# Patient Record
Sex: Female | Born: 1965 | Race: Black or African American | Hispanic: No | Marital: Married | Smoking: Never smoker
Health system: Southern US, Community
[De-identification: ages and names within clinical notes are randomized; demographics above are authoritative.]

## PROBLEM LIST (undated history)

## (undated) DIAGNOSIS — J302 Other seasonal allergic rhinitis: Secondary | ICD-10-CM

## (undated) DIAGNOSIS — K219 Gastro-esophageal reflux disease without esophagitis: Secondary | ICD-10-CM

## (undated) DIAGNOSIS — I10 Essential (primary) hypertension: Secondary | ICD-10-CM

## (undated) DIAGNOSIS — K279 Peptic ulcer, site unspecified, unspecified as acute or chronic, without hemorrhage or perforation: Secondary | ICD-10-CM

## (undated) DIAGNOSIS — K760 Fatty (change of) liver, not elsewhere classified: Secondary | ICD-10-CM

## (undated) DIAGNOSIS — T7840XA Allergy, unspecified, initial encounter: Secondary | ICD-10-CM

## (undated) DIAGNOSIS — K824 Cholesterolosis of gallbladder: Secondary | ICD-10-CM

## (undated) DIAGNOSIS — R413 Other amnesia: Secondary | ICD-10-CM

## (undated) HISTORY — DX: Fatty (change of) liver, not elsewhere classified: K76.0

## (undated) HISTORY — DX: Cholesterolosis of gallbladder: K82.4

## (undated) HISTORY — PX: REDUCTION MAMMAPLASTY: SUR839

## (undated) HISTORY — DX: Allergy, unspecified, initial encounter: T78.40XA

## (undated) HISTORY — DX: Other seasonal allergic rhinitis: J30.2

## (undated) HISTORY — DX: Other amnesia: R41.3

## (undated) HISTORY — DX: Essential (primary) hypertension: I10

## (undated) HISTORY — DX: Peptic ulcer, site unspecified, unspecified as acute or chronic, without hemorrhage or perforation: K27.9

## (undated) HISTORY — PX: ABDOMINAL HYSTERECTOMY: SHX81

## (undated) HISTORY — PX: PARTIAL HYSTERECTOMY: SHX80

## (undated) HISTORY — DX: Gastro-esophageal reflux disease without esophagitis: K21.9

---

## 1998-03-27 ENCOUNTER — Ambulatory Visit (HOSPITAL_BASED_OUTPATIENT_CLINIC_OR_DEPARTMENT_OTHER): Admission: RE | Admit: 1998-03-27 | Discharge: 1998-03-27 | Payer: Self-pay | Admitting: Specialist

## 2002-07-13 ENCOUNTER — Emergency Department (HOSPITAL_COMMUNITY): Admission: EM | Admit: 2002-07-13 | Discharge: 2002-07-13 | Payer: Self-pay | Admitting: *Deleted

## 2004-03-19 ENCOUNTER — Ambulatory Visit (HOSPITAL_COMMUNITY): Admission: RE | Admit: 2004-03-19 | Discharge: 2004-03-19 | Payer: Self-pay | Admitting: General Surgery

## 2005-01-07 HISTORY — PX: BREAST REDUCTION SURGERY: SHX8

## 2006-07-30 ENCOUNTER — Emergency Department (HOSPITAL_COMMUNITY): Admission: EM | Admit: 2006-07-30 | Discharge: 2006-07-30 | Payer: Self-pay | Admitting: Emergency Medicine

## 2007-03-24 ENCOUNTER — Emergency Department (HOSPITAL_COMMUNITY): Admission: EM | Admit: 2007-03-24 | Discharge: 2007-03-24 | Payer: Self-pay | Admitting: Emergency Medicine

## 2007-04-15 ENCOUNTER — Emergency Department (HOSPITAL_COMMUNITY): Admission: EM | Admit: 2007-04-15 | Discharge: 2007-04-15 | Payer: Self-pay | Admitting: Emergency Medicine

## 2008-10-20 ENCOUNTER — Emergency Department (HOSPITAL_COMMUNITY): Admission: EM | Admit: 2008-10-20 | Discharge: 2008-10-20 | Payer: Self-pay | Admitting: Emergency Medicine

## 2009-01-07 DIAGNOSIS — K279 Peptic ulcer, site unspecified, unspecified as acute or chronic, without hemorrhage or perforation: Secondary | ICD-10-CM

## 2009-01-07 HISTORY — DX: Peptic ulcer, site unspecified, unspecified as acute or chronic, without hemorrhage or perforation: K27.9

## 2009-11-22 ENCOUNTER — Ambulatory Visit: Payer: Self-pay | Admitting: Cardiology

## 2009-11-22 ENCOUNTER — Observation Stay (HOSPITAL_COMMUNITY)
Admission: EM | Admit: 2009-11-22 | Discharge: 2009-11-24 | Payer: Self-pay | Source: Home / Self Care | Admitting: Emergency Medicine

## 2009-11-23 ENCOUNTER — Encounter (INDEPENDENT_AMBULATORY_CARE_PROVIDER_SITE_OTHER): Payer: Self-pay | Admitting: Internal Medicine

## 2009-11-23 ENCOUNTER — Ambulatory Visit: Payer: Self-pay | Admitting: Internal Medicine

## 2009-11-23 HISTORY — PX: ESOPHAGOGASTRODUODENOSCOPY: SHX1529

## 2010-01-07 DIAGNOSIS — K824 Cholesterolosis of gallbladder: Secondary | ICD-10-CM

## 2010-01-07 HISTORY — DX: Cholesterolosis of gallbladder: K82.4

## 2010-01-28 ENCOUNTER — Encounter: Payer: Self-pay | Admitting: General Surgery

## 2010-03-20 LAB — BASIC METABOLIC PANEL
CO2: 28 mEq/L (ref 19–32)
Calcium: 9.8 mg/dL (ref 8.4–10.5)
Chloride: 104 mEq/L (ref 96–112)
Creatinine, Ser: 0.95 mg/dL (ref 0.4–1.2)
Creatinine, Ser: 1.1 mg/dL (ref 0.4–1.2)
GFR calc Af Amer: >60 mL/min (ref >60)
GFR calc Af Amer: >60 mL/min (ref >60)
GFR calc non Af Amer: >60 mL/min (ref >60)
Potassium: 3.3 mEq/L — ABNORMAL LOW (ref 3.5–5.1)
Sodium: 139 mEq/L (ref 135–145)

## 2010-03-20 LAB — CARDIAC PANEL(CRET KIN+CKTOT+MB+TROPI)
CK, MB: 0.8 ng/mL (ref 0.3–4.0)
Relative Index: 0.3 (ref 0.0–2.5)
Relative Index: 0.4 (ref 0.0–2.5)
Total CK: 200 U/L — ABNORMAL HIGH (ref 7–177)
Troponin I: 0.02 ng/mL (ref 0.00–0.06)
Troponin I: 0.04 ng/mL (ref 0.00–0.06)
Troponin I: 0.04 ng/mL (ref 0.00–0.06)

## 2010-03-20 LAB — CBC
HCT: 33.7 % — ABNORMAL LOW (ref 36.0–46.0)
Hemoglobin: 13.2 g/dL (ref 12.0–15.0)
MCH: 28.1 pg (ref 26.0–34.0)
MCHC: 33.6 g/dL (ref 30.0–36.0)
MCV: 83.6 fL (ref 78.0–100.0)
Platelets: 230 10*3/uL (ref 150–400)
Platelets: 254 10*3/uL (ref 150–400)
RBC: 4.72 MIL/uL (ref 3.87–5.11)
RDW: 12 % (ref 11.5–15.5)

## 2010-03-20 LAB — DIFFERENTIAL
Basophils Absolute: 0 10*3/uL (ref 0.0–0.1)
Basophils Relative: 1 % (ref 0–1)
Basophils Relative: 1 % (ref 0–1)
Lymphocytes Relative: 36 % (ref 12–46)
Lymphs Abs: 1.9 10*3/uL (ref 0.7–4.0)
Monocytes Relative: 10 % (ref 3–12)
Monocytes Relative: 11 % (ref 3–12)
Neutro Abs: 1.8 10*3/uL (ref 1.7–7.7)
Neutro Abs: 2 10*3/uL (ref 1.7–7.7)
Neutrophils Relative %: 39 % — ABNORMAL LOW (ref 43–77)
Neutrophils Relative %: 46 % (ref 43–77)

## 2010-03-20 LAB — COMPREHENSIVE METABOLIC PANEL
Albumin: 3.5 g/dL (ref 3.5–5.2)
Alkaline Phosphatase: 45 U/L (ref 39–117)
BUN: 13 mg/dL (ref 6–23)
Calcium: 8.5 mg/dL (ref 8.4–10.5)
Creatinine, Ser: 0.91 mg/dL (ref 0.4–1.2)
Glucose, Bld: 121 mg/dL — ABNORMAL HIGH (ref 70–99)
Potassium: 3.7 mEq/L (ref 3.5–5.1)
Total Protein: 6.4 g/dL (ref 6.0–8.3)

## 2010-03-20 LAB — HEPATIC FUNCTION PANEL
Albumin: 3.7 g/dL (ref 3.5–5.2)
Alkaline Phosphatase: 49 U/L (ref 39–117)
Bilirubin, Direct: 0.1 mg/dL (ref 0.0–0.3)
Indirect Bilirubin: 1 mg/dL — ABNORMAL HIGH (ref 0.3–0.9)
Total Bilirubin: 1.1 mg/dL (ref 0.3–1.2)

## 2010-03-20 LAB — LIPASE, BLOOD: Lipase: 35 U/L (ref 11–59)

## 2010-03-20 LAB — TSH: TSH: 2.58 u[IU]/mL (ref 0.350–4.500)

## 2010-03-20 LAB — POCT CARDIAC MARKERS
CKMB, poc: <1 ng/mL — ABNORMAL LOW (ref 1.0–8.0)
Myoglobin, poc: 87.2 ng/mL (ref 12–200)

## 2010-03-20 LAB — D-DIMER, QUANTITATIVE: D-Dimer, Quant: 0.22 ug/mL-FEU (ref 0.00–0.48)

## 2010-04-12 LAB — URINALYSIS, ROUTINE W REFLEX MICROSCOPIC
Glucose, UA: NEGATIVE mg/dL
Leukocytes, UA: NEGATIVE
pH: 6 (ref 5.0–8.0)

## 2010-04-12 LAB — URINE MICROSCOPIC-ADD ON

## 2011-07-17 ENCOUNTER — Ambulatory Visit (INDEPENDENT_AMBULATORY_CARE_PROVIDER_SITE_OTHER): Payer: Managed Care, Other (non HMO) | Admitting: Family Medicine

## 2011-07-17 ENCOUNTER — Encounter: Payer: Self-pay | Admitting: Family Medicine

## 2011-07-17 VITALS — BP 130/96 | HR 97 | Resp 16 | Ht 60.0 in | Wt 161.4 lb

## 2011-07-17 DIAGNOSIS — D649 Anemia, unspecified: Secondary | ICD-10-CM

## 2011-07-17 DIAGNOSIS — R232 Flushing: Secondary | ICD-10-CM

## 2011-07-17 DIAGNOSIS — I10 Essential (primary) hypertension: Secondary | ICD-10-CM

## 2011-07-17 DIAGNOSIS — N951 Menopausal and female climacteric states: Secondary | ICD-10-CM

## 2011-07-17 DIAGNOSIS — E669 Obesity, unspecified: Secondary | ICD-10-CM

## 2011-07-17 LAB — LIPID PANEL
HDL: 52 mg/dL (ref >39)
LDL Cholesterol: 132 mg/dL — ABNORMAL HIGH (ref 0–99)
Total CHOL/HDL Ratio: 4 Ratio
Triglycerides: 123 mg/dL (ref <150)

## 2011-07-17 LAB — CBC
HCT: 45.3 % (ref 36.0–46.0)
MCV: 80.7 fL (ref 78.0–100.0)
Platelets: 337 10*3/uL (ref 150–400)
RBC: 5.61 MIL/uL — ABNORMAL HIGH (ref 3.87–5.11)
RDW: 13.6 % (ref 11.5–15.5)
WBC: 3.8 10*3/uL — ABNORMAL LOW (ref 4.0–10.5)

## 2011-07-17 LAB — COMPREHENSIVE METABOLIC PANEL
ALT: 158 U/L — ABNORMAL HIGH (ref 0–35)
Calcium: 10.7 mg/dL — ABNORMAL HIGH (ref 8.4–10.5)
Potassium: 4.3 mEq/L (ref 3.5–5.3)
Sodium: 135 mEq/L (ref 135–145)

## 2011-07-17 MED ORDER — LISINOPRIL-HYDROCHLOROTHIAZIDE 10-12.5 MG PO TABS: 1.0000 | ORAL_TABLET | Freq: Every day | ORAL | Status: DC

## 2011-07-17 NOTE — Assessment & Plan Note (Signed)
Hold on estrogen replacement, discussed MVI and Estroven vitamin which has some black cohosh in it with other essential vitamins

## 2011-07-17 NOTE — Patient Instructions (Addendum)
F/U 4 week  For blood pressure  We will call with lab results Start new blood pressure medication  Vitamin D 800IU  Calcium 1200mg = Or try Jeffie Pollock

## 2011-07-17 NOTE — Assessment & Plan Note (Signed)
Check CBC 

## 2011-07-17 NOTE — Progress Notes (Signed)
  Subjective:    Patient ID: Adriana Ramsey, female    DOB: [birth year], 46 y.o.   MRN: [id number]  HPI Pt here to establish care, last PCP Dr. Loleta Chance > 4 years ago History of HTN was put on meds for a few months by her GYN but then had no follow-up. Has had increased stress at work- works at [employer], she is the [employer], she noticed her blood pressure has been elevated and she has been having Headaches, last week BP was 167/106. In the past was on maxide.  EMR reviewed, pt also has history of gastric ulcers secondary to NSAID use , hiatal hernia and mild anemia She currently is suffering from hot flashes for past year on and off. S/p hysterectomy> 10 years ago. Overdue for physical and Mammogram    Review of Systems  GEN- denies fatigue, fever, weight loss,weakness, recent illness HEENT- denies eye drainage, change in vision, nasal discharge, CVS- denies chest pain, palpitations RESP- denies SOB, cough, wheeze ABD- denies N/V, change in stools, abd pain GU- denies dysuria, hematuria, dribbling, incontinence MSK- denies joint pain, muscle aches, injury Neuro- + headache, dizziness, syncope, seizure activity       Objective:   Physical Exam GEN- NAD, alert and oriented x3 HEENT- PERRL, EOMI, non injected sclera, pink conjunctiva, MMM, oropharynx clear Neck- Supple, no thyromegaly CVS- RRR, no murmur RESP-CTAB ABD-NABS,soft,NT,ND EXT- No edema Pulses- Radial, DP- 2+ EKG-NSR, non specific T wave change- flipped t wave AVL        Assessment & Plan:

## 2011-07-17 NOTE — Assessment & Plan Note (Signed)
She wants to try a diet pill, baseline labs to be done, when BP is controlled she may be a good candidate for phentermine, discussed need for healthy exercise and diet

## 2011-07-17 NOTE — Assessment & Plan Note (Signed)
Start lisinopril HCTZ 

## 2011-07-19 ENCOUNTER — Telehealth: Payer: Self-pay | Admitting: Family Medicine

## 2011-07-19 DIAGNOSIS — R748 Abnormal levels of other serum enzymes: Secondary | ICD-10-CM

## 2011-07-19 NOTE — Telephone Encounter (Signed)
I have spoken with pt she tells me she has been taking upwards of 9 capusules of tylenol a day over the past few weeks for headache Labs to be done Stop acetaminophen Ultrasound to be done

## 2011-07-20 LAB — HEPATITIS B SURFACE ANTIGEN: Hepatitis B Surface Ag: NEGATIVE

## 2011-07-20 LAB — HEPATITIS C ANTIBODY: HCV Ab: NEGATIVE

## 2011-07-20 LAB — COMPREHENSIVE METABOLIC PANEL
BUN: 16 mg/dL (ref 6–23)
CO2: 28 mEq/L (ref 19–32)
Calcium: 10.1 mg/dL (ref 8.4–10.5)
Chloride: 101 mEq/L (ref 96–112)
Creat: 0.8 mg/dL (ref 0.50–1.10)
Total Bilirubin: 0.6 mg/dL (ref 0.3–1.2)

## 2011-07-20 LAB — HEPATITIS A ANTIBODY, IGM: Hep A IgM: NEGATIVE

## 2011-07-20 LAB — PROTIME-INR: Prothrombin Time: 12.8 seconds (ref 11.6–15.2)

## 2011-07-22 ENCOUNTER — Telehealth: Payer: Self-pay | Admitting: Family Medicine

## 2011-07-22 DIAGNOSIS — R748 Abnormal levels of other serum enzymes: Secondary | ICD-10-CM

## 2011-07-22 DIAGNOSIS — R7301 Impaired fasting glucose: Secondary | ICD-10-CM

## 2011-07-22 NOTE — Telephone Encounter (Signed)
Spoke with pt and she is aware of lab results and well as the need to go and have repeat labs drawn.

## 2011-07-22 NOTE — Telephone Encounter (Signed)
Please give message on labs CMET/A1C to be done Friday

## 2011-07-26 ENCOUNTER — Telehealth: Payer: Self-pay | Admitting: Family Medicine

## 2011-07-26 LAB — HEPATIC FUNCTION PANEL
ALT: 77 U/L — ABNORMAL HIGH (ref 0–35)
AST: 39 U/L — ABNORMAL HIGH (ref 0–37)
Alkaline Phosphatase: 77 U/L (ref 39–117)
Indirect Bilirubin: 0.4 mg/dL (ref 0.0–0.9)
Total Protein: 7.4 g/dL (ref 6.0–8.3)

## 2011-07-26 MED ORDER — HYDROCHLOROTHIAZIDE 25 MG PO TABS: 25.0000 mg | ORAL_TABLET | Freq: Every day | ORAL | Status: DC

## 2011-07-26 NOTE — Telephone Encounter (Signed)
Pt called after hours line, I had not called her. She has labs drawn She has a dry hacking cough with lisinopril, she will finish out the current pack, I will send over HCTZ 25mg  instead

## 2011-07-31 ENCOUNTER — Encounter: Payer: Self-pay | Admitting: Family Medicine

## 2011-08-02 ENCOUNTER — Ambulatory Visit (HOSPITAL_COMMUNITY): Admission: RE | Admit: 2011-08-02 | Payer: Managed Care, Other (non HMO) | Source: Ambulatory Visit

## 2011-08-15 ENCOUNTER — Ambulatory Visit: Payer: Self-pay | Admitting: Family Medicine

## 2011-10-04 ENCOUNTER — Telehealth: Payer: Self-pay | Admitting: Family Medicine

## 2011-10-09 NOTE — Telephone Encounter (Signed)
Has appt on fri

## 2011-10-11 ENCOUNTER — Encounter: Payer: Self-pay | Admitting: Family Medicine

## 2011-10-11 ENCOUNTER — Ambulatory Visit (INDEPENDENT_AMBULATORY_CARE_PROVIDER_SITE_OTHER): Payer: Managed Care, Other (non HMO) | Admitting: Family Medicine

## 2011-10-11 VITALS — BP 120/82 | HR 90 | Resp 15 | Ht 60.0 in | Wt 162.0 lb

## 2011-10-11 DIAGNOSIS — I10 Essential (primary) hypertension: Secondary | ICD-10-CM

## 2011-10-11 DIAGNOSIS — E669 Obesity, unspecified: Secondary | ICD-10-CM

## 2011-10-11 DIAGNOSIS — R7989 Other specified abnormal findings of blood chemistry: Secondary | ICD-10-CM | POA: Insufficient documentation

## 2011-10-11 DIAGNOSIS — T781XXA Other adverse food reactions, not elsewhere classified, initial encounter: Secondary | ICD-10-CM

## 2011-10-11 DIAGNOSIS — Z91018 Allergy to other foods: Secondary | ICD-10-CM

## 2011-10-11 LAB — COMPREHENSIVE METABOLIC PANEL
AST: 43 U/L — ABNORMAL HIGH (ref 0–37)
Alkaline Phosphatase: 71 U/L (ref 39–117)
BUN: 13 mg/dL (ref 6–23)
Calcium: 9.6 mg/dL (ref 8.4–10.5)
Creat: 0.7 mg/dL (ref 0.50–1.10)
Total Bilirubin: 0.5 mg/dL (ref 0.3–1.2)

## 2011-10-11 MED ORDER — LISINOPRIL-HYDROCHLOROTHIAZIDE 10-12.5 MG PO TABS: 1.0000 | ORAL_TABLET | Freq: Every day | ORAL | Status: DC

## 2011-10-11 MED ORDER — EPINEPHRINE 0.3 MG/0.3ML IJ DEVI: 0.3000 mg | Freq: Once | INTRAMUSCULAR | Status: DC

## 2011-10-11 NOTE — Assessment & Plan Note (Signed)
Repeat LFT, if still elevated pt understands ultrasound will be needed, Hepatitis labs normal

## 2011-10-11 NOTE — Progress Notes (Signed)
  Subjective:    Patient ID: Adriana Ramsey, female    DOB: 10-30-1965, 46 y.o.   MRN: [id number]  HPI Pt here to f/u chronic medical problems. Treated for bronchitis at urgent care with Zpak Still has mild cough. She was started on lisinopril HCTZ however had dry cough therefore was told to stop his intake HCTZ 25 mg. She however that the lisinopril was doing well and decided to continue taking it. Previously elevated liver function tests which were trending down and she stopped the use of acetaminophen. Should not have liver ultrasound done and has not had a repeat labs She's a history of food allergies however she cannot pinpoint specifics as she is able to eat strawberries but has allergy to strawberries soda or strawberry preservatives she's also had instances with spaghetti sauce and seafood. She typically takes Benadryl before she eats new things as she is unsure when she will get facial and lip swelling. She is asking for EpiPen today.  Review of Systems  GEN- denies fatigue, fever, weight loss,weakness, recent illness HEENT- denies eye drainage, change in vision, nasal discharge, CVS- denies chest pain, palpitations RESP- denies SOB,+ cough, wheeze ABD- denies N/V, change in stools, abd pain GU- denies dysuria, hematuria, dribbling, incontinence MSK- denies joint pain, muscle aches, injury Neuro- denies headache, dizziness, syncope, seizure activity      Objective:   Physical Exam  GEN- NAD, alert and oriented x3 HEENT- PERRL, EOMI, non injected sclera, pink conjunctiva, MMM, oropharynx clear Neck- Supple, no LAD CVS- RRR, no murmur RESP-CTAB ABD-NABS,soft,no HSM,NT EXT- No edema Pulses- Radial, DP- 2+       Assessment & Plan:

## 2011-10-11 NOTE — Assessment & Plan Note (Signed)
Well-controlled we'll continue lisinopril HCTZ at patient request even though she's continues to have a cough every now and then, she states it is much improved

## 2011-10-11 NOTE — Assessment & Plan Note (Signed)
Unchanged, encourage exercise 

## 2011-10-11 NOTE — Patient Instructions (Signed)
Continue blood pressure pill Epi pen prescribed Get flu shot at work Labs today for liver F/U January for physical

## 2011-10-11 NOTE — Assessment & Plan Note (Signed)
Difficult as he only has allergies to certain brands of foods however she never knows when she will have facial swelling. I advised her to keep Benadryl with her at all times will use an EpiPen. At this time she wishes to hold on allergy testing

## 2011-10-15 NOTE — Addendum Note (Signed)
Addended by: Milinda Antis F on: 10/15/2011 10:37 AM   Modules accepted: Orders

## 2011-10-17 ENCOUNTER — Ambulatory Visit (HOSPITAL_COMMUNITY): Payer: Managed Care, Other (non HMO) | Attending: Family Medicine

## 2011-10-29 ENCOUNTER — Telehealth: Payer: Self-pay

## 2011-10-29 MED ORDER — PANTOPRAZOLE SODIUM 40 MG PO TBEC: 40.0000 mg | DELAYED_RELEASE_TABLET | Freq: Every day | ORAL | Status: DC

## 2011-10-29 NOTE — Telephone Encounter (Signed)
Noted  

## 2011-10-29 NOTE — Telephone Encounter (Signed)
meds sent

## 2011-10-29 NOTE — Telephone Encounter (Signed)
Pt wants an rx for protonix sent to [facility]. She used to take it but ran out and forgot to ask you for more when she was here.

## 2011-11-01 ENCOUNTER — Ambulatory Visit (HOSPITAL_COMMUNITY)
Admission: RE | Admit: 2011-11-01 | Discharge: 2011-11-01 | Disposition: A | Discharge status: Home / Self Care | Payer: Managed Care, Other (non HMO) | Source: Ambulatory Visit | Attending: Family Medicine | Admitting: Family Medicine

## 2011-11-01 ENCOUNTER — Other Ambulatory Visit: Payer: Self-pay | Admitting: Family Medicine

## 2011-11-01 DIAGNOSIS — R7989 Other specified abnormal findings of blood chemistry: Secondary | ICD-10-CM | POA: Insufficient documentation

## 2011-11-01 DIAGNOSIS — I10 Essential (primary) hypertension: Secondary | ICD-10-CM | POA: Insufficient documentation

## 2011-11-01 DIAGNOSIS — K824 Cholesterolosis of gallbladder: Secondary | ICD-10-CM

## 2011-11-07 ENCOUNTER — Encounter: Payer: Self-pay | Admitting: Internal Medicine

## 2011-11-11 ENCOUNTER — Telehealth: Payer: Self-pay | Admitting: Family Medicine

## 2011-11-11 NOTE — Telephone Encounter (Signed)
Patient aware.

## 2011-11-11 NOTE — Telephone Encounter (Signed)
Called and left voicemail for patient to return call as to why GI referral has been made.

## 2011-11-14 ENCOUNTER — Encounter: Payer: Self-pay | Admitting: Internal Medicine

## 2011-11-15 ENCOUNTER — Telehealth: Payer: Self-pay | Admitting: *Deleted

## 2011-11-15 ENCOUNTER — Ambulatory Visit: Payer: Managed Care, Other (non HMO) | Admitting: Urgent Care

## 2011-11-15 NOTE — Telephone Encounter (Signed)
Pt was a no show

## 2011-11-15 NOTE — Telephone Encounter (Signed)
Please attempt to reschedule

## 2011-11-18 NOTE — Telephone Encounter (Signed)
Left message for pt to call and reschedule

## 2011-11-27 ENCOUNTER — Ambulatory Visit: Payer: Managed Care, Other (non HMO) | Admitting: Urgent Care

## 2011-11-29 ENCOUNTER — Ambulatory Visit (INDEPENDENT_AMBULATORY_CARE_PROVIDER_SITE_OTHER): Payer: Managed Care, Other (non HMO) | Admitting: Urgent Care

## 2011-11-29 ENCOUNTER — Encounter: Payer: Self-pay | Admitting: Urgent Care

## 2011-11-29 VITALS — BP 133/87 | HR 87 | Temp 98.2°F | Ht 60.0 in | Wt 160.8 lb

## 2011-11-29 DIAGNOSIS — R1310 Dysphagia, unspecified: Secondary | ICD-10-CM

## 2011-11-29 DIAGNOSIS — K824 Cholesterolosis of gallbladder: Secondary | ICD-10-CM | POA: Insufficient documentation

## 2011-11-29 DIAGNOSIS — K7689 Other specified diseases of liver: Secondary | ICD-10-CM

## 2011-11-29 DIAGNOSIS — K760 Fatty (change of) liver, not elsewhere classified: Secondary | ICD-10-CM | POA: Insufficient documentation

## 2011-11-29 MED ORDER — DEXLANSOPRAZOLE 60 MG PO CPDR: 60.0000 mg | DELAYED_RELEASE_CAPSULE | Freq: Every day | ORAL | Status: DC

## 2011-11-29 NOTE — Assessment & Plan Note (Signed)
Chronic GERD with breakthrough symptoms on Protonix 40 mg daily. She also has mild, intermittent solid food dysphagia in setting of known Schatzki's ring.   TRIAL DEXILANT 60mg  daily Stop Protonix. If still with reflux & trouble swallowing, you will need an EGD (upper endoscopy) & dilation of your esophagus.  Please call to arrange. Office visit in 3 months

## 2011-11-29 NOTE — Progress Notes (Signed)
Referring Provider: Salley Scarlet, MD Primary Care Physician:  Milinda Antis, MD Primary Gastroenterologist:  Dr. Jonette Eva  Chief Complaint  Patient presents with  . abnormal LFT'S    Dysphagia (Schatzki's ring), GERD, gallbladder polyp    HPI:  Adriana Ramsey is a 46 y.o. female here as a referral from Dr. Jeanice Lim for elevated LFTS.  She gives history of taking too much tylenol.  She was taking 4 or more 500mg  tablets daily for 1 week for right shoulder pain.  She was seen by Dr Jeanice Lim to establish care.  She rated the shoulder pain to be 7/10 & describes it as intermittent.  Her liver function tests were incidentally found to be elevated.  She was in found to have a 3 mm gallbladder polyp on abdominal ultrasound.  There is no family history of liver disease.  She has been having some heartburn & chest pain with globus despite Protonix 40 mg daily for the last 2 years.  She has history of peptic ulcer disease, hiatal hernia, and Schatzki's ring on last EGD by Dr. Karilyn Cota in 2011. At that time she was taking ibuprofen frequently. Her weight has been stable over the last year. She denies any history of diabetes.  Her appetite has been fine.  Denies fever or chills.  She has had intermittent nausea & belching. Denies vomiting. Feels like her Protonix 40mg  daily he is really not working anymore. She does have mild intermittent solid food dysphagia.  Denies regurgitation. Denies odynophagia. Denies any problems with liquids. She has had very rare episodes of dysphagia. BMs are normal.  Denies rectal bleeding, melena, or clay colored stools.  Denies hx of jaundice.  She has had 1 blood transfusion at time of hysterectomy.  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  She has never been told she had hepatits.  Hgb a1c 5.9, acet level <15, Hep BsAg negative, HCV ab negative, Hep A IgM negative in July 2013.  ABD US->Probable 3 mm gallbladder polyp.  Question mild  fatty infiltration of liver.  Component     Latest Ref Rng 11/22/2009 11/23/2009 11/24/2009 07/17/2011  Total Protein     6.0 - 8.3 g/dL  6.8 6.4 8.6 (H)  Albumin     3.5 - 5.2 g/dL  3.7 3.5 5.2  AST     0 - 37 U/L  24 25 100 (H)  ALT     0 - 35 U/L  40 (H) 34 158 (H)  Alkaline Phosphatase     39 - 117 U/L  49 45 95  Total Bilirubin     0.3 - 1.2 mg/dL  1.1 0.4 0.6   Component     Latest Ref Rng 07/19/2011 07/26/2011 10/11/2011  Total Protein     6.0 - 8.3 g/dL 7.6 7.4 7.1  Albumin     3.5 - 5.2 g/dL 4.7 4.5 4.3  AST     0 - 37 U/L 52 (H) 39 (H) 43 (H)  ALT     0 - 35 U/L 113 (H) 77 (H) 60 (H)  Alkaline Phosphatase     39 - 117 U/L 92 77 71  Total Bilirubin     0.3 - 1.2 mg/dL 0.6 0.5 0.5    Past Medical History  Diagnosis Date  . Hypertension   . PUD (peptic ulcer disease) 2011  . Allergy   . GERD (gastroesophageal reflux disease)     Past Surgical  History  Procedure Date  . Breast reduction surgery 2007  . Abdominal hysterectomy     partial   . Cesarean section     2  . Esophagogastroduodenoscopy 11/23/2009    NUR: hypopharyngeal erosions of unclear sidnificance/mild changes of reflux /small sliding hiatal hernia and non Schatzki ring/3-mm gastric ulcer along the antrum with 2 scars and few erosions    Current Outpatient Prescriptions  Medication Sig Dispense Refill  . EPINEPHrine (EPI-PEN) 0.3 mg/0.3 mL DEVI Inject 0.3 mLs (0.3 mg total) into the muscle once.  2 Device  1  . lisinopril-hydrochlorothiazide (PRINZIDE,ZESTORETIC) 10-12.5 MG per tablet Take 1 tablet by mouth daily.  30 tablet  6  . pantoprazole (PROTONIX) 40 MG tablet Take 1 tablet (40 mg total) by mouth daily.  30 tablet  3  . dexlansoprazole (DEXILANT) 60 MG capsule Take 1 capsule (60 mg total) by mouth daily.  14 capsule  0    Allergies as of 11/29/2011 - Review Complete 11/29/2011  Allergen Reaction Noted  . Sulfur Swelling 07/17/2011    Family History:There is no known family history  of colorectal carcinoma , liver disease, or inflammatory bowel disease.  Problem Relation Age of Onset  . Hyperlipidemia Mother   . Seizures Mother     History   Social History  . Marital Status: Married    Spouse Name: N/A    Number of Children: 2  . Years of Education: N/A   Occupational History  . billing specialist [facility]    Social History Main Topics  . Smoking status: Never Smoker   . Smokeless tobacco: Not on file  . Alcohol Use: Yes     Comment: wine 1 glass per week  . Drug Use: No  . Sexually Active: Yes -- Female partner(s)   Other Topics Concern  . Not on file   Social History Narrative   Lives w/ husband & 1 son, 1 grown daughter    Review of Systems: Gen: See history of present illness CV: Denies chest pain, angina, palpitations, syncope, orthopnea, PND, peripheral edema, and claudication. Resp: Denies dyspnea at rest, dyspnea with exercise, cough, sputum, wheezing, coughing up blood, and pleurisy. GI: Denies vomiting blood, jaundice, and fecal incontinence.    GU : Denies urinary burning, blood in urine, urinary frequency, urinary hesitancy, nocturnal urination, and urinary incontinence. MS: see HPI Derm: Denies rash, itching, dry skin, hives, moles, warts, or unhealing ulcers.  Psych: Denies depression, anxiety, memory loss, suicidal ideation, hallucinations, paranoia, and confusion. Heme: Denies bruising, bleeding, and enlarged lymph nodes. Neuro:  Denies any headaches, dizziness, paresthesias. Endo:  Denies any problems with DM, thyroid, adrenal function.  Physical Exam: BP 133/87  Pulse 87  Temp 98.2 F (36.8 C) (Temporal)  Ht 5' (1.524 m)  Wt 160 lb 12.8 oz (72.938 kg)  BMI 31.40 kg/m2 No LMP recorded. Patient has had a hysterectomy. General:   Alert,  Well-developed, well-nourished, pleasant and cooperative in NAD Head:  Normocephalic and atraumatic. Eyes:  Sclera clear, no icterus.   Conjunctiva pink. Ears:  Normal auditory acuity. Nose:   No deformity, discharge, or lesions. Mouth:  No deformity or lesions,oropharynx pink & moist. Neck:  Supple; no masses or thyromegaly. Lungs:  Clear throughout to auscultation.   No wheezes, crackles, or rhonchi. No acute distress. Heart:  Regular rate and rhythm; no murmurs, clicks, rubs,  or gallops. Abdomen:  Normal bowel sounds.  No bruits.  Soft, non-tender and non-distended without masses, hepatosplenomegaly or hernias noted.  No  guarding or rebound tenderness.   Rectal:  Deferred. Msk:  Symmetrical without gross deformities. Normal posture. Pulses:  Normal pulses noted. Extremities:  No clubbing or edema. Neurologic:  Alert and oriented x4;  grossly normal neurologically. Skin:  Intact without significant lesions or rashes. Lymph Nodes:  No significant cervical adenopathy. Psych:  Alert and cooperative. Normal mood and affect.

## 2011-11-29 NOTE — Progress Notes (Signed)
Faxed to PCP

## 2011-11-29 NOTE — Assessment & Plan Note (Addendum)
Adriana Ramsey is a pleasant 46 y.o. 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  She's had a chronic mild transaminitis. Abdominal ultrasound shows fatty liver.  Given her mild elevations, I doubt this is due to autoimmune process or other less common liver disease.  Hepatitis panel negative in July. Will follow LFTs and provide aggressive management for fatty liver. She also has significant use of acetaminophen in the past and she was counseled regarding appropriate use of acetaminophen dosing.  Repeat LFTs in 2 months Instructions for fatty liver: Recommend 1-2# weight loss per week until ideal body weight through exercise & diet. Low fat/cholesterol diet.   Avoid sweets, sodas, fruit juices, sweetened beverages like tea, etc. Gradually increase exercise from 15 min daily up to 1 hr per day 5 days/week. Limit alcohol use.

## 2011-11-29 NOTE — Assessment & Plan Note (Signed)
3 mm gallbladder polyp on ultrasound. Given the small size, likely benign lesion. Per current guidelines, will followup in 6 months with an ultrasound to ensure stability. Patient reassured.  ABD [place] 6 mo

## 2011-11-29 NOTE — Patient Instructions (Addendum)
Abdominal ultrasound in 6 months to follow up on gallbladder polyp Repeat LFTs in 2 months Instructions for fatty liver: Recommend 1-2# weight loss per week until ideal body weight through exercise & diet. Low fat/cholesterol diet.   Avoid sweets, sodas, fruit juices, sweetened beverages like tea, etc. Gradually increase exercise from 15 min daily up to 1 hr per day 5 days/week. Limit alcohol use. TRIAL DEXILANT 60mg  daily Stop Protonix. If still with reflux & trouble swallowing, you will need an EGD (upper endoscopy) & dilation of your esophagus.  Please call to arrange. Office visit in 3 months  Fatty Liver Fatty liver is the accumulation of fat in liver cells. It is also called hepatosteatosis or steatohepatitis. It is normal for your liver to contain some fat. If fat is more than 5 to 10% of your liver's weight, you have fatty liver.  There are often no symptoms (problems) for years while damage is still occurring. People often learn about their fatty liver when they have medical tests for other reasons. Fat can damage your liver for years or even decades without causing problems. When it becomes severe, it can cause fatigue, weight loss, weakness, and confusion. This makes you more likely to develop more serious liver problems. The liver is the largest organ in the body. It does a lot of work and often gives no warning signs when it is sick until late in a disease. The liver has many important jobs including:  Breaking down foods.  Storing vitamins, iron, and other minerals.  Making proteins.  Making bile for food digestion.  Breaking down many products including medications, alcohol and some poisons. CAUSES  There are a number of different conditions, medications, and poisons that can cause a fatty liver. Eating too many calories causes fat to build up in the liver. Not processing and breaking fats down normally may also cause this. Certain conditions, such as obesity, diabetes,  and high triglycerides also cause this. Most fatty liver patients tend to be middle-aged and over weight.  Some causes of fatty liver are:  Alcohol over consumption.  Malnutrition.  Steroid use.  Valproic acid toxicity.  Obesity.  Cushing's syndrome.  Poisons.  Tetracycline in high dosages.  Pregnancy.  Diabetes.  Hyperlipidemia.  Rapid weight loss. Some people develop fatty liver even having none of these conditions. SYMPTOMS  Fatty liver most often causes no problems. This is called asymptomatic.  It can be diagnosed with blood tests and also by a liver biopsy.  It is one of the most common causes of minor elevations of liver enzymes on routine blood tests.  Specialized Imaging of the liver using ultrasound, CT (computed tomography) scan, or MRI (magnetic resonance imaging) can suggest a fatty liver but a biopsy is needed to confirm it.  A biopsy involves taking a small sample of liver tissue. This is done by using a needle. It is then looked at under a microscope by a specialist. TREATMENT  It is important to treat the cause. Simple fatty liver without a medical reason may not need treatment.  Weight loss, fat restriction, and exercise in overweight patients produces inconsistent results but is worth trying.  Fatty liver due to alcohol toxicity may not improve even with stopping drinking.  Good control of diabetes may reduce fatty liver.  Lower your triglycerides through diet, medication or both.  Eat a balanced, healthy diet.  Increase your physical activity.  Get regular checkups from a liver specialist.  There are no medical or surgical  treatments for a fatty liver or NASH, but improving your diet and increasing your exercise may help prevent or reverse some of the damage. PROGNOSIS  Fatty liver may cause no damage or it can lead to an inflammation of the liver. This is, called steatohepatitis. When it is linked to alcohol abuse, it is called alcoholic  steatohepatitis. It often is not linked to alcohol. It is then called nonalcoholic steatohepatitis, or NASH. Over time the liver may become scarred and hardened. This condition is called cirrhosis. Cirrhosis is serious and may lead to liver failure or cancer. NASH is one of the leading causes of cirrhosis. About 10-20% of Americans have fatty liver and a smaller 2-5% has NASH. Document Released: 02/08/2005 Document Revised: 03/18/2011 Document Reviewed: 04/03/2005 [facility] Patient Information 2013 [place], [place].

## 2011-12-17 ENCOUNTER — Other Ambulatory Visit: Payer: Self-pay | Admitting: Urgent Care

## 2011-12-17 NOTE — Telephone Encounter (Signed)
Patient was given samples of Dexilant and she called to say they have really worked well and she would like a Rx called to [facility] in [place]

## 2011-12-18 ENCOUNTER — Telehealth: Payer: Self-pay | Admitting: Gastroenterology

## 2011-12-18 NOTE — Telephone Encounter (Signed)
Pt said she called yesterday to have her Dexilant rx sent to [facility] in [place] and she said that the pharmacy told her they have not received it from [place] yet. I told her that we would fax it over again and verified it was to the [facility].

## 2011-12-19 ENCOUNTER — Telehealth: Payer: Self-pay

## 2011-12-19 MED ORDER — DEXLANSOPRAZOLE 60 MG PO CPDR: 60.0000 mg | DELAYED_RELEASE_CAPSULE | Freq: Every day | ORAL | Status: DC

## 2011-12-19 NOTE — Telephone Encounter (Signed)
Pt came by the office. She said she had asked for her Rx of Dexilant to go to [facility] in [place] when she was here for OV. It did not go there. ( She works at [employer] but specifically asked for Rx to go to [facility]. I gave her two bottles of samples until we can get this send in for her. Routing to Gerrit Halls, NP in [place] absence.

## 2011-12-19 NOTE — Telephone Encounter (Signed)
Completed.

## 2011-12-19 NOTE — Telephone Encounter (Signed)
LMOM that Rx has been sent.  

## 2011-12-30 ENCOUNTER — Telehealth: Payer: Self-pay

## 2011-12-30 MED ORDER — LANSOPRAZOLE 30 MG PO CPDR: 30.0000 mg | DELAYED_RELEASE_CAPSULE | Freq: Every day | ORAL | Status: DC

## 2011-12-30 NOTE — Telephone Encounter (Signed)
Pt called and said that she needed PA for Dexilant. Raynelle Fanning is working on it. Pt said she has only tried Protonix. Routing to [place].

## 2011-12-30 NOTE — Telephone Encounter (Signed)
LMOM for pt that Rx was sent in and to call if she has questions.

## 2011-12-30 NOTE — Telephone Encounter (Signed)
pts preferred formulary is omeprazole, lansoprazole and nexium. Pt has only tried protonix. Pt will have to try and fail preferred before they will approve dexilant.

## 2011-12-30 NOTE — Telephone Encounter (Signed)
Let's try lansoprazole. RX sent.

## 2011-12-31 ENCOUNTER — Other Ambulatory Visit: Payer: Self-pay

## 2011-12-31 ENCOUNTER — Other Ambulatory Visit: Payer: Self-pay | Admitting: Urgent Care

## 2011-12-31 ENCOUNTER — Telehealth: Payer: Self-pay

## 2011-12-31 DIAGNOSIS — K760 Fatty (change of) liver, not elsewhere classified: Secondary | ICD-10-CM

## 2011-12-31 NOTE — Telephone Encounter (Signed)
Pt called and said [facility] in [place] did not have the prescription for the Prevacid. I called and confirmed with Jonny Ruiz the pharmacist  That they did have it and they had gotten the name mixed up because pt goes by Adriana Ramsey and also Adriana Ramsey. I called pt and informed the prescription was there.

## 2012-01-06 ENCOUNTER — Encounter: Payer: Self-pay | Admitting: *Deleted

## 2012-01-21 ENCOUNTER — Telehealth: Payer: Self-pay | Admitting: Urgent Care

## 2012-01-21 NOTE — Telephone Encounter (Signed)
LMOM ( mobile) to get labs done.

## 2012-01-21 NOTE — Telephone Encounter (Signed)
Please remind pt to get LFTs drawn. Thanks

## 2012-01-31 ENCOUNTER — Encounter: Payer: Self-pay | Admitting: Family Medicine

## 2012-01-31 ENCOUNTER — Ambulatory Visit (INDEPENDENT_AMBULATORY_CARE_PROVIDER_SITE_OTHER): Payer: Managed Care, Other (non HMO) | Admitting: Family Medicine

## 2012-01-31 VITALS — BP 134/90 | HR 86 | Resp 16 | Ht 60.0 in | Wt 167.0 lb

## 2012-01-31 DIAGNOSIS — E785 Hyperlipidemia, unspecified: Secondary | ICD-10-CM

## 2012-01-31 DIAGNOSIS — Z1211 Encounter for screening for malignant neoplasm of colon: Secondary | ICD-10-CM

## 2012-01-31 DIAGNOSIS — E669 Obesity, unspecified: Secondary | ICD-10-CM

## 2012-01-31 DIAGNOSIS — Z1239 Encounter for other screening for malignant neoplasm of breast: Secondary | ICD-10-CM

## 2012-01-31 DIAGNOSIS — K760 Fatty (change of) liver, not elsewhere classified: Secondary | ICD-10-CM

## 2012-01-31 DIAGNOSIS — K7689 Other specified diseases of liver: Secondary | ICD-10-CM

## 2012-01-31 DIAGNOSIS — I10 Essential (primary) hypertension: Secondary | ICD-10-CM

## 2012-01-31 DIAGNOSIS — Z01419 Encounter for gynecological examination (general) (routine) without abnormal findings: Secondary | ICD-10-CM

## 2012-01-31 LAB — HEPATIC FUNCTION PANEL
ALT: 28 U/L (ref 0–35)
AST: 27 U/L (ref 0–37)
Alkaline Phosphatase: 56 U/L (ref 39–117)
Indirect Bilirubin: 0.3 mg/dL (ref 0.0–0.9)
Total Protein: 7.3 g/dL (ref 6.0–8.3)

## 2012-01-31 LAB — POC HEMOCCULT BLD/STL (OFFICE/1-CARD/DIAGNOSTIC)

## 2012-01-31 LAB — LIPID PANEL
HDL: 41 mg/dL (ref >39)
LDL Cholesterol: 122 mg/dL — ABNORMAL HIGH (ref 0–99)

## 2012-01-31 MED ORDER — HYDROCHLOROTHIAZIDE 25 MG PO TABS: 25.0000 mg | ORAL_TABLET | Freq: Every day | ORAL | Status: DC

## 2012-01-31 MED ORDER — LANSOPRAZOLE 30 MG PO CPDR: 30.0000 mg | DELAYED_RELEASE_CAPSULE | Freq: Every day | ORAL | Status: DC

## 2012-01-31 NOTE — Patient Instructions (Addendum)
I recommend eye visit once a year I recommend dental visit every 6 months Goal is to  Exercise 30 minutes 5 days a week We will send a letter with lab results  Call and Schedule your mammogram - number provided Get the cholesterol drawn with your liver labs Stop the lisinopril Start HCTZ 25mg  daily Record blood pressure call if > 140/90 Call back with your pharmacy name F/U 3 months

## 2012-01-31 NOTE — Progress Notes (Signed)
  Subjective:    Patient ID: Adriana Ramsey, female    DOB: 1965/02/19, 47 y.o.   MRN: [id number]  HPI  Pt here for GYN exam. She would like to change her blood pressure medication as the cough is bothering her. She has a dry cough throughout the day. She's on lisinopril HCTZ. She's not been exercising and did not watch her diet over the holidays and has gained some weight  Review of Systems  GEN- denies fatigue, fever, weight loss,weakness, recent illness HEENT- denies eye drainage, change in vision, nasal discharge, CVS- denies chest pain, palpitations RESP- denies SOB, cough, wheeze ABD- denies N/V, change in stools, abd pain GU- denies dysuria, hematuria, dribbling, incontinence MSK- denies joint pain, muscle aches, injury Neuro- denies headache, dizziness, syncope, seizure activity      Objective:   Physical Exam GEN- NAD, alert and oriented x3 HEENT- PERRL, EOMI, non injected sclera, pink conjunctiva, MMM, oropharynx clear, normal fundoscopic exam Neck- supple, no thyromegaly Breast- normal symmetry, no nipple inversion,no nipple drainage, no nodules or lumps felt Nodes- no axillary nodes CVS- RRR, no murmur RESP-CTAB ABD-NABS,soft,NT,ND EXT- No edema Pulses- Radial, DP- 2+ GU- normal external genitalia, vaginal mucosa pink and moist, no cervix, uterus absent, no vaginal discharge  , no ovarian masses, urethra normal position  Rectaul-normal tone, FOBT neg, no external lesions       Assessment & Plan:    GYN exam- s/p hysterectomy, Mammogram to be done, Immunizations UTD

## 2012-02-02 NOTE — Assessment & Plan Note (Signed)
LFT being followed by GI, check FLP

## 2012-02-02 NOTE — Assessment & Plan Note (Signed)
D/c lisinopril. Change to HCTZ 25mg  Check FLP

## 2012-02-02 NOTE — Assessment & Plan Note (Signed)
Discussed weight, diet, she is to restart exercise program

## 2012-02-03 NOTE — Progress Notes (Signed)
Quick Note:  LMOM to call. ______ 

## 2012-02-03 NOTE — Progress Notes (Signed)
Quick Note:  Please let pt know her liver tests are all normal. Keep up the good work! ABD [place] May 2014 re: FU gallbladder polyps Thanks ZO:[email or web address], Kingsley Spittle, MD  ______

## 2012-02-04 ENCOUNTER — Other Ambulatory Visit: Payer: Self-pay | Admitting: Family Medicine

## 2012-02-04 MED ORDER — LANSOPRAZOLE 30 MG PO CPDR: 30.0000 mg | DELAYED_RELEASE_CAPSULE | Freq: Every day | ORAL | Status: DC

## 2012-02-04 MED ORDER — HYDROCHLOROTHIAZIDE 25 MG PO TABS: 25.0000 mg | ORAL_TABLET | Freq: Every day | ORAL | Status: DC

## 2012-02-04 NOTE — Telephone Encounter (Signed)
Pt to start mail order

## 2012-02-11 NOTE — Progress Notes (Signed)
Quick Note:  LMOM to call. Mailing a letter also with the info. ______

## 2012-02-24 ENCOUNTER — Telehealth: Payer: Self-pay | Admitting: Family Medicine

## 2012-02-24 MED ORDER — FLUCONAZOLE 150 MG PO TABS: 150.0000 mg | ORAL_TABLET | Freq: Once | ORAL | Status: DC

## 2012-02-24 NOTE — Telephone Encounter (Signed)
Patient called she was prescribed clindamycin by her dentist however they feel distended her Diflucan just that this medication could be sent Diflucan 150mg  x1 was sent to pharmacy

## 2012-03-02 ENCOUNTER — Other Ambulatory Visit: Payer: Self-pay

## 2012-03-02 MED ORDER — LANSOPRAZOLE 30 MG PO CPDR: 30.0000 mg | DELAYED_RELEASE_CAPSULE | Freq: Every day | ORAL | Status: DC

## 2012-03-05 NOTE — Progress Notes (Signed)
PTS NEEDS OPV TO REASSESS DYSPHAGIA

## 2012-03-06 ENCOUNTER — Other Ambulatory Visit: Payer: Self-pay | Admitting: Family Medicine

## 2012-03-06 ENCOUNTER — Other Ambulatory Visit: Payer: Self-pay

## 2012-03-06 MED ORDER — LANSOPRAZOLE 30 MG PO CPDR: 30.0000 mg | DELAYED_RELEASE_CAPSULE | Freq: Every day | ORAL | Status: DC

## 2012-05-01 ENCOUNTER — Ambulatory Visit (INDEPENDENT_AMBULATORY_CARE_PROVIDER_SITE_OTHER): Payer: Managed Care, Other (non HMO) | Admitting: Family Medicine

## 2012-05-01 ENCOUNTER — Encounter: Payer: Self-pay | Admitting: Family Medicine

## 2012-05-01 VITALS — BP 140/88 | HR 73 | Resp 16 | Wt 164.0 lb

## 2012-05-01 DIAGNOSIS — M25529 Pain in unspecified elbow: Secondary | ICD-10-CM

## 2012-05-01 DIAGNOSIS — R209 Unspecified disturbances of skin sensation: Secondary | ICD-10-CM

## 2012-05-01 DIAGNOSIS — J029 Acute pharyngitis, unspecified: Secondary | ICD-10-CM

## 2012-05-01 DIAGNOSIS — E669 Obesity, unspecified: Secondary | ICD-10-CM

## 2012-05-01 DIAGNOSIS — M25521 Pain in right elbow: Secondary | ICD-10-CM

## 2012-05-01 DIAGNOSIS — I10 Essential (primary) hypertension: Secondary | ICD-10-CM

## 2012-05-01 DIAGNOSIS — R202 Paresthesia of skin: Secondary | ICD-10-CM

## 2012-05-01 MED ORDER — FLUTICASONE PROPIONATE 50 MCG/ACT NA SUSP: 2.0000 | Freq: Every day | NASAL | Status: DC

## 2012-05-01 MED ORDER — FLUCONAZOLE 150 MG PO TABS: 150.0000 mg | ORAL_TABLET | Freq: Once | ORAL | Status: DC

## 2012-05-01 MED ORDER — MELOXICAM 7.5 MG PO TABS: 7.5000 mg | ORAL_TABLET | Freq: Every day | ORAL | Status: AC

## 2012-05-01 MED ORDER — HYDROCHLOROTHIAZIDE 25 MG PO TABS: 25.0000 mg | ORAL_TABLET | Freq: Every day | ORAL | Status: DC

## 2012-05-01 MED ORDER — AMOXICILLIN 500 MG PO TABS: 500.0000 mg | ORAL_TABLET | Freq: Three times a day (TID) | ORAL | Status: DC

## 2012-05-01 MED ORDER — PHENTERMINE HCL 37.5 MG PO TABS: 37.5000 mg | ORAL_TABLET | Freq: Every day | ORAL | Status: DC

## 2012-05-01 MED ORDER — CEPHALEXIN 500 MG PO CAPS: 500.0000 mg | ORAL_CAPSULE | Freq: Three times a day (TID) | ORAL | Status: AC

## 2012-05-01 NOTE — Progress Notes (Signed)
  Subjective:    Patient ID: Adriana Ramsey, female    DOB: [birth year], 47 y.o.   MRN: [id number]  HPI  Pt here with sore throat, hoarse voice after having URI for the past 2 weeks, has seen white stuff on back of throat, no cough, mild allergies. Has been singing a lot at church  Right arm pain and hand pain,with tingling, past 2 weeks, took aleve which helped, also wearing a compression sleeve at elbow level helps withwork Has been taking BP at work range 130's/80-90's Obesity- wants to lose weight, has been trying to walk more and change diet has not seen much change, asking about diet pill Review of Systems - per above  GEN- denies fatigue, fever, weight loss,weakness, +recent illness HEENT- denies eye drainage, change in vision,+ nasal discharge, CVS- denies chest pain, palpitations RESP- denies SOB, cough, wheeze ABD- denies N/V, change in stools, abd pain GU- denies dysuria, hematuria, dribbling, incontinence MSK- + joint pain, muscle aches, injury Neuro- denies headache, dizziness, syncope, seizure activity      Objective:   Physical Exam GEN- NAD, alert and oriented x3 HEENT- PERRL, EOMI, non injected sclera, pink conjunctiva, MMM, oropharynx+ injection,+exudates post oropharynx, no tonsils, TM clear bilat no effusion, no maxillary sinus tenderness, ,nares clear, hoarse voice Neck- Supple, shotty LAD, normal ROM CVS- RRR, no murmur RESP-CTAB MSK- Normal inspection shoulder and arms, rotator cuff intact, biceps in tact, bilat, no swelling noted, mild TTP 2cm above right anticubital fossa, no erythema, neg empty can, strength equal bilat upper ext, Neuro- sensation in tact, normal tone UE, +  phalen's sign Right side, neg Tinels EXT- No edema Pulses- Radial 2+         Assessment & Plan:

## 2012-05-01 NOTE — Patient Instructions (Addendum)
For pharyngitis and sinus drainage- take the antibiotics ( amoxicillin) Dilfucan also given For allergies - take the flonase and zyrtec For your blood pressure, continue HCTZ work on weight For your muscle strain and carpal tunnel- take he anti-inflammatory once a day with food Start phentermine for weight loss in 2 weeks - take 1/2 tablet daily for 2 weeks then 1 tablet daily 1500 calorie diet Exercise 30 minutes 5 days a week F/U 2 months for weight and blood pressure

## 2012-05-03 DIAGNOSIS — M25529 Pain in unspecified elbow: Secondary | ICD-10-CM | POA: Insufficient documentation

## 2012-05-03 DIAGNOSIS — J029 Acute pharyngitis, unspecified: Secondary | ICD-10-CM | POA: Insufficient documentation

## 2012-05-03 DIAGNOSIS — R202 Paresthesia of skin: Secondary | ICD-10-CM | POA: Insufficient documentation

## 2012-05-03 NOTE — Assessment & Plan Note (Signed)
Some signs consistent with development of carpal tunnel, wishes to hold on further work-up has tried her husbands carpal tunnel braces they have helped minimally

## 2012-05-03 NOTE — Assessment & Plan Note (Signed)
BP looks okay, discussed diet and weight loss effects on BP, pt committed to change, will have her continue HCTZ and recording at home, with some weight loss, no further medications will be needed

## 2012-05-03 NOTE — Assessment & Plan Note (Signed)
Exudates on exam, based on centor criteria will treat with antibiotics, salt water gargles, NSAIDS

## 2012-05-03 NOTE — Assessment & Plan Note (Signed)
?   Epicondylitis but not classic, no true signs of bursitis, will start on NSAIDS, ICE, she wears a compression wrap

## 2012-05-03 NOTE — Assessment & Plan Note (Signed)
Discussed weight loss, has tried some dieting on her own BMI > 30 with co-morbidities Start phentermine as directed

## 2012-05-20 ENCOUNTER — Telehealth: Payer: Self-pay | Admitting: Family Medicine

## 2012-05-20 MED ORDER — DIPHENHYD-HYDROCORT-NYSTATIN MT SUSP: OROMUCOSAL | Status: DC

## 2012-05-20 NOTE — Telephone Encounter (Signed)
She can use nasal saline over the county  I also sent Dukes Magic mouthwash for the throat She needs to rest her voice as well

## 2012-05-20 NOTE — Telephone Encounter (Signed)
Please advise 

## 2012-05-21 NOTE — Telephone Encounter (Signed)
Patient aware.

## 2012-06-02 ENCOUNTER — Encounter: Payer: Self-pay | Admitting: Gastroenterology

## 2012-06-02 ENCOUNTER — Telehealth: Payer: Self-pay | Admitting: Gastroenterology

## 2012-06-02 NOTE — Telephone Encounter (Signed)
abd ultrasound in 6 months is on the May recall list

## 2012-06-02 NOTE — Telephone Encounter (Signed)
Cc PCP 

## 2013-01-15 ENCOUNTER — Telehealth: Payer: Self-pay | Admitting: Family Medicine

## 2013-01-15 NOTE — Telephone Encounter (Signed)
PT is hasn't seen Dr Jeanice Limurham in a while since she has got a new insurance She does not have a fever nasal congestion,runny nose,sore throat,no vomiting no diarrhea,watery eyes Call back [phone] Pharmacy is [facility] [phone])

## 2013-02-19 ENCOUNTER — Other Ambulatory Visit: Payer: Self-pay | Admitting: Family Medicine

## 2013-02-19 ENCOUNTER — Encounter: Payer: Self-pay | Admitting: *Deleted

## 2013-02-19 NOTE — Telephone Encounter (Signed)
Medication filled x1 with no refills. Requires office visit before any further refills can be given. Letter sent to patient to schedule.

## 2013-04-27 ENCOUNTER — Other Ambulatory Visit: Payer: Self-pay | Admitting: Family Medicine

## 2013-04-27 NOTE — Telephone Encounter (Signed)
Refill appropriate and filled per protocol. 

## 2013-04-28 ENCOUNTER — Other Ambulatory Visit: Payer: Self-pay | Admitting: *Deleted

## 2013-04-28 MED ORDER — LANSOPRAZOLE 30 MG PO CPDR: DELAYED_RELEASE_CAPSULE | ORAL | Status: DC

## 2013-04-28 NOTE — Telephone Encounter (Signed)
Refill appropriate and filled per protocol. 

## 2013-05-07 ENCOUNTER — Ambulatory Visit: Payer: Self-pay | Admitting: Family Medicine

## 2013-09-20 ENCOUNTER — Other Ambulatory Visit: Payer: Managed Care, Other (non HMO)

## 2013-09-20 DIAGNOSIS — E669 Obesity, unspecified: Secondary | ICD-10-CM

## 2013-09-20 DIAGNOSIS — R7989 Other specified abnormal findings of blood chemistry: Secondary | ICD-10-CM

## 2013-09-20 DIAGNOSIS — R945 Abnormal results of liver function studies: Secondary | ICD-10-CM

## 2013-09-20 DIAGNOSIS — I10 Essential (primary) hypertension: Secondary | ICD-10-CM

## 2013-09-20 DIAGNOSIS — K760 Fatty (change of) liver, not elsewhere classified: Secondary | ICD-10-CM

## 2013-09-20 LAB — LIPID PANEL
CHOL/HDL RATIO: 3.8 ratio
Cholesterol: 156 mg/dL (ref 0–200)
HDL: 41 mg/dL (ref >39)
LDL CALC: 93 mg/dL (ref 0–99)
TRIGLYCERIDES: 112 mg/dL (ref <150)
VLDL: 22 mg/dL (ref 0–40)

## 2013-09-20 LAB — CBC WITH DIFFERENTIAL/PLATELET
Basophils Absolute: 0 K/uL (ref 0.0–0.1)
Basophils Relative: 1 % (ref 0–1)
Eosinophils Absolute: 0.2 K/uL (ref 0.0–0.7)
Eosinophils Relative: 4 % (ref 0–5)
HCT: 37.1 % (ref 36.0–46.0)
Hemoglobin: 12.6 g/dL (ref 12.0–15.0)
Lymphocytes Relative: 46 % (ref 12–46)
Lymphs Abs: 2 K/uL (ref 0.7–4.0)
MCH: 27.3 pg (ref 26.0–34.0)
MCHC: 34 g/dL (ref 30.0–36.0)
MCV: 80.3 fL (ref 78.0–100.0)
Monocytes Absolute: 0.4 K/uL (ref 0.1–1.0)
Monocytes Relative: 9 % (ref 3–12)
Neutro Abs: 1.8 K/uL (ref 1.7–7.7)
Neutrophils Relative %: 40 % — ABNORMAL LOW (ref 43–77)
Platelets: 269 K/uL (ref 150–400)
RBC: 4.62 MIL/uL (ref 3.87–5.11)
RDW: 13.3 % (ref 11.5–15.5)
WBC: 4.4 K/uL (ref 4.0–10.5)

## 2013-09-20 LAB — COMPLETE METABOLIC PANEL WITH GFR
ALBUMIN: 4.2 g/dL (ref 3.5–5.2)
ALT: 41 U/L — AB (ref 0–35)
AST: 32 U/L (ref 0–37)
Alkaline Phosphatase: 56 U/L (ref 39–117)
BUN: 16 mg/dL (ref 6–23)
CO2: 27 meq/L (ref 19–32)
Calcium: 9.3 mg/dL (ref 8.4–10.5)
Chloride: 105 mEq/L (ref 96–112)
Creat: 0.7 mg/dL (ref 0.50–1.10)
GFR, Est African American: >89 mL/min
GLUCOSE: 98 mg/dL (ref 70–99)
POTASSIUM: 4 meq/L (ref 3.5–5.3)
SODIUM: 140 meq/L (ref 135–145)
Total Bilirubin: 0.5 mg/dL (ref 0.2–1.2)
Total Protein: 6.8 g/dL (ref 6.0–8.3)

## 2013-09-22 ENCOUNTER — Encounter: Payer: Self-pay | Admitting: Family Medicine

## 2013-09-22 ENCOUNTER — Ambulatory Visit (INDEPENDENT_AMBULATORY_CARE_PROVIDER_SITE_OTHER): Payer: Managed Care, Other (non HMO) | Admitting: Family Medicine

## 2013-09-22 VITALS — BP 132/76 | HR 68 | Temp 98.8°F | Resp 14 | Ht 60.0 in | Wt 170.0 lb

## 2013-09-22 DIAGNOSIS — K824 Cholesterolosis of gallbladder: Secondary | ICD-10-CM

## 2013-09-22 DIAGNOSIS — M654 Radial styloid tenosynovitis [de Quervain]: Secondary | ICD-10-CM

## 2013-09-22 DIAGNOSIS — K7689 Other specified diseases of liver: Secondary | ICD-10-CM

## 2013-09-22 DIAGNOSIS — I10 Essential (primary) hypertension: Secondary | ICD-10-CM

## 2013-09-22 DIAGNOSIS — Z0000 Encounter for general adult medical examination without abnormal findings: Secondary | ICD-10-CM

## 2013-09-22 DIAGNOSIS — K760 Fatty (change of) liver, not elsewhere classified: Secondary | ICD-10-CM

## 2013-09-22 DIAGNOSIS — R202 Paresthesia of skin: Secondary | ICD-10-CM

## 2013-09-22 DIAGNOSIS — Z1231 Encounter for screening mammogram for malignant neoplasm of breast: Secondary | ICD-10-CM

## 2013-09-22 DIAGNOSIS — R209 Unspecified disturbances of skin sensation: Secondary | ICD-10-CM

## 2013-09-22 DIAGNOSIS — R350 Frequency of micturition: Secondary | ICD-10-CM

## 2013-09-22 DIAGNOSIS — E669 Obesity, unspecified: Secondary | ICD-10-CM

## 2013-09-22 LAB — URINALYSIS, ROUTINE W REFLEX MICROSCOPIC
BILIRUBIN URINE: NEGATIVE
Glucose, UA: NEGATIVE mg/dL
Ketones, ur: NEGATIVE mg/dL
Leukocytes, UA: NEGATIVE
NITRITE: NEGATIVE
Protein, ur: NEGATIVE mg/dL
Specific Gravity, Urine: 1.01 (ref 1.005–1.030)
Urobilinogen, UA: 0.2 mg/dL (ref 0.0–1.0)
pH: 5.5 (ref 5.0–8.0)

## 2013-09-22 LAB — URINALYSIS, MICROSCOPIC ONLY
Casts: NONE SEEN
Crystals: NONE SEEN

## 2013-09-22 MED ORDER — GABAPENTIN 300 MG PO CAPS: 300.0000 mg | ORAL_CAPSULE | Freq: Every day | ORAL | Status: DC

## 2013-09-22 MED ORDER — LANSOPRAZOLE 30 MG PO CPDR: DELAYED_RELEASE_CAPSULE | ORAL | Status: DC

## 2013-09-22 MED ORDER — HYDROCHLOROTHIAZIDE 25 MG PO TABS: ORAL_TABLET | ORAL | Status: DC

## 2013-09-22 MED ORDER — METHYLPREDNISOLONE (PAK) 4 MG PO TABS: ORAL_TABLET | ORAL | Status: DC

## 2013-09-22 NOTE — Assessment & Plan Note (Signed)
Unable tolerate most NSAIDs. I will put her on Medrol Dosepak. I've also given her a thumb/ wrist brace to use for the next 2 weeks

## 2013-09-22 NOTE — Progress Notes (Signed)
Patient ID: Adriana Ramsey, female   DOB: 09/10/1965, 48 y.o.   MRN: [id number]   Subjective:    Patient ID: Adriana Ramsey, female    DOB: [birth year], 48 y.o.   MRN: [id number]  Patient presents for CPE with PAP, Possible UTI and L wrist pain  patient here for physical exam. She's not been seen about a year and a half. She status post hysterectomy for fibroids. She is overdue for mammogram. I reviewed her fasting labs with her in the office.  His history of hypertension she did have refills on her previous prescription therefore is only been out of her blood pressure medicine for the past week.  She continues to complain of tingling and numbness in her hands occasionally gets in her feet when she is sitting long periods of time. About a year ago we discussed workup for carpal Elveria Rising she still does not want to proceed with nerve conduction study would like to try medication.  Left wrist pain worsened over the past couple weeks. She's been lifting her grandson a lot and doing a lot of repetitive motions at work with her wrist. 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 She did try taking some Aleve but has been very cautious because she's had allergic reactions to ibuprofen and other NSAIDs. She also has been trying to avoid Tylenol because of her history of elevated liver function tests  Mild dsyruria x 1 day, no blood in urine   Review Of Systems:  GEN- denies fatigue, fever, weight loss,weakness, recent illness HEENT- denies eye drainage, change in vision, nasal discharge, CVS- denies chest pain, palpitations RESP- denies SOB, cough, wheeze ABD- denies N/V, change in stools, abd pain GU- +dysuria, denies hematuria, dribbling, incontinence MSK- +joint pain, muscle aches, injury Neuro- denies headache, dizziness, syncope, seizure activity       Objective:    BP 132/76  Pulse 68  Temp(Src) 98.8 F (37.1 C) (Oral)  Resp 14  Ht 5' (1.524 m)  Wt 170 lb (77.111 kg)  BMI 33.20  kg/m2 GEN- NAD, alert and oriented x3 HEENT- PERRL, EOMI, non injected sclera, pink conjunctiva, MMM, oropharynx clear Neck- Supple, no thyromegaly Breast- normal symmetry, no nipple inversion,no nipple drainage, no nodules or lumps felt Nodes- no axillary nodes CVS- RRR, no murmur RESP-CTAB ABD-NABS,soft,NT,ND GU- normal external genitalia, vaginal mucosa pink and moist, no cervix noted, no uterus, ovaries not palpated, Rectum- normal appearance MSK- TTp along left thumb base, no swelling , no erythema, +finkelsteins, good ROm wrist, grasp normal bilat Neuro- Normal monofilament bilat feet and hands EXT- No edema Pulses- Radial, DP- 2+  UA-NEGATIVE      Assessment & Plan:      Problem List Items Addressed This Visit   Routine general medical examination at a health care facility     CPE done, No PAP S/p benign hysterectomy Mammogram to be scheduled Colonoscopy age 38 Flu shot given on her job    Paresthesia of hand     Is still concerned for carpal Elveria Rising like a was a year ago. She does not want have nerve conduction study done. (Gabapentin 300 mg at bedtime    Obesity (BMI 30-39.9)     Discussed importance of dietary changes and weight loss she wants to work on weight use a low calorie low carb which I recommend    Gallbladder polyp   Relevant Orders      Ambulatory referral to Gastroenterology   Fatty liver  Recent lipid panel was normal. Her LFTs were minimally elevated she is due for repeat followup secondary to her gallbladder polyps therefore will arrange this as well with gastroenterology    Relevant Orders      Ambulatory referral to Gastroenterology   Essential hypertension, benign     Well controlled, continue HCTZ    Relevant Medications      hydrochlorothiazide tablet   De Quervain's tenosynovitis, left     Unable tolerate most NSAIDs. I will put her on Medrol Dosepak. I've also given her a thumb/ wrist brace to use for the next 2 weeks     Other  Visit Diagnoses   Urinary frequency    -  Primary    Relevant Orders       Urinalysis, Routine w reflex microscopic (Completed)       Note: This dictation was prepared with Dragon dictation along with smaller phrase technology. Any transcriptional errors that result from this process are unintentional.

## 2013-09-22 NOTE — Assessment & Plan Note (Signed)
Recent lipid panel was normal. Her LFTs were minimally elevated she is due for repeat followup secondary to her gallbladder polyps therefore will arrange this as well with gastroenterology

## 2013-09-22 NOTE — Assessment & Plan Note (Signed)
CPE done, No PAP S/p benign hysterectomy Mammogram to be scheduled Colonoscopy age 48 Flu shot given on her job

## 2013-09-22 NOTE — Assessment & Plan Note (Signed)
Well controlled, continue HCTZ 

## 2013-09-22 NOTE — Assessment & Plan Note (Signed)
Discussed importance of dietary changes and weight loss she wants to work on weight use a low calorie low carb which I recommend

## 2013-09-22 NOTE — Assessment & Plan Note (Signed)
Is still concerned for carpal Adriana Ramsey like a was a year ago. She does not want have nerve conduction study done. (Gabapentin 300 mg at bedtime

## 2013-09-22 NOTE — Patient Instructions (Addendum)
Mammogram to be scheduled Restart blood pressure medication Try the gabapentin at bedtime Steroid dosepak for your wrist F/U 6 months Schedule with GI

## 2013-09-23 ENCOUNTER — Encounter: Payer: Self-pay | Admitting: Gastroenterology

## 2013-09-27 ENCOUNTER — Encounter: Payer: Self-pay | Admitting: *Deleted

## 2013-10-13 ENCOUNTER — Telehealth: Payer: Self-pay | Admitting: Family Medicine

## 2013-10-13 DIAGNOSIS — G5600 Carpal tunnel syndrome, unspecified upper limb: Secondary | ICD-10-CM

## 2013-10-13 NOTE — Telephone Encounter (Signed)
Patient is calling to let you know that her wrist is still hurting her pretty bad just would like to know the next step  [phone]

## 2013-10-13 NOTE — Telephone Encounter (Signed)
MD please advise

## 2013-10-13 NOTE — Telephone Encounter (Signed)
Send referral to orthopedics in [place]- DeQuervains tenosynovitis, possible Carpal Tunnel

## 2013-10-14 NOTE — Telephone Encounter (Signed)
Call placed to patient and patient made aware.   Referral placed.   

## 2013-10-26 ENCOUNTER — Encounter: Payer: Self-pay | Admitting: Gastroenterology

## 2013-10-26 ENCOUNTER — Ambulatory Visit (INDEPENDENT_AMBULATORY_CARE_PROVIDER_SITE_OTHER): Payer: Managed Care, Other (non HMO) | Admitting: Gastroenterology

## 2013-10-26 VITALS — BP 121/83 | HR 83 | Temp 97.2°F | Resp 18 | Ht 60.0 in | Wt 170.0 lb

## 2013-10-26 DIAGNOSIS — R7989 Other specified abnormal findings of blood chemistry: Secondary | ICD-10-CM

## 2013-10-26 DIAGNOSIS — K760 Fatty (change of) liver, not elsewhere classified: Secondary | ICD-10-CM

## 2013-10-26 DIAGNOSIS — R945 Abnormal results of liver function studies: Secondary | ICD-10-CM

## 2013-10-26 DIAGNOSIS — R194 Change in bowel habit: Secondary | ICD-10-CM | POA: Insufficient documentation

## 2013-10-26 DIAGNOSIS — K824 Cholesterolosis of gallbladder: Secondary | ICD-10-CM

## 2013-10-26 NOTE — Progress Notes (Signed)
Primary Care Physician:  Milinda AntisURHAM, KAWANTA, MD  Primary Gastroenterologist:  Jonette EvaSandi Fields, MD   Chief Complaint  Patient presents with  . fatty liver, gallbladder polyps    HPI:  Adriana Ramsey is a 48 y.o. female here for follow up of fatty liver, elevated LFTs, and gallbladder polyp.   Last seen in the office back in November 2013. Previously negative for hepatitis B and C. Last abdominal ultrasound October 2013, probable 3 mm gallbladder polyp seen as well as probable mild fatty liver. She failed to followup on surveillance ultrasound as recommended last year. LFTs in September were much improved from the past. She still had mildly elevated ALT of 41 but otherwise unremarkable.  Overall she is doing fairly well. Rare nausea with meals. Bristol stool 3-5. No blood in stool. BM every 2-3 days to QOD. Rare abdominal cramps. Heartburn well controlled. No melena. No dysphagia. Bowel issues post-hysterectomy, Dr. Katrinka BlazingSmith. No prior colonoscopy.   Current Outpatient Prescriptions  Medication Sig Dispense Refill  . EPINEPHrine (EPI-PEN) 0.3 mg/0.3 mL DEVI Inject 0.3 mLs (0.3 mg total) into the muscle once.  2 Device  1  . gabapentin (NEURONTIN) 300 MG capsule Take 1 capsule (300 mg total) by mouth at bedtime.  90 capsule  1  . hydrochlorothiazide (HYDRODIURIL) 25 MG tablet TAKE 1 TABLET DAILY  90 tablet  1  . lansoprazole (PREVACID) 30 MG capsule TAKE 1 CAPSULE DAILY  30 capsule  0   No current facility-administered medications for this visit.    Allergies as of 10/26/2013 - Review Complete 10/26/2013  Allergen Reaction Noted  . Sulfur Swelling 07/17/2011  . Ibuprofen  09/22/2013  . Penicillins  05/01/2012    Past Medical History  Diagnosis Date  . Hypertension   . PUD (peptic ulcer disease) 2011  . Allergy   . GERD (gastroesophageal reflux disease)   . Gallbladder polyp 2012    Past Surgical History  Procedure Laterality Date  . Breast reduction surgery  2007  . Abdominal  hysterectomy      partial   . Cesarean section      2  . Esophagogastroduodenoscopy  11/23/2009    NUR: hypopharyngeal erosions of unclear sidnificance/mild changes of reflux /small sliding hiatal hernia and non Schatzki ring/3-mm gastric ulcer along the antrum with 2 scars and few erosions. h.pyori serologies negative    Family History  Problem Relation Age of Onset  . Hyperlipidemia Mother   . Seizures Mother     History   Social History  . Marital Status: Married    Spouse Name: N/A    Number of Children: 2  . Years of Education: N/A   Occupational History  . billing specialist [employer]    Social History Main Topics  . Smoking status: Never Smoker   . Smokeless tobacco: Not on file  . Alcohol Use: Yes     Comment: wine 1 glass per week  . Drug Use: No  . Sexual Activity: Yes    Partners: Male   Other Topics Concern  . Not on file   Social History Narrative   Lives w/ husband & 1 son, 1 grown daughter      ROS:  General: Negative for anorexia, weight loss, fever, chills, fatigue, weakness. Eyes: Negative for vision changes.  ENT: Negative for hoarseness, difficulty swallowing , nasal congestion. CV: Negative for chest pain, angina, palpitations, dyspnea on exertion, peripheral edema.  Respiratory: Negative for dyspnea at rest, dyspnea on exertion, cough, sputum, wheezing.  GI:  See history of present illness. GU:  Negative for dysuria, hematuria, urinary incontinence, urinary frequency, nocturnal urination.  MS: Negative for joint pain, low back pain.  Derm: Negative for rash or itching.  Neuro: Negative for weakness, abnormal sensation, seizure, frequent headaches, memory loss, confusion.  Psych: Negative for anxiety, depression, suicidal ideation, hallucinations.  Endo: Negative for unusual weight change.  Heme: Negative for bruising or bleeding. Allergy: Negative for rash or hives.    Physical Examination:  BP 121/83  Pulse 83  Temp(Src) 97.2 F (36.2  C) (Oral)  Resp 18  Ht 5' (1.524 m)  Wt 170 lb (77.111 kg)  BMI 33.20 kg/m2   General: Well-nourished, well-developed in no acute distress.  Head: Normocephalic, atraumatic.   Eyes: Conjunctiva pink, no icterus. Mouth: Oropharyngeal mucosa moist and pink , no lesions erythema or exudate. Neck: Supple without thyromegaly, masses, or lymphadenopathy.  Lungs: Clear to auscultation bilaterally.  Heart: Regular rate and rhythm, no murmurs rubs or gallops.  Abdomen: Bowel sounds are normal, nontender, nondistended, no hepatosplenomegaly or masses, no abdominal bruits or    hernia , no rebound or guarding.   Rectal: Not performed Extremities: No lower extremity edema. No clubbing or deformities.  Neuro: Alert and oriented x 4 , grossly normal neurologically.  Skin: Warm and dry, no rash or jaundice.   Psych: Alert and cooperative, normal mood and affect.  Labs: Lab Results  Component Value Date   WBC 4.4 09/20/2013   HGB 12.6 09/20/2013   HCT 37.1 09/20/2013   MCV 80.3 09/20/2013   PLT 269 09/20/2013   Lab Results  Component Value Date   CREATININE 0.70 09/20/2013   BUN 16 09/20/2013   NA 140 09/20/2013   K 4.0 09/20/2013   CL 105 09/20/2013   CO2 27 09/20/2013   Lab Results  Component Value Date   ALT 41* 09/20/2013   AST 32 09/20/2013   ALKPHOS 56 09/20/2013   BILITOT 0.5 09/20/2013      Imaging Studies: No results found.

## 2013-10-26 NOTE — Patient Instructions (Signed)
1. Trial of Restora one daily for 3 weeks. Samples provided. This may help stabilize your bowel function.  2. Abdominal u/s to follow up on gallbladder polyp and liver. 3. Please have your labs done in 4 months. We will send a reminder letter.  4. You can have a colonoscopy any time you are ready. Please let me know when you are ready and we will schedule it.

## 2013-10-27 NOTE — Assessment & Plan Note (Signed)
Due for surveillance ultrasound followup gallbladder polyp.

## 2013-10-27 NOTE — Assessment & Plan Note (Signed)
Chronic intermittent abdominal cramps associated with somewhat looser stools, without change in frequency. Noted since hysterectomy years ago. Possible IBS. Offered colonoscopy predominantly for screening purposes given age. Trial of Restora one daily for 3 weeks. Patient not interested in colonoscopy at this time but will call when ready. Ok for triage when she does.

## 2013-10-27 NOTE — Assessment & Plan Note (Addendum)
Fatty liver with minimally elevated ALT at this time. Reevaluate at time of ultrasound. Repeat LFTs in 4 months.   Instructions for fatty liver: Recommend 1-2# weight loss per week until ideal body weight through exercise & diet. Low fat/cholesterol diet.   Avoid sweets, sodas, fruit juices, sweetened beverages like tea, etc. Gradually increase exercise from 15 min daily up to 1 hr per day 5 days/week. Limit alcohol use.

## 2013-10-29 ENCOUNTER — Ambulatory Visit (HOSPITAL_COMMUNITY): Admission: RE | Admit: 2013-10-29 | Payer: Managed Care, Other (non HMO) | Source: Ambulatory Visit

## 2013-10-29 NOTE — Progress Notes (Signed)
cc'ed to pcp °

## 2013-11-02 ENCOUNTER — Ambulatory Visit (INDEPENDENT_AMBULATORY_CARE_PROVIDER_SITE_OTHER): Payer: Managed Care, Other (non HMO) | Admitting: Orthopedic Surgery

## 2013-11-02 ENCOUNTER — Ambulatory Visit (INDEPENDENT_AMBULATORY_CARE_PROVIDER_SITE_OTHER): Payer: Managed Care, Other (non HMO)

## 2013-11-02 ENCOUNTER — Encounter: Payer: Self-pay | Admitting: Orthopedic Surgery

## 2013-11-02 VITALS — BP 119/71 | Ht 60.0 in | Wt 170.0 lb

## 2013-11-02 DIAGNOSIS — M25532 Pain in left wrist: Secondary | ICD-10-CM

## 2013-11-02 DIAGNOSIS — M654 Radial styloid tenosynovitis [de Quervain]: Secondary | ICD-10-CM

## 2013-11-02 NOTE — Progress Notes (Signed)
Patient ID: Adriana Ramsey, female   DOB: 01/13/1965, 48 y.o.   MRN: [id number]  Chief Complaint  Patient presents with  . Wrist Pain    left wrist pain, no known injury   Patient ID: Adriana Ramsey, female   DOB: 07/10/1965, 48 y.o.   MRN: [id number]  Chief Complaint  Patient presents with  . Wrist Pain    left wrist pain, no known injury    HPI [employer] ExpressEllajaretta W Ramsey is a 48 y.o. female.  HPI  48 year old female presents with pain over the left radial side of the risks associated with motion and movement of her thumb, previous treatment with Aleve, Tylenol but there are concerns related to GI tract and LFTs elevation in the past. She was also treated with a brace she wore for 2 weeks. There was concern over carpal tunnel symptoms but her hand and fingers are normal.  We do note that she is a new grandmother and has a large new grandchild which she lifts frequently.   Past Medical History  Diagnosis Date  . Hypertension   . PUD (peptic ulcer disease) 2011  . Allergy   . GERD (gastroesophageal reflux disease)   . Gallbladder polyp 2012    Past Surgical History  Procedure Laterality Date  . Breast reduction surgery  2007  . Abdominal hysterectomy      partial   . Cesarean section      2  . Esophagogastroduodenoscopy  11/23/2009    NUR: hypopharyngeal erosions of unclear sidnificance/mild changes of reflux /small sliding hiatal hernia and non Schatzki ring/3-mm gastric ulcer along the antrum with 2 scars and few erosions. h.pyori serologies negative    Family History  Problem Relation Age of Onset  . Hyperlipidemia Mother   . Seizures Mother     Social History History  Substance Use Topics  . Smoking status: Never Smoker   . Smokeless tobacco: Not on file  . Alcohol Use: Yes     Comment: wine 1 glass per week    Allergies  Allergen Reactions  . Sulfur Swelling  . Ibuprofen   . Penicillins     Current Outpatient Prescriptions  Medication Sig Dispense  Refill  . gabapentin (NEURONTIN) 300 MG capsule Take 1 capsule (300 mg total) by mouth at bedtime.  90 capsule  1  . hydrochlorothiazide (HYDRODIURIL) 25 MG tablet TAKE 1 TABLET DAILY  90 tablet  1  . lansoprazole (PREVACID) 30 MG capsule TAKE 1 CAPSULE DAILY  30 capsule  0  . EPINEPHrine (EPI-PEN) 0.3 mg/0.3 mL DEVI Inject 0.3 mLs (0.3 mg total) into the muscle once.  2 Device  1   No current facility-administered medications for this visit.    Review of Systems Review of Systems  Gastrointestinal: Positive for abdominal pain and constipation.  Allergic/Immunologic: Positive for environmental allergies.  Neurological: Positive for numbness.  All other systems reviewed and are negative.   Blood pressure 119/71, height 5' (1.524 m), weight 170 lb (77.111 kg).  Physical Exam Physical Exam BP 119/71  Ht 5' (1.524 m)  Wt 170 lb (77.111 kg)  BMI 33.20 kg/m2  Grooming and hygiene are noted. The patient is awake alert and oriented 3 mood and affect are normal. She has normal perfusion and radial and ulnar pulse with normal sensation in the right upper extremity. There is tenderness and swelling over the first extensor compartment and painful ulnar deviation of the wrist without limitation of motion. Wrist joint itself is stable. Pinch strength  is normal. Skin is normal. Capillary refill normal. Epitrochlear lymph nodes normal.` Data Reviewed X-ray was ordered and obtained my interpretation of the x-ray is that she has a normal wrist forearm x-ray Assessment    Encounter Diagnoses  Name Primary?  . Left wrist pain   . De Quervain's syndrome (tenosynovitis) Yes        Plan    We recommend a wrist splint to include the thumb with a Rhino type splint. Recommend Aspercreme topical anti-inflammatory. We discussed injection but the patient declined and will get one if this does not work. We also discussed surgical options if the injection doesn't work as well.        Fuller CanadaStanley  Lori Liew 11/02/2013, 2:36 PM

## 2013-11-04 ENCOUNTER — Ambulatory Visit (HOSPITAL_COMMUNITY)
Admission: RE | Admit: 2013-11-04 | Discharge: 2013-11-04 | Disposition: A | Discharge status: Home / Self Care | Payer: Managed Care, Other (non HMO) | Source: Ambulatory Visit | Attending: Gastroenterology | Admitting: Gastroenterology

## 2013-11-04 DIAGNOSIS — R945 Abnormal results of liver function studies: Secondary | ICD-10-CM

## 2013-11-04 DIAGNOSIS — K760 Fatty (change of) liver, not elsewhere classified: Secondary | ICD-10-CM | POA: Diagnosis present

## 2013-11-04 DIAGNOSIS — R7989 Other specified abnormal findings of blood chemistry: Secondary | ICD-10-CM | POA: Insufficient documentation

## 2013-11-04 DIAGNOSIS — K824 Cholesterolosis of gallbladder: Secondary | ICD-10-CM | POA: Insufficient documentation

## 2013-11-04 DIAGNOSIS — R194 Change in bowel habit: Secondary | ICD-10-CM

## 2013-11-05 ENCOUNTER — Other Ambulatory Visit: Payer: Self-pay

## 2013-11-05 DIAGNOSIS — R7989 Other specified abnormal findings of blood chemistry: Secondary | ICD-10-CM

## 2013-11-05 DIAGNOSIS — R945 Abnormal results of liver function studies: Principal | ICD-10-CM

## 2013-11-05 NOTE — Progress Notes (Signed)
Quick Note:  Please let patient know her u/s showed fatty liver. Gallbladder actually looked good, no polyp noted. No further surveillance u/s needed.  LFTs in 4 months as planned. Colonoscopy whenever she decides she is ready. ______

## 2013-11-05 NOTE — Progress Notes (Signed)
Quick Note:  Pt aware of results and will call when ready to schedule colonoscopy. Lab order on file for 02/26/2014. ______

## 2013-11-25 ENCOUNTER — Telehealth: Payer: Self-pay | Admitting: *Deleted

## 2013-11-25 NOTE — Telephone Encounter (Signed)
Contacted pt left message on vm pt has appointment on Nov 30 at 10:30 at [facility], pt is to not wear any powders, perfumes or lotions.

## 2013-11-30 ENCOUNTER — Encounter: Payer: Self-pay | Admitting: *Deleted

## 2013-11-30 ENCOUNTER — Telehealth: Payer: Self-pay | Admitting: Gastroenterology

## 2013-11-30 NOTE — Telephone Encounter (Signed)
Left message on spouse vm to return my call.

## 2013-11-30 NOTE — Telephone Encounter (Signed)
Pt called to see if she could get some samples of Restora if we have any or either call a prescription [facility].

## 2013-11-30 NOTE — Telephone Encounter (Signed)
Sent letter with appiontment date,time and location to pt

## 2013-12-01 NOTE — Telephone Encounter (Signed)
Patient is aware that she can get restora OTC

## 2013-12-01 NOTE — Telephone Encounter (Signed)
No phone number listed for pt. I called her husband's number listed as emergency contact and asked for a return call.  ( We do not have samples of Restora and it is an OTC med, not a prescription med. She can ask pharmacist to help her find it.

## 2013-12-06 ENCOUNTER — Ambulatory Visit (HOSPITAL_COMMUNITY)
Admission: RE | Admit: 2013-12-06 | Discharge: 2013-12-06 | Disposition: A | Discharge status: Home / Self Care | Payer: Managed Care, Other (non HMO) | Source: Ambulatory Visit | Attending: Family Medicine | Admitting: Family Medicine

## 2013-12-06 DIAGNOSIS — Z1231 Encounter for screening mammogram for malignant neoplasm of breast: Secondary | ICD-10-CM

## 2013-12-07 ENCOUNTER — Other Ambulatory Visit: Payer: Self-pay | Admitting: Family Medicine

## 2013-12-07 ENCOUNTER — Ambulatory Visit (INDEPENDENT_AMBULATORY_CARE_PROVIDER_SITE_OTHER): Payer: Managed Care, Other (non HMO) | Admitting: Orthopedic Surgery

## 2013-12-07 ENCOUNTER — Encounter: Payer: Self-pay | Admitting: Orthopedic Surgery

## 2013-12-07 VITALS — BP 143/94 | Ht 60.0 in | Wt 170.0 lb

## 2013-12-07 DIAGNOSIS — M654 Radial styloid tenosynovitis [de Quervain]: Secondary | ICD-10-CM

## 2013-12-07 DIAGNOSIS — R928 Other abnormal and inconclusive findings on diagnostic imaging of breast: Secondary | ICD-10-CM

## 2013-12-07 NOTE — Progress Notes (Signed)
Patient ID: Adriana Ramsey, female   DOB: 02/03/1965, 48 y.o.   MRN: [id number] Chief Complaint  Patient presents with  . Follow-up    recheck left wrist s/p brace    48 years old presented with de Quervain's syndrome treated with bracing ice and anti-inflammatory she reports that her symptoms have all but resolved  She denies any pain swelling or numbness or tingling. Review of systems negative.  Exam shows no tenderness no swelling full range of motion negative Finkelstein stable wrist normal wrist extension grade 5 extensor pollicis longus and brevis skin intact pulses good  Resolved follow-up as needed

## 2013-12-21 ENCOUNTER — Ambulatory Visit (HOSPITAL_COMMUNITY)
Admission: RE | Admit: 2013-12-21 | Discharge: 2013-12-21 | Disposition: A | Discharge status: Home / Self Care | Payer: Managed Care, Other (non HMO) | Source: Ambulatory Visit | Attending: Family Medicine | Admitting: Family Medicine

## 2013-12-21 ENCOUNTER — Other Ambulatory Visit: Payer: Self-pay | Admitting: Family Medicine

## 2013-12-21 DIAGNOSIS — Z1231 Encounter for screening mammogram for malignant neoplasm of breast: Secondary | ICD-10-CM | POA: Diagnosis present

## 2013-12-21 DIAGNOSIS — R928 Other abnormal and inconclusive findings on diagnostic imaging of breast: Secondary | ICD-10-CM

## 2013-12-21 DIAGNOSIS — R922 Inconclusive mammogram: Secondary | ICD-10-CM | POA: Diagnosis not present

## 2014-02-02 ENCOUNTER — Other Ambulatory Visit: Payer: Self-pay

## 2014-02-02 DIAGNOSIS — R7989 Other specified abnormal findings of blood chemistry: Secondary | ICD-10-CM

## 2014-02-02 DIAGNOSIS — R945 Abnormal results of liver function studies: Secondary | ICD-10-CM

## 2014-02-02 DIAGNOSIS — K760 Fatty (change of) liver, not elsewhere classified: Secondary | ICD-10-CM

## 2014-02-03 ENCOUNTER — Encounter: Payer: Self-pay | Admitting: Physician Assistant

## 2014-02-03 ENCOUNTER — Ambulatory Visit (INDEPENDENT_AMBULATORY_CARE_PROVIDER_SITE_OTHER): Payer: Managed Care, Other (non HMO) | Admitting: Physician Assistant

## 2014-02-03 VITALS — BP 124/84 | HR 68 | Temp 98.1°F | Resp 20 | Wt 173.0 lb

## 2014-02-03 DIAGNOSIS — J988 Other specified respiratory disorders: Secondary | ICD-10-CM

## 2014-02-03 DIAGNOSIS — B9689 Other specified bacterial agents as the cause of diseases classified elsewhere: Principal | ICD-10-CM

## 2014-02-03 MED ORDER — HYDROCOD POLST-CHLORPHEN POLST 10-8 MG/5ML PO LQCR: 5.0000 mL | Freq: Two times a day (BID) | ORAL | Status: DC | PRN

## 2014-02-03 MED ORDER — FLUCONAZOLE 150 MG PO TABS: 150.0000 mg | ORAL_TABLET | Freq: Once | ORAL | Status: DC

## 2014-02-03 MED ORDER — LEVOFLOXACIN 750 MG PO TABS: 750.0000 mg | ORAL_TABLET | Freq: Every day | ORAL | Status: DC

## 2014-02-03 NOTE — Progress Notes (Signed)
Patient ID: Adriana Ramsey MRN: [id number], DOB: 02/25/1965, 49 y.o. Date of Encounter: 02/03/2014, 5:10 PM    Chief Complaint:  Chief Complaint  Patient presents with  . sick x 1 week    seen ED([facility]) bronchitis,  not getting better, was given Z-pak and prednisone dose pack, albuterol MDI     HPI: 49 y.o. year old AA female presents with above symptoms.  Says that the ER did a chest x-ray that showed no pneumonia.  Says that she has never smoked. Says that she has never been tightness with asthma. Says that most of her year she has an episode of symptoms like this.  Says that she took the Z-Pak and prednisone prescribed by the ER. However says that she still has a lot of chest congestion and coughing. Says now her right ear feels like it's even congested. Says that she is blowing thick yellow mucus from her nose still.     Home Meds:   Outpatient Prescriptions Prior to Visit  Medication Sig Dispense Refill  . EPINEPHrine (EPI-PEN) 0.3 mg/0.3 mL DEVI Inject 0.3 mLs (0.3 mg total) into the muscle once. 2 Device 1  . gabapentin (NEURONTIN) 300 MG capsule Take 1 capsule (300 mg total) by mouth at bedtime. 90 capsule 1  . hydrochlorothiazide (HYDRODIURIL) 25 MG tablet TAKE 1 TABLET DAILY 90 tablet 1  . lansoprazole (PREVACID) 30 MG capsule TAKE 1 CAPSULE DAILY 30 capsule 0   No facility-administered medications prior to visit.    Allergies:  Allergies  Allergen Reactions  . Sulfur Swelling  . Ibuprofen   . Penicillins       Review of Systems: See HPI for pertinent ROS. All other ROS negative.    Physical Exam: Blood pressure 124/84, pulse 68, temperature 98.1 F (36.7 C), temperature source Oral, resp. rate 20, weight 173 lb (78.472 kg)., Body mass index is 33.79 kg/(m^2). General:  Obese Afro-American female . Appears in no acute distress. HEENT: Normocephalic, atraumatic, eyes without discharge, sclera non-icteric, nares are without discharge. Bilateral  auditory canals clear, TM's are without perforation, pearly grey and translucent with reflective cone of light bilaterally. Oral cavity moist, posterior pharynx without exudate, erythema, peritonsillar abscess. No tenderness with percussion of frontal and maxillary sinuses bilaterally.  Neck: Supple. No thyromegaly. No lymphadenopathy. Lungs: Clear bilaterally to auscultation without wheezes, rales, or rhonchi. Breathing is unlabored. Lungs are clear. No wheezing. She has repetitive cough throughout the visit. Heart: Regular rhythm. No murmurs, rubs, or gallops. Abdomen: Soft, non-tender, non-distended with normoactive bowel sounds. No hepatomegaly. No rebound/guarding. No obvious abdominal masses. Msk:  Strength and tone normal for age. Extremities/Skin: Warm and dry. No clubbing or cyanosis. No edema. No rashes or suspicious lesions. Neuro: Alert and oriented X 3. Moves all extremities spontaneously. Gait is normal. CNII-XII grossly in tact. Psych:  Responds to questions appropriately with a normal affect.     ASSESSMENT AND PLAN:  49 y.o. year old female with  1. Bacterial respiratory infection She has repetitive cough throughout the visit. Will give her cough suppressant to use especially so that she can get some sleep. Well she is to take the Levaquin as directed and complete it. She can also use an expectorant during the day to keep the phlegm loose in her chest. Follow-up if symptoms do not resolve after completion of Levaquin. - levofloxacin (LEVAQUIN) 750 MG tablet; Take 1 tablet (750 mg total) by mouth daily.  Dispense: 7 tablet; Refill: 0 - chlorpheniramine-HYDROcodone (TUSSIONEX PENNKINETIC  ER) 10-8 MG/5ML LQCR; Take 5 mLs by mouth every 12 (twelve) hours as needed for cough.  Dispense: 115 mL; Refill: 0   Signed, [address] [place], [place], [facility] 02/03/2014 5:10 PM

## 2014-02-07 ENCOUNTER — Telehealth: Payer: Self-pay | Admitting: *Deleted

## 2014-02-07 NOTE — Telephone Encounter (Signed)
Forward provider who saw her

## 2014-02-07 NOTE — Telephone Encounter (Signed)
Pt called stating she was here on Thursday was given antibiotic levaquin for bronchitis and states can not take the medication d/t it making her sick, pt would like something else called in if possible please. Pt states that before calling something in please call her at work to see if can get the [facility]. Please advise!  [facility]

## 2014-02-07 NOTE — Telephone Encounter (Signed)
Did she try taking the Levaquin with some crackers? See if she can tolerate this.   If cannot take Levaquin, then----Doxycycline 100mg  1 po BID x 10 days # 20 + 0

## 2014-02-08 MED ORDER — DOXYCYCLINE HYCLATE 100 MG PO TABS: 100.0000 mg | ORAL_TABLET | Freq: Two times a day (BID) | ORAL | Status: DC

## 2014-02-08 NOTE — Telephone Encounter (Signed)
Pt called back.  Just can not tolerate Levaquin.  [facility]  Told to also take this with food

## 2014-02-24 ENCOUNTER — Telehealth: Payer: Self-pay | Admitting: Gastroenterology

## 2014-02-24 NOTE — Telephone Encounter (Signed)
Noted  

## 2014-02-24 NOTE — Telephone Encounter (Signed)
Pt called to speak with DS. Received a letter about her labs being due on 2/20 (Saturday) and wanted to know if that was the correct date. I told her that [facility] labs were open on Saturdays but not sure for how long. She said that she would go on Monday morning on her way into work.

## 2014-03-18 ENCOUNTER — Other Ambulatory Visit: Payer: Managed Care, Other (non HMO)

## 2014-03-18 ENCOUNTER — Other Ambulatory Visit: Payer: Self-pay | Admitting: Family Medicine

## 2014-03-18 DIAGNOSIS — I10 Essential (primary) hypertension: Secondary | ICD-10-CM

## 2014-03-18 DIAGNOSIS — Z79899 Other long term (current) drug therapy: Secondary | ICD-10-CM

## 2014-03-18 DIAGNOSIS — Z0000 Encounter for general adult medical examination without abnormal findings: Secondary | ICD-10-CM

## 2014-03-18 DIAGNOSIS — D509 Iron deficiency anemia, unspecified: Secondary | ICD-10-CM

## 2014-03-18 LAB — CBC WITH DIFFERENTIAL/PLATELET
Basophils Absolute: 0 10*3/uL (ref 0.0–0.1)
Basophils Relative: 1 % (ref 0–1)
EOS ABS: 0.3 10*3/uL (ref 0.0–0.7)
Eosinophils Relative: 7 % — ABNORMAL HIGH (ref 0–5)
HEMATOCRIT: 38.1 % (ref 36.0–46.0)
Hemoglobin: 12.8 g/dL (ref 12.0–15.0)
LYMPHS PCT: 41 % (ref 12–46)
Lymphs Abs: 1.9 10*3/uL (ref 0.7–4.0)
MCH: 27.4 pg (ref 26.0–34.0)
MCHC: 33.6 g/dL (ref 30.0–36.0)
MCV: 81.6 fL (ref 78.0–100.0)
MONOS PCT: 9 % (ref 3–12)
MPV: 10.2 fL (ref 8.6–12.4)
Monocytes Absolute: 0.4 10*3/uL (ref 0.1–1.0)
NEUTROS ABS: 1.9 10*3/uL (ref 1.7–7.7)
Neutrophils Relative %: 42 % — ABNORMAL LOW (ref 43–77)
Platelets: 283 10*3/uL (ref 150–400)
RBC: 4.67 MIL/uL (ref 3.87–5.11)
RDW: 13.7 % (ref 11.5–15.5)
WBC: 4.6 10*3/uL (ref 4.0–10.5)

## 2014-03-18 LAB — COMPLETE METABOLIC PANEL WITH GFR
ALT: 44 U/L — AB (ref 0–35)
AST: 47 U/L — AB (ref 0–37)
Albumin: 4.2 g/dL (ref 3.5–5.2)
Alkaline Phosphatase: 61 U/L (ref 39–117)
BILIRUBIN TOTAL: 0.4 mg/dL (ref 0.2–1.2)
BUN: 17 mg/dL (ref 6–23)
CO2: 27 mEq/L (ref 19–32)
CREATININE: 0.75 mg/dL (ref 0.50–1.10)
Calcium: 9.6 mg/dL (ref 8.4–10.5)
Chloride: 105 mEq/L (ref 96–112)
GFR, Est African American: >89 mL/min
GFR, Est Non African American: >89 mL/min
Glucose, Bld: 99 mg/dL (ref 70–99)
Potassium: 3.7 mEq/L (ref 3.5–5.3)
Sodium: 142 mEq/L (ref 135–145)
Total Protein: 7 g/dL (ref 6.0–8.3)

## 2014-03-18 LAB — LIPID PANEL
CHOL/HDL RATIO: 5 ratio
Cholesterol: 186 mg/dL (ref 0–200)
HDL: 37 mg/dL — AB (ref >=46)
LDL CALC: 97 mg/dL (ref 0–99)
Triglycerides: 259 mg/dL — ABNORMAL HIGH (ref <150)
VLDL: 52 mg/dL — AB (ref 0–40)

## 2014-03-18 NOTE — Telephone Encounter (Signed)
Refill appropriate and filled per protocol. 

## 2014-03-23 ENCOUNTER — Encounter: Payer: Self-pay | Admitting: Family Medicine

## 2014-03-23 ENCOUNTER — Ambulatory Visit (INDEPENDENT_AMBULATORY_CARE_PROVIDER_SITE_OTHER): Payer: Managed Care, Other (non HMO) | Admitting: Family Medicine

## 2014-03-23 VITALS — BP 126/82 | HR 68 | Temp 97.9°F | Resp 14 | Ht 60.0 in | Wt 171.0 lb

## 2014-03-23 DIAGNOSIS — R7989 Other specified abnormal findings of blood chemistry: Secondary | ICD-10-CM

## 2014-03-23 DIAGNOSIS — K760 Fatty (change of) liver, not elsewhere classified: Secondary | ICD-10-CM

## 2014-03-23 DIAGNOSIS — K219 Gastro-esophageal reflux disease without esophagitis: Secondary | ICD-10-CM

## 2014-03-23 DIAGNOSIS — I10 Essential (primary) hypertension: Secondary | ICD-10-CM

## 2014-03-23 DIAGNOSIS — E669 Obesity, unspecified: Secondary | ICD-10-CM

## 2014-03-23 DIAGNOSIS — E781 Pure hyperglyceridemia: Secondary | ICD-10-CM | POA: Diagnosis not present

## 2014-03-23 DIAGNOSIS — R945 Abnormal results of liver function studies: Secondary | ICD-10-CM

## 2014-03-23 MED ORDER — HYDROCHLOROTHIAZIDE 25 MG PO TABS: 25.0000 mg | ORAL_TABLET | Freq: Every day | ORAL | Status: DC

## 2014-03-23 MED ORDER — PHENTERMINE HCL 37.5 MG PO TABS
37.5000 mg | ORAL_TABLET | Freq: Every day | ORAL | Status: DC
Start: 2014-03-23 — End: 2014-06-21

## 2014-03-23 MED ORDER — PANTOPRAZOLE SODIUM 40 MG PO TBEC: 40.0000 mg | DELAYED_RELEASE_TABLET | Freq: Every day | ORAL | Status: DC

## 2014-03-23 NOTE — Assessment & Plan Note (Signed)
Trial of fish oil thousand milligrams twice a day

## 2014-03-23 NOTE — Assessment & Plan Note (Signed)
She has increased risk for coronary artery disease and cerebrovascular disease with her hypertension no elevated triglycerides and her obesity she would like to try the phentermine therefore I prescribed 37.5 mg she was start with half a tablet once a day for 2 weeks and then increase to 1 tablet if tolerable. We discussed low-calorie low-fat diet is well and she is going to walk up to 5 days a week

## 2014-03-23 NOTE — Assessment & Plan Note (Signed)
Fatty liver disease is noted with mild elevation in her LFTs discussed per above regarding the cholesterol

## 2014-03-23 NOTE — Assessment & Plan Note (Signed)
Blood pressure well controlled continue current medication 

## 2014-03-23 NOTE — Patient Instructions (Signed)
For the phentermine take 1/2 tablet in the morning, for 2 weeks, then increase to 1 full tablet Continue blood pressure pill Fish oil over the counter 1 capsule ( 1000mg ) twice a day  Try the Protonix for your heartburn F/U 2 months

## 2014-03-23 NOTE — Progress Notes (Signed)
Patient ID: Adriana Ramsey, female   DOB: 05/05/1965, 49 y.o.   MRN: [id number]   Subjective:    Patient ID: Adriana Ramsey, female    DOB: 11/12/1965, 49 y.o.   MRN: [id number]  Patient presents for Med check  patient to follow-up medications and review her labs. She's been doing fairly well. She states that she's been trying to lose weight but is having difficulty would like to try the diet pill this time. She never did give her the good try when I prescribed it a year ago. Regarding her carpal tunnel symptoms they have called down therefore she does not want to proceed with any further testing at this time. Her medications were reviewed.  Her labs did show mild elevation in her liver function tests she did take a few days of Tylenol but otherwise has been monitoring her intake    Review Of Systems:  GEN- denies fatigue, fever, weight loss,weakness, recent illness HEENT- denies eye drainage, change in vision, nasal discharge, CVS- denies chest pain, palpitations RESP- denies SOB, cough, wheeze ABD- denies N/V, change in stools, abd pain GU- denies dysuria, hematuria, dribbling, incontinence MSK- denies joint pain, muscle aches, injury Neuro- denies headache, dizziness, syncope, seizure activity       Objective:    BP 126/82 mmHg  Pulse 68  Temp(Src) 97.9 F (36.6 C) (Oral)  Resp 14  Ht 5' (1.524 m)  Wt 171 lb (77.565 kg)  BMI 33.40 kg/m2 GEN- NAD, alert and oriented x3 HEENT- PERRL, EOMI, non injected sclera, pink conjunctiva, MMM, oropharynx clear CVS- RRR, no murmur RESP-CTAB EXT- No edema Pulses- Radial 2+        Assessment & Plan:      Problem List Items Addressed This Visit      Unprioritized   Obesity (BMI 30-39.9)    She has increased risk for coronary artery disease and cerebrovascular disease with her hypertension no elevated triglycerides and her obesity she would like to try the phentermine therefore I prescribed 37.5 mg she was start with  half a tablet once a day for 2 weeks and then increase to 1 tablet if tolerable. We discussed low-calorie low-fat diet is well and she is going to walk up to 5 days a week      Relevant Medications   phentermine (ADIPEX-P) 37.5 MG tablet   Hypertriglyceridemia    Trial of fish oil thousand milligrams twice a day      Relevant Medications   hydrochlorothiazide tablet   GERD (gastroesophageal reflux disease)    The Prevacid is no longer helping and she is also tried Tums over-the-counter would try her on Protonix      Relevant Medications   pantoprazole (PROTONIX) EC tablet   Fatty liver    Fatty liver disease is noted with mild elevation in her LFTs discussed per above regarding the cholesterol      Essential hypertension, benign - Primary    Blood pressure well controlled continue current medication      Relevant Medications   hydrochlorothiazide tablet   Elevated LFTs      Note: This dictation was prepared with Dragon dictation along with smaller phrase technology. Any transcriptional errors that result from this process are unintentional.

## 2014-03-23 NOTE — Assessment & Plan Note (Signed)
The Prevacid is no longer helping and she is also tried Tums over-the-counter would try her on Protonix

## 2014-03-25 ENCOUNTER — Other Ambulatory Visit: Payer: Self-pay | Admitting: *Deleted

## 2014-04-07 ENCOUNTER — Telehealth: Payer: Self-pay | Admitting: Family Medicine

## 2014-04-07 MED ORDER — FLUCONAZOLE 150 MG PO TABS: 150.0000 mg | ORAL_TABLET | Freq: Once | ORAL | Status: DC

## 2014-04-07 NOTE — Telephone Encounter (Signed)
Call placed to patient.   Reports that she has thick white discharge and vaginal itching.   Diflucan sent to the pharmacy. Advised that if symptoms persist that she would need OV for pelvic exam.

## 2014-04-07 NOTE — Telephone Encounter (Signed)
[phone]  [facility]  Patient is calling to see if a diflucan can be called in for her by chance, she said please call her for details

## 2014-05-03 ENCOUNTER — Telehealth: Payer: Self-pay | Admitting: Family Medicine

## 2014-05-03 NOTE — Telephone Encounter (Signed)
Patient called this afternoon to check the status of her form that she dropped off yesterday. She states that it is very important for her to get it back before 05/06/14 so her husbands insurance doesn't go up. "it would be bad if I couldn't get a little form filled out and his insurance was to go up ; she knows you are a busy lady." I advised patient that we request 7-10 business days for forms to be filled out but I would leave a message for Dr. Jeanice Limurham with her need. This form is on Dr. Deirdre Peerurham's desk.

## 2014-05-03 NOTE — Telephone Encounter (Signed)
Forwarded to MD.

## 2014-05-11 ENCOUNTER — Encounter: Payer: Self-pay | Admitting: Family Medicine

## 2014-05-23 ENCOUNTER — Ambulatory Visit: Payer: Managed Care, Other (non HMO) | Admitting: Family Medicine

## 2014-06-13 ENCOUNTER — Encounter: Payer: Self-pay | Admitting: Family Medicine

## 2014-06-21 ENCOUNTER — Ambulatory Visit (INDEPENDENT_AMBULATORY_CARE_PROVIDER_SITE_OTHER): Payer: Managed Care, Other (non HMO) | Admitting: Family Medicine

## 2014-06-21 ENCOUNTER — Encounter: Payer: Self-pay | Admitting: Family Medicine

## 2014-06-21 VITALS — BP 130/82 | HR 68 | Temp 98.5°F | Resp 14 | Ht 60.0 in | Wt 174.0 lb

## 2014-06-21 DIAGNOSIS — K760 Fatty (change of) liver, not elsewhere classified: Secondary | ICD-10-CM | POA: Diagnosis not present

## 2014-06-21 DIAGNOSIS — Z1321 Encounter for screening for nutritional disorder: Secondary | ICD-10-CM | POA: Diagnosis not present

## 2014-06-21 DIAGNOSIS — K589 Irritable bowel syndrome without diarrhea: Secondary | ICD-10-CM

## 2014-06-21 DIAGNOSIS — R300 Dysuria: Secondary | ICD-10-CM | POA: Diagnosis not present

## 2014-06-21 DIAGNOSIS — E781 Pure hyperglyceridemia: Secondary | ICD-10-CM | POA: Diagnosis not present

## 2014-06-21 DIAGNOSIS — E669 Obesity, unspecified: Secondary | ICD-10-CM

## 2014-06-21 DIAGNOSIS — I10 Essential (primary) hypertension: Secondary | ICD-10-CM

## 2014-06-21 LAB — URINALYSIS, ROUTINE W REFLEX MICROSCOPIC
Bilirubin Urine: NEGATIVE
Glucose, UA: NEGATIVE mg/dL
Ketones, ur: NEGATIVE mg/dL
Leukocytes, UA: NEGATIVE
Nitrite: NEGATIVE
Protein, ur: NEGATIVE mg/dL
Specific Gravity, Urine: 1.015 (ref 1.005–1.030)
UROBILINOGEN UA: 0.2 mg/dL (ref 0.0–1.0)
pH: 6.5 (ref 5.0–8.0)

## 2014-06-21 LAB — URINALYSIS, MICROSCOPIC ONLY
CASTS: NONE SEEN
Crystals: NONE SEEN
WBC UA: NONE SEEN WBC/hpf (ref <3)

## 2014-06-21 LAB — COMPREHENSIVE METABOLIC PANEL
ALT: 36 U/L — ABNORMAL HIGH (ref 0–35)
AST: 30 U/L (ref 0–37)
Albumin: 4.7 g/dL (ref 3.5–5.2)
Alkaline Phosphatase: 65 U/L (ref 39–117)
BUN: 11 mg/dL (ref 6–23)
CO2: 29 mEq/L (ref 19–32)
Calcium: 9.8 mg/dL (ref 8.4–10.5)
Chloride: 101 mEq/L (ref 96–112)
Creat: 0.74 mg/dL (ref 0.50–1.10)
Glucose, Bld: 97 mg/dL (ref 70–99)
Potassium: 3.5 mEq/L (ref 3.5–5.3)
Sodium: 139 mEq/L (ref 135–145)
Total Bilirubin: 0.8 mg/dL (ref 0.2–1.2)
Total Protein: 7.5 g/dL (ref 6.0–8.3)

## 2014-06-21 LAB — CBC WITH DIFFERENTIAL/PLATELET
BASOS ABS: 0 10*3/uL (ref 0.0–0.1)
Basophils Relative: 1 % (ref 0–1)
Eosinophils Absolute: 0.3 10*3/uL (ref 0.0–0.7)
Eosinophils Relative: 7 % — ABNORMAL HIGH (ref 0–5)
HCT: 38.7 % (ref 36.0–46.0)
Hemoglobin: 13 g/dL (ref 12.0–15.0)
LYMPHS PCT: 42 % (ref 12–46)
Lymphs Abs: 1.7 10*3/uL (ref 0.7–4.0)
MCH: 27 pg (ref 26.0–34.0)
MCHC: 33.6 g/dL (ref 30.0–36.0)
MCV: 80.3 fL (ref 78.0–100.0)
MPV: 10.4 fL (ref 8.6–12.4)
Monocytes Absolute: 0.4 10*3/uL (ref 0.1–1.0)
Monocytes Relative: 10 % (ref 3–12)
NEUTROS ABS: 1.6 10*3/uL — AB (ref 1.7–7.7)
Neutrophils Relative %: 40 % — ABNORMAL LOW (ref 43–77)
Platelets: 294 10*3/uL (ref 150–400)
RBC: 4.82 MIL/uL (ref 3.87–5.11)
RDW: 13.2 % (ref 11.5–15.5)
WBC: 4.1 10*3/uL (ref 4.0–10.5)

## 2014-06-21 LAB — LIPID PANEL
Cholesterol: 212 mg/dL — ABNORMAL HIGH (ref 0–200)
HDL: 46 mg/dL (ref >=46)
LDL Cholesterol: 134 mg/dL — ABNORMAL HIGH (ref 0–99)
Total CHOL/HDL Ratio: 4.6 Ratio
Triglycerides: 158 mg/dL — ABNORMAL HIGH (ref <150)
VLDL: 32 mg/dL (ref 0–40)

## 2014-06-21 LAB — TSH: TSH: 2.321 u[IU]/mL (ref 0.350–4.500)

## 2014-06-21 NOTE — Patient Instructions (Signed)
Continue current medications We will call with lab results Work on diet and weight loss Restora F/U 6 months for physical

## 2014-06-22 LAB — HEMOGLOBIN A1C
HEMOGLOBIN A1C: 6.1 % — AB (ref <5.7)
MEAN PLASMA GLUCOSE: 128 mg/dL — AB (ref <117)

## 2014-06-22 LAB — VITAMIN D 25 HYDROXY (VIT D DEFICIENCY, FRACTURES): Vit D, 25-Hydroxy: 12 ng/mL — ABNORMAL LOW (ref 30–100)

## 2014-06-22 NOTE — Assessment & Plan Note (Signed)
Discussed the importance of weight loss. She plans to start the phentermine she has other risk factors which are putting her at high risk for coronary artery disease history is hypertension hyperlipidemia and fatty liver disease

## 2014-06-22 NOTE — Assessment & Plan Note (Signed)
I have given her prescription for her pharmacy to order the restora probiotic

## 2014-06-22 NOTE — Assessment & Plan Note (Signed)
Is possible that her spell yesterday was related to her blood pressure but most likely her hypoglycemic state. She is in her normal state of health today. I'm going to check her labs. I did not change her blood pressure medicine as his looks good. I am getting a check an A1c due to the fluctuating sugars

## 2014-06-22 NOTE — Progress Notes (Signed)
Patient ID: Adriana Ramsey, female   DOB: 1965-10-21, 49 y.o.   MRN: [id number]   Subjective:    Patient ID: Adriana Ramsey, female    DOB: November 22, 1965, 49 y.o.   MRN: [id number]  Patient presents for HTN and Dysuria  patient here to follow-up medications. Of note she did not start the phentermine and her weight is up 3 pounds. She states that she will start it soon. Yesterday she had a spell at work where she had a headache felt dizzy like the room was spinning she had not eaten anything for many hours. She states that she took her blood pressure on a digital recorder at her job was 144 of 130 minutes later after she sat down her blood pressure came down to 130/89. Her blood pressure has been running good otherwise.  She also has problems with IBS which is known. She wanted to restart the probiotics.  She also noted at the end of the physical that she has had some urinary frequency no significant burning with urination no blood in the urine.    Review Of Systems:  GEN- denies fatigue, fever, weight loss,weakness, recent illness HEENT- denies eye drainage, change in vision, nasal discharge, CVS- denies chest pain, palpitations RESP- denies SOB, cough, wheeze ABD- denies N/V, change in stools, abd pain GU- + dysuria, hematuria, dribbling, incontinence MSK- denies joint pain, muscle aches, injury Neuro- denies headache,+ dizziness, syncope, seizure activity       Objective:    BP 130/82 mmHg  Pulse 68  Temp(Src) 98.5 F (36.9 C) (Oral)  Resp 14  Ht 5' (1.524 m)  Wt 174 lb (78.926 kg)  BMI 33.98 kg/m2 GEN- NAD, alert and oriented x3, well appearing  HEENT- PERRL, EOMI, non injected sclera, pink conjunctiva, MMM, oropharynx clear,nares clear, no sinus tenderness Neck- Supple, no thyromegaly CVS- RRR, no murmur RESP-CTAB ABD-NABS,soft,NT,ND, no CVA tenderness Neuro-CNII-XIi intac,t no focal deficits  EXT- No edema Pulses- Radial,2+        Assessment & Plan:       Problem List Items Addressed This Visit    Obesity (BMI 30-39.9)    Discussed the importance of weight loss. She plans to start the phentermine she has other risk factors which are putting her at high risk for coronary artery disease history is hypertension hyperlipidemia and fatty liver disease      Relevant Orders   Hemoglobin A1c (Completed)   IBS (irritable bowel syndrome)    I have given her prescription for her pharmacy to order the restora probiotic      Hypertriglyceridemia   Relevant Orders   Lipid panel (Completed)   Fatty liver    Recheck fasting lipid panel and liver function test      Essential hypertension, benign    Is possible that her spell yesterday was related to her blood pressure but most likely her hypoglycemic state. She is in her normal state of health today. I'm going to check her labs. I did not change her blood pressure medicine as his looks good. I am getting a check an A1c due to the fluctuating sugars      Relevant Orders   CBC with Differential/Platelet (Completed)   Comprehensive metabolic panel (Completed)   TSH (Completed)    Other Visit Diagnoses    Dysuria    -  Primary    UA negative    Relevant Orders    Urinalysis, Routine w reflex microscopic (not at [facility]) (Completed)    Encounter  for vitamin deficiency screening        Relevant Orders    Vitamin D, 25-hydroxy (Completed)       Note: This dictation was prepared with Dragon dictation along with smaller phrase technology. Any transcriptional errors that result from this process are unintentional.

## 2014-06-22 NOTE — Assessment & Plan Note (Signed)
Recheck fasting lipid panel and liver function test

## 2014-06-23 MED ORDER — VITAMIN D (ERGOCALCIFEROL) 1.25 MG (50000 UNIT) PO CAPS: 50000.0000 [IU] | ORAL_CAPSULE | ORAL | Status: DC

## 2014-06-23 NOTE — Addendum Note (Signed)
Addended by: Milinda Antis F on: 06/23/2014 10:50 AM   Modules accepted: Orders

## 2014-07-14 ENCOUNTER — Encounter: Payer: Self-pay | Admitting: Family Medicine

## 2014-09-22 ENCOUNTER — Encounter: Payer: Self-pay | Admitting: Family Medicine

## 2014-10-01 ENCOUNTER — Other Ambulatory Visit: Payer: Self-pay | Admitting: Family Medicine

## 2014-10-03 NOTE — Telephone Encounter (Signed)
Refill appropriate and filled per protocol. 

## 2014-11-07 IMAGING — US US ABDOMEN COMPLETE
1 series · 14 of 25 positions shown · non-contrast
Comparison: Ultrasound November 24, 2009.

CLINICAL DATA: Elevated liver function tests.

EXAM:
ULTRASOUND ABDOMEN COMPLETE

[Series 1: us abdomen complete · 0.22mm/px · 14 of 120 slices shown]
[im 1/120]
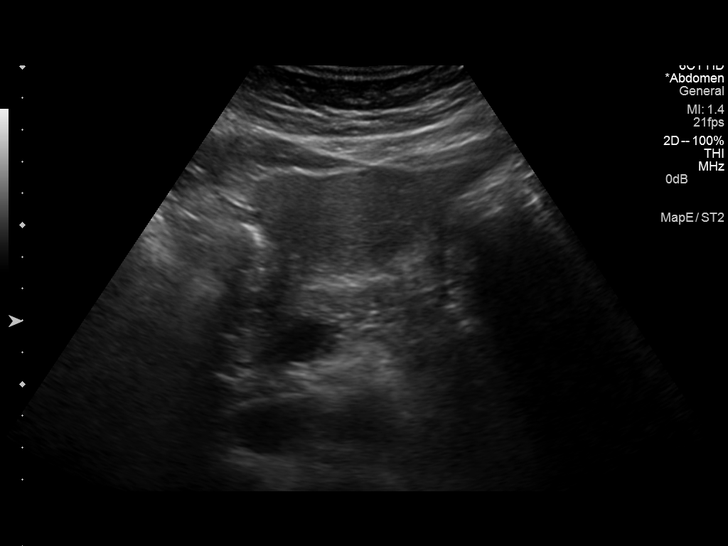
[im 10/120]
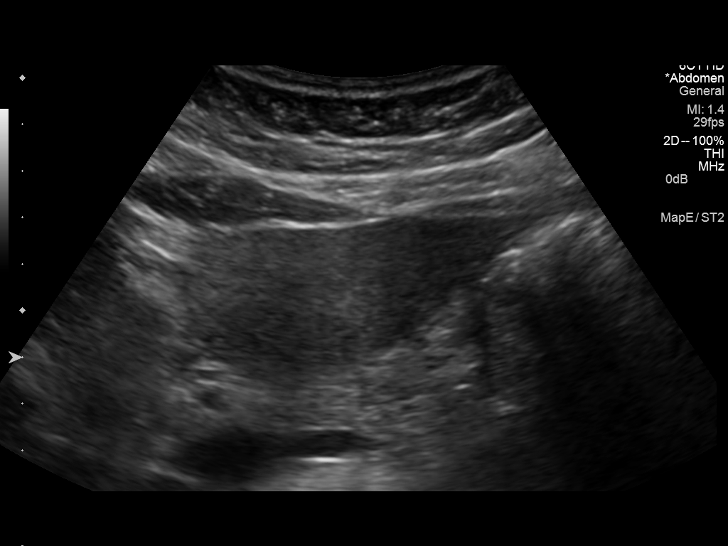
[im 20/120]
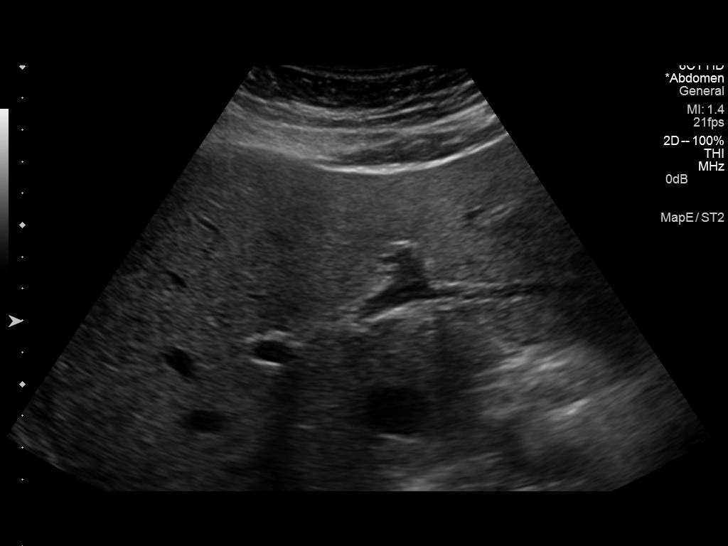
[im 30/120]
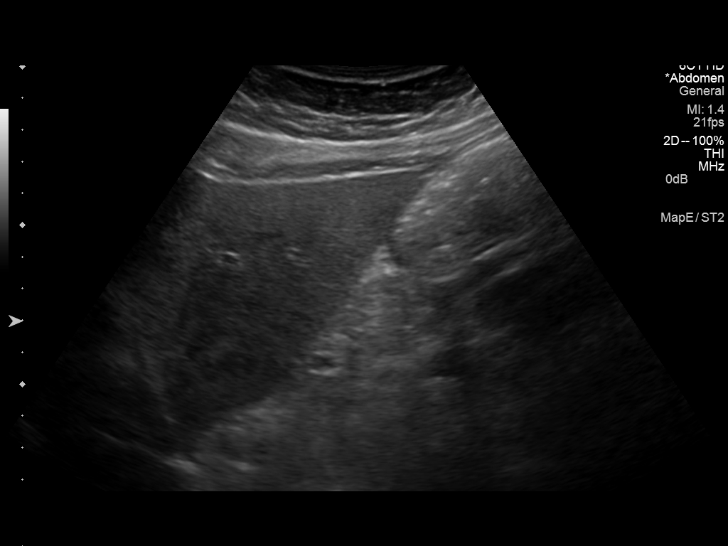
[im 40/120]
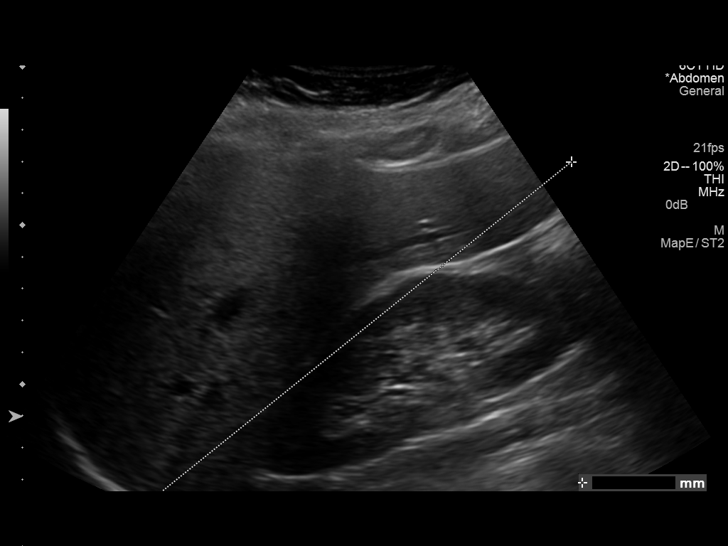
[im 45/120]
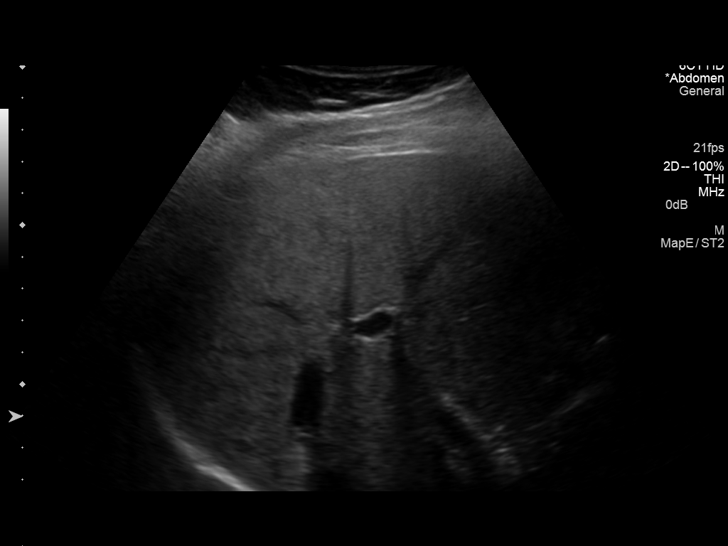
[im 55/120]
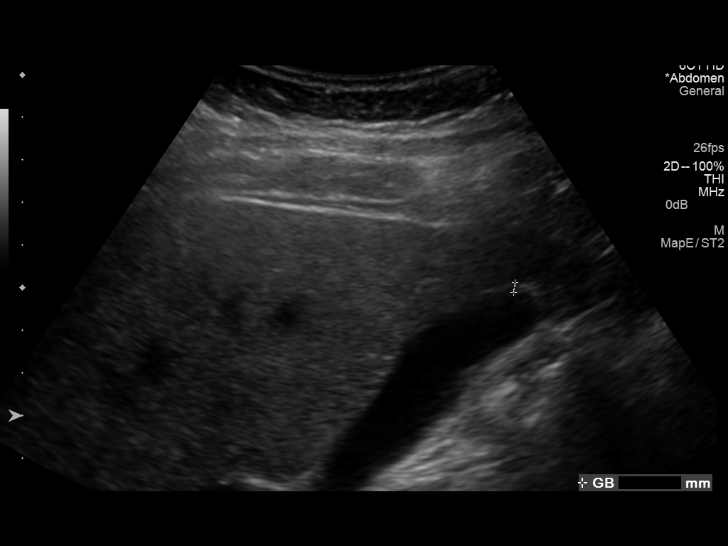
[im 65/120]
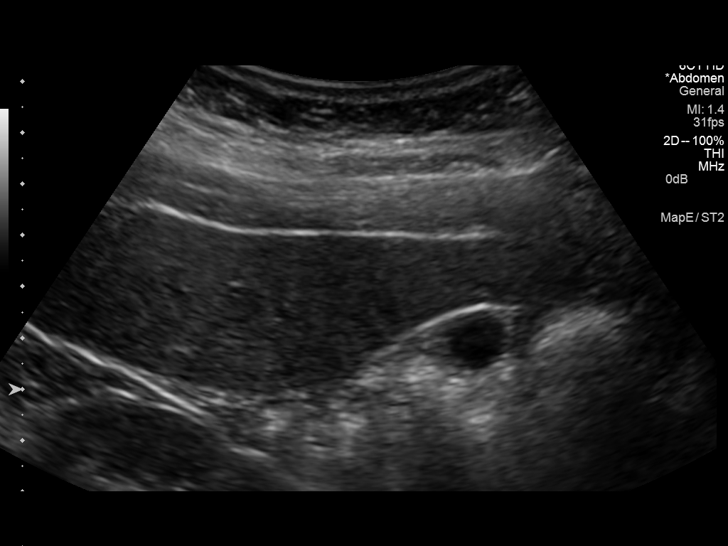
[im 75/120]
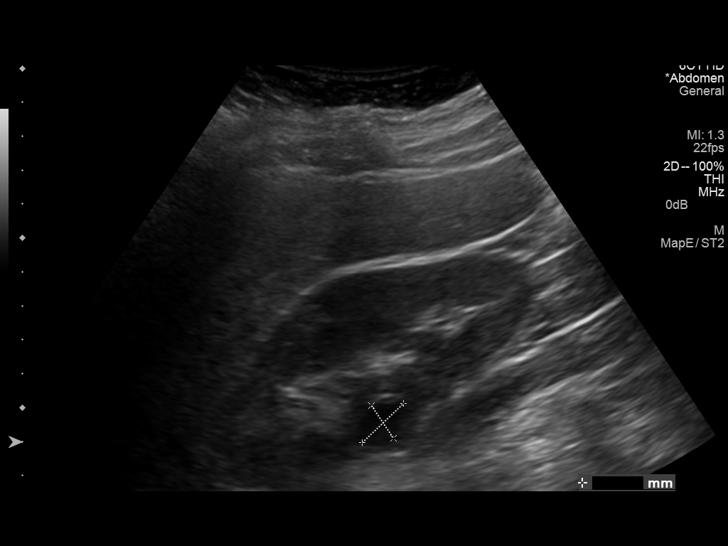
[im 80/120]
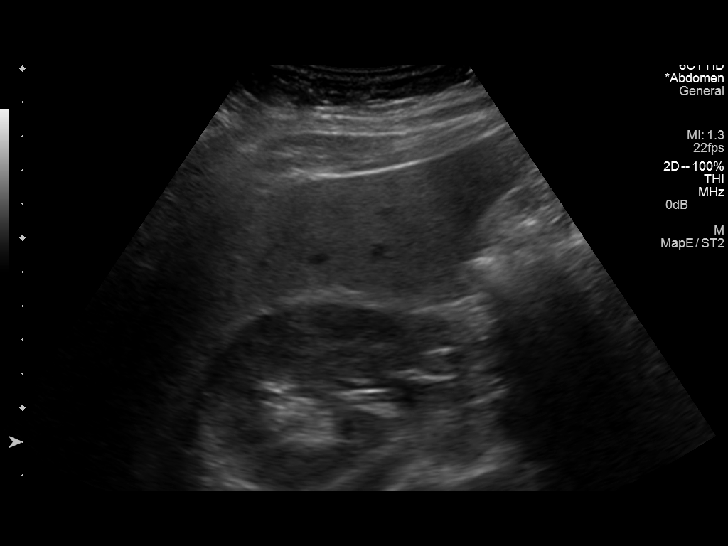
[im 90/120]
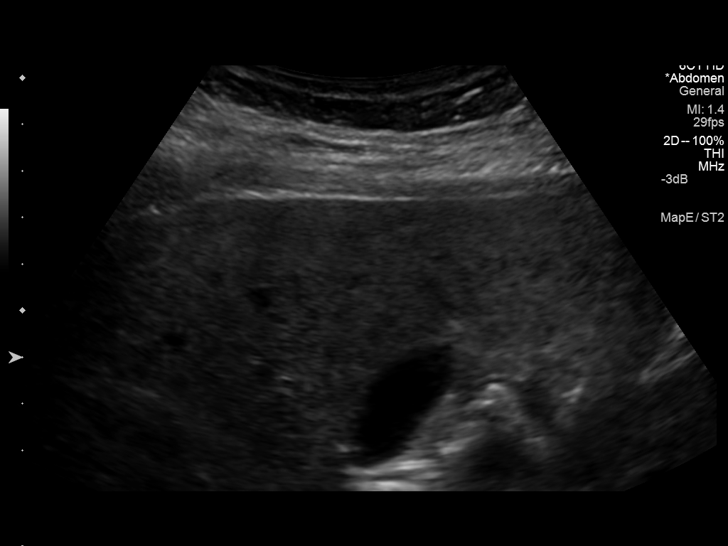
[im 100/120]
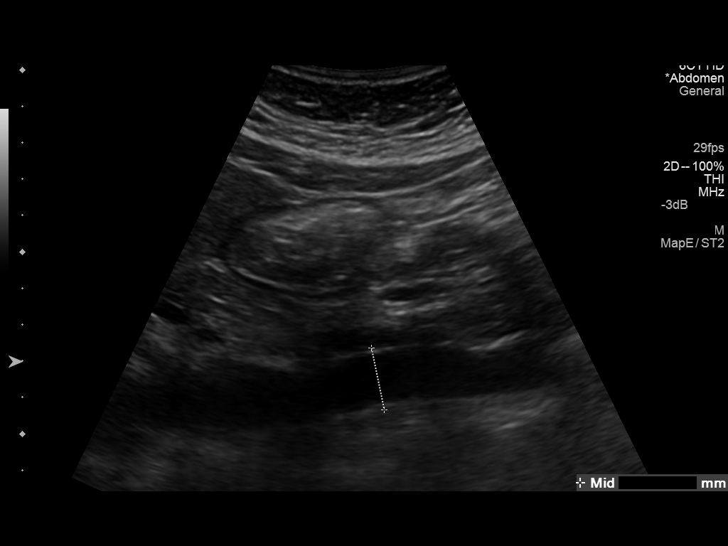
[im 110/120]
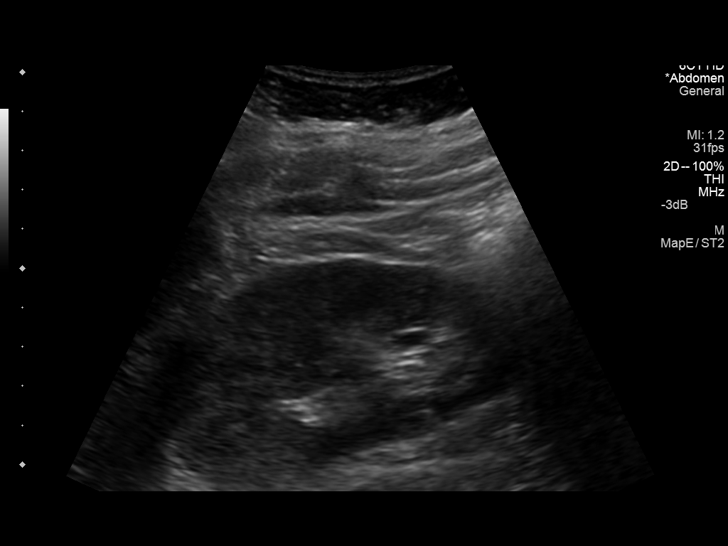
[im 120/120]
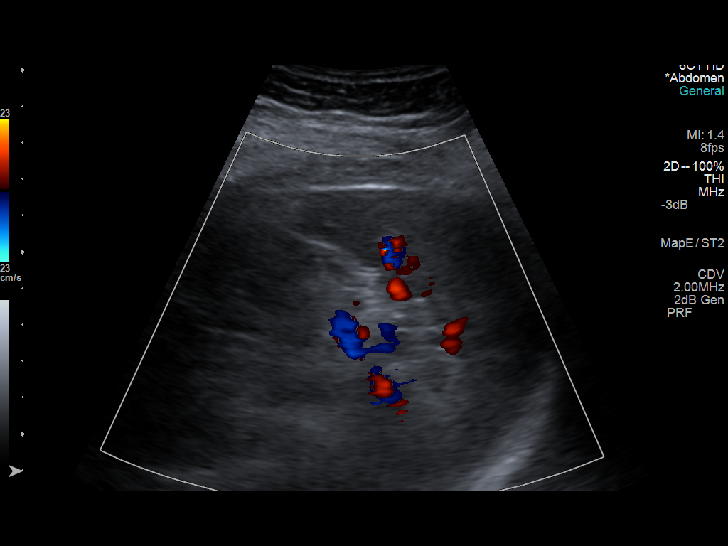

[14 of 25 positions shown; findings below may reference images not displayed]

FINDINGS: Gallbladder: No gallstones or wall thickening visualized. No
sonographic Murphy sign noted. No gallbladder polyp is noted.

Common bile duct: Diameter: 3.6 mm which is within normal limits.

Liver: No focal lesion identified. Increased echogenicity of hepatic
parenchyma is noted suggesting fatty infiltration.

IVC: No abnormality visualized.

Pancreas: Visualized portion unremarkable.

Spleen: Size and appearance within normal limits.

Right Kidney: Length: 11.6 cm. 1.7 cm cyst is seen in midpole.
Echogenicity within normal limits. No mass or hydronephrosis
visualized.

Left Kidney: Length: 10.9 cm. Echogenicity within normal limits. No
mass or hydronephrosis visualized.

Abdominal aorta: No aneurysm visualized.

Other findings: None.
IMPRESSION: Fatty infiltration of the liver. No cholelithiasis or evidence of
cholecystitis is noted. No other significant abnormality seen in the
abdomen.

## 2014-11-14 ENCOUNTER — Encounter: Payer: Self-pay | Admitting: Family Medicine

## 2014-12-21 ENCOUNTER — Other Ambulatory Visit: Payer: Managed Care, Other (non HMO)

## 2014-12-23 ENCOUNTER — Encounter: Payer: Managed Care, Other (non HMO) | Admitting: Family Medicine

## 2015-09-06 ENCOUNTER — Ambulatory Visit (INDEPENDENT_AMBULATORY_CARE_PROVIDER_SITE_OTHER): Payer: Managed Care, Other (non HMO) | Admitting: Physician Assistant

## 2015-09-06 ENCOUNTER — Encounter: Payer: Self-pay | Admitting: Physician Assistant

## 2015-09-06 VITALS — BP 142/86 | HR 86 | Temp 98.3°F | Resp 16 | Wt 174.0 lb

## 2015-09-06 DIAGNOSIS — K219 Gastro-esophageal reflux disease without esophagitis: Secondary | ICD-10-CM

## 2015-09-06 DIAGNOSIS — I10 Essential (primary) hypertension: Secondary | ICD-10-CM

## 2015-09-06 DIAGNOSIS — M25511 Pain in right shoulder: Secondary | ICD-10-CM

## 2015-09-06 DIAGNOSIS — B349 Viral infection, unspecified: Secondary | ICD-10-CM

## 2015-09-06 DIAGNOSIS — B9789 Other viral agents as the cause of diseases classified elsewhere: Secondary | ICD-10-CM

## 2015-09-06 DIAGNOSIS — J988 Other specified respiratory disorders: Secondary | ICD-10-CM

## 2015-09-06 MED ORDER — PANTOPRAZOLE SODIUM 40 MG PO TBEC: 40.0000 mg | DELAYED_RELEASE_TABLET | Freq: Every day | ORAL | 0 refills | Status: DC

## 2015-09-06 MED ORDER — HYDROCHLOROTHIAZIDE 25 MG PO TABS: 25.0000 mg | ORAL_TABLET | Freq: Every day | ORAL | 0 refills | Status: DC

## 2015-09-07 ENCOUNTER — Telehealth: Payer: Self-pay

## 2015-09-07 MED ORDER — LANSOPRAZOLE 30 MG PO CPDR: 30.0000 mg | DELAYED_RELEASE_CAPSULE | Freq: Every day | ORAL | 5 refills | Status: DC

## 2015-09-07 NOTE — Addendum Note (Signed)
Addended by: Phineas SemenJOHNSON, TIFFANY A on: 09/07/2015 05:08 PM   Modules accepted: Orders

## 2015-09-07 NOTE — Telephone Encounter (Signed)
rx sent to pharmacy. Notified pt

## 2015-09-07 NOTE — Telephone Encounter (Signed)
That is fine. Please remove the protonix from the medicine list and order the lansoprazole --can give #30+5 refills.

## 2015-09-07 NOTE — Telephone Encounter (Signed)
Pt  States during office visit 8/30 rx medication pantoprazole 40 mg was prescribed  She states she took 1 tablet and had bad headaches Pt wants rx lansoprazole dr 30 mg called in.

## 2015-09-07 NOTE — Progress Notes (Signed)
Patient ID: Adriana Ramsey MRN: [id number], DOB: 11-09-1965, 50 y.o. Date of Encounter: @DATE @  Chief Complaint:  Chief Complaint  Patient presents with  . Follow-up    right arm pain with scale of 8 started towards end of June,deep cough  yellowish mucus that comes up , chest pain with  cough    HPI: 50 y.o. year old AA female  presents with her husband for OV today.   Reviewed that her last office visit here in our office was with Dr. Jeanice Lim 06/21/2014--- reviewed with patient and husband that this was over a year ago. Pt reports that the reason she did not come in was that they got busy with some issues.  Says that her daughter-- at age 80 years old-- who also had a 64-year-old of her own--- was diagnosed with thyroid cancer.  She had surgery and then 2 weeks later had a second surgery. Says the good news is that she is now cancer free.  Says that that was a difficult time. Says that her daughter was in grad school at the time but she has finished and did graduate.  However because of all of this she did not come in for follow-up here and has been out of her blood pressure medicine for over a month. As well she is not taking any vitamin D. She says that this congestion just started yesterday in her head and nose and today seems to be in her chest. Had none of these symptoms prior to yesterday. Has had no sore throat. No earache. No fevers or chills. Says that for over a month she has been having achy discomfort in her right shoulder and right upper arm. Does not know of any particular activity that she had done to cause this and had no trauma or injury. Says that she also needs to get back on prescription medicine for her heartburn.  No other complaints or concerns today.   Past Medical History:  Diagnosis Date  . Allergy   . Gallbladder polyp 2012  . GERD (gastroesophageal reflux disease)   . Hypertension   . PUD (peptic ulcer disease) 2011     Home Meds: Outpatient  Medications Prior to Visit  Medication Sig Dispense Refill  . EPINEPHrine (EPI-PEN) 0.3 mg/0.3 mL DEVI Inject 0.3 mLs (0.3 mg total) into the muscle once. 2 Device 1  . Vitamin D, Ergocalciferol, (DRISDOL) 50000 UNITS CAPS capsule Take 1 capsule (50,000 Units total) by mouth every 7 (seven) days. (Patient not taking: Reported on 09/06/2015) 4 capsule 2  . hydrochlorothiazide (HYDRODIURIL) 25 MG tablet TAKE 1 TABLET DAILY (Patient not taking: Reported on 09/06/2015) 90 tablet 0  . Multiple Vitamins-Minerals (MULTIVITAMIN WITH MINERALS) tablet Take 1 tablet by mouth daily.    . pantoprazole (PROTONIX) 40 MG tablet Take 1 tablet (40 mg total) by mouth daily. (Patient not taking: Reported on 09/06/2015) 30 tablet 3   No facility-administered medications prior to visit.     Allergies:  Allergies  Allergen Reactions  . Sulfur Swelling  . Ibuprofen   . Penicillins     Social History   Social History  . Marital status: Married    Spouse name: N/A  . Number of children: 2  . Years of education: N/A   Occupational History  . billing specialist [employer]   Social History Main Topics  . Smoking status: Never Smoker  . Smokeless tobacco: Not on file  . Alcohol use Yes  Comment: wine 1 glass per week  . Drug use: No  . Sexual activity: Yes    Partners: Male   Other Topics Concern  . Not on file   Social History Narrative   Lives w/ husband & 1 son, 1 grown daughter    Family History  Problem Relation Age of Onset  . Hyperlipidemia Mother   . Seizures Mother      Review of Systems:  See HPI for pertinent ROS. All other ROS negative.    Physical Exam: Blood pressure (!) 142/86, pulse 86, temperature 98.3 F (36.8 C), temperature source Oral, resp. rate 16, weight 174 lb (78.9 kg), SpO2 97 %., Body mass index is 33.98 kg/m. General: Appears in no acute distress. Head: Normocephalic, atraumatic, eyes without discharge, sclera non-icteric, nares are without  discharge. Bilateral auditory canals clear, TM's are without perforation, pearly grey and translucent with reflective cone of light bilaterally. Oral cavity moist, posterior pharynx without exudate, erythema, peritonsillar abscess.  Neck: Supple. No thyromegaly. No lymphadenopathy. Lungs: Clear bilaterally to auscultation without wheezes, rales, or rhonchi. Breathing is unlabored. Heart: RRR with S1 S2. No murmurs, rubs, or gallops. Musculoskeletal:  Strength and tone normal for age. Right Shoulder: When she abducts the shoulder this causes achy discomfort in the anterior aspect of the shoulder. Forearm strength 5/5 with abduction and adduction. Extremities/Skin: Warm and dry.  No rashes or suspicious lesions. Neuro: Alert and oriented X 3. Moves all extremities spontaneously. Gait is normal. CNII-XII grossly in tact. Psych:  Responds to questions appropriately with a normal affect.     ASSESSMENT AND PLAN:  50 y.o. year old female with  1. Essential hypertension, benign Will have her resume her prior blood pressure pill HCTZ 25 mg daily. Will have her come back for follow-up in 2 weeks to recheck blood pressure and bmet on medication. - hydrochlorothiazide (HYDRODIURIL) 25 MG tablet; Take 1 tablet (25 mg total) by mouth daily.  Dispense: 30 tablet; Refill: 0  2. Gastroesophageal reflux disease without esophagitis - pantoprazole (PROTONIX) 40 MG tablet; Take 1 tablet (40 mg total) by mouth daily.  Dispense: 30 tablet; Refill: 0  3. Right shoulder pain Today I gave and reviewed handout with some stretches and exercises to do for tendinitis of the shoulder. Do these twice daily until her follow-up appointment in 2 weeks and we can follow this up at that time.  4. Viral respiratory infection She is to use over-the-counter medications for symptom relief. If symptoms worsen significantly or persist greater than 1 week, then follow-up.  She is going to schedule follow-up visit with Dr. Jeanice Limurham in  2 weeks. She is scheduling this for early morning so that she can come fasting and do lab work while she is here for that appointment. Reviewed her last labs from June 2016. At that time A1c was 6.1 LDL was 134 and vitamin D level was 12. All these will need to be followed up as well as the above.   Signed, [id number] [place], [place], B[facility] 09/07/2015 10:00 AM

## 2015-09-20 ENCOUNTER — Ambulatory Visit (INDEPENDENT_AMBULATORY_CARE_PROVIDER_SITE_OTHER): Payer: Managed Care, Other (non HMO) | Admitting: Family Medicine

## 2015-09-20 ENCOUNTER — Encounter: Payer: Self-pay | Admitting: Family Medicine

## 2015-09-20 VITALS — BP 132/88 | HR 88 | Temp 98.1°F | Resp 18 | Ht 60.0 in | Wt 177.0 lb

## 2015-09-20 DIAGNOSIS — I10 Essential (primary) hypertension: Secondary | ICD-10-CM | POA: Diagnosis not present

## 2015-09-20 DIAGNOSIS — E781 Pure hyperglyceridemia: Secondary | ICD-10-CM

## 2015-09-20 DIAGNOSIS — R911 Solitary pulmonary nodule: Secondary | ICD-10-CM | POA: Diagnosis not present

## 2015-09-20 DIAGNOSIS — E669 Obesity, unspecified: Secondary | ICD-10-CM

## 2015-09-20 DIAGNOSIS — E559 Vitamin D deficiency, unspecified: Secondary | ICD-10-CM | POA: Diagnosis not present

## 2015-09-20 DIAGNOSIS — J209 Acute bronchitis, unspecified: Secondary | ICD-10-CM | POA: Diagnosis not present

## 2015-09-20 LAB — CBC WITH DIFFERENTIAL/PLATELET
Basophils Absolute: 49 cells/uL (ref 0–200)
Basophils Relative: 1 %
EOS PCT: 7 %
Eosinophils Absolute: 343 cells/uL (ref 15–500)
HCT: 39.1 % (ref 35.0–45.0)
Hemoglobin: 12.9 g/dL (ref 12.0–15.0)
Lymphocytes Relative: 48 %
Lymphs Abs: 2352 cells/uL (ref 850–3900)
MCH: 27 pg (ref 27.0–33.0)
MCHC: 33 g/dL (ref 32.0–36.0)
MCV: 82 fL (ref 80.0–100.0)
MPV: 10.8 fL (ref 7.5–12.5)
Monocytes Absolute: 441 cells/uL (ref 200–950)
Monocytes Relative: 9 %
NEUTROS ABS: 1715 {cells}/uL (ref 1500–7800)
Neutrophils Relative %: 35 %
PLATELETS: 266 10*3/uL (ref 140–400)
RBC: 4.77 MIL/uL (ref 3.80–5.10)
RDW: 12.8 % (ref 11.0–15.0)
WBC: 4.9 10*3/uL (ref 3.8–10.8)

## 2015-09-20 LAB — COMPREHENSIVE METABOLIC PANEL
ALT: 51 U/L — ABNORMAL HIGH (ref 6–29)
AST: 43 U/L — ABNORMAL HIGH (ref 10–35)
Albumin: 4.2 g/dL (ref 3.6–5.1)
Alkaline Phosphatase: 60 U/L (ref 33–130)
BUN: 20 mg/dL (ref 7–25)
CHLORIDE: 105 mmol/L (ref 98–110)
CO2: 26 mmol/L (ref 20–31)
CREATININE: 0.66 mg/dL (ref 0.50–1.05)
Calcium: 9.1 mg/dL (ref 8.6–10.4)
GLUCOSE: 98 mg/dL (ref 70–99)
Potassium: 3.8 mmol/L (ref 3.5–5.3)
Sodium: 140 mmol/L (ref 135–146)
TOTAL PROTEIN: 7.2 g/dL (ref 6.1–8.1)
Total Bilirubin: 0.4 mg/dL (ref 0.2–1.2)

## 2015-09-20 LAB — LIPID PANEL
Cholesterol: 183 mg/dL (ref 125–200)
HDL: 47 mg/dL (ref >=46)
LDL Cholesterol: 99 mg/dL (ref <130)
Total CHOL/HDL Ratio: 3.9 Ratio (ref <=5.0)
Triglycerides: 184 mg/dL — ABNORMAL HIGH (ref <150)
VLDL: 37 mg/dL — AB (ref <30)

## 2015-09-20 LAB — TSH: TSH: 3.86 m[IU]/L

## 2015-09-20 MED ORDER — PREDNISONE 20 MG PO TABS: 40.0000 mg | ORAL_TABLET | Freq: Every day | ORAL | 0 refills | Status: DC

## 2015-09-20 MED ORDER — AZITHROMYCIN 250 MG PO TABS: ORAL_TABLET | ORAL | 0 refills | Status: DC
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
## 2015-09-20 NOTE — Assessment & Plan Note (Signed)
Blood pressure improved however she has been taking some over-the-counter dictations therefore her diastolic is still little elevated I will I for her to be less than 80. Not make any changes to her medication recommended she not take the decongestant-containing medications over-the-counter. Also discussed her need for ongoing dietary changes with history of hypertriglyceridemia fatty liver in her obesity and her fasting labs will be checked today.

## 2015-09-20 NOTE — Patient Instructions (Addendum)
We will call with lab results CT chest to be done- morning  Take steroids, antibiotics, coricidan  ( over the counter) for cough , simply saline  Get flu shot at pharmacy  F/U 3 months

## 2015-09-21 LAB — VITAMIN D 25 HYDROXY (VIT D DEFICIENCY, FRACTURES): VIT D 25 HYDROXY: 14 ng/mL — AB (ref 30–100)

## 2015-09-28 ENCOUNTER — Other Ambulatory Visit: Payer: Self-pay | Admitting: *Deleted

## 2015-09-28 ENCOUNTER — Encounter: Payer: Self-pay | Admitting: *Deleted

## 2015-09-28 MED ORDER — VITAMIN D (ERGOCALCIFEROL) 1.25 MG (50000 UNIT) PO CAPS: 50000.0000 [IU] | ORAL_CAPSULE | ORAL | 1 refills | Status: DC

## 2015-10-02 ENCOUNTER — Ambulatory Visit (HOSPITAL_COMMUNITY): Payer: Managed Care, Other (non HMO)

## 2015-10-02 ENCOUNTER — Telehealth: Payer: Self-pay | Admitting: Family Medicine

## 2015-10-02 ENCOUNTER — Ambulatory Visit (HOSPITAL_COMMUNITY)
Admission: RE | Admit: 2015-10-02 | Discharge: 2015-10-02 | Disposition: A | Discharge status: Home / Self Care | Payer: Managed Care, Other (non HMO) | Source: Ambulatory Visit | Attending: Family Medicine | Admitting: Family Medicine

## 2015-10-02 DIAGNOSIS — R911 Solitary pulmonary nodule: Secondary | ICD-10-CM | POA: Diagnosis present

## 2015-10-02 NOTE — Telephone Encounter (Signed)
Patient is calling states she is already taking Vitamin D. She is supposed to start taken Calcium as well however all the calcium she sees also has Vitamin D in it. She wants to make sure that is okay with the vitamin D she is already taken.  CB# [phone] She states if you can't reach her you can always send her a mychart message.

## 2015-10-03 ENCOUNTER — Encounter: Payer: Self-pay | Admitting: *Deleted

## 2015-10-03 NOTE — Telephone Encounter (Signed)
Mychart message sent.

## 2015-10-11 ENCOUNTER — Other Ambulatory Visit: Payer: Self-pay | Admitting: *Deleted

## 2015-10-11 DIAGNOSIS — I10 Essential (primary) hypertension: Secondary | ICD-10-CM

## 2015-10-11 MED ORDER — LANSOPRAZOLE 30 MG PO CPDR: 30.0000 mg | DELAYED_RELEASE_CAPSULE | Freq: Every day | ORAL | 1 refills | Status: DC

## 2015-10-11 MED ORDER — HYDROCHLOROTHIAZIDE 25 MG PO TABS: 25.0000 mg | ORAL_TABLET | Freq: Every day | ORAL | 0 refills | Status: DC

## 2015-10-11 MED ORDER — HYDROCHLOROTHIAZIDE 25 MG PO TABS: 25.0000 mg | ORAL_TABLET | Freq: Every day | ORAL | 1 refills | Status: DC

## 2015-10-11 NOTE — Telephone Encounter (Signed)
Received call from patient.   Reports that she requires refills on HCTZ and Prevacid. Requested 30 day supply to be sent to [facility], and 90 day supply to be sent to [facility].   Prescription sent to both pharmacies.

## 2015-11-24 ENCOUNTER — Other Ambulatory Visit: Payer: Self-pay | Admitting: Family Medicine

## 2015-11-24 DIAGNOSIS — I10 Essential (primary) hypertension: Secondary | ICD-10-CM

## 2015-11-24 MED ORDER — HYDROCHLOROTHIAZIDE 25 MG PO TABS: 25.0000 mg | ORAL_TABLET | Freq: Every day | ORAL | 1 refills | Status: DC

## 2015-11-24 MED ORDER — LANSOPRAZOLE 30 MG PO CPDR: 30.0000 mg | DELAYED_RELEASE_CAPSULE | Freq: Every day | ORAL | 1 refills | Status: DC

## 2016-02-07 ENCOUNTER — Ambulatory Visit (INDEPENDENT_AMBULATORY_CARE_PROVIDER_SITE_OTHER): Payer: Managed Care, Other (non HMO) | Admitting: Family Medicine

## 2016-02-07 ENCOUNTER — Other Ambulatory Visit: Payer: Self-pay | Admitting: Family Medicine

## 2016-02-07 ENCOUNTER — Encounter: Payer: Self-pay | Admitting: Family Medicine

## 2016-02-07 VITALS — BP 128/62 | HR 90 | Temp 99.0°F | Resp 14 | Ht 60.0 in | Wt 174.0 lb

## 2016-02-07 DIAGNOSIS — M62838 Other muscle spasm: Secondary | ICD-10-CM

## 2016-02-07 DIAGNOSIS — Z0000 Encounter for general adult medical examination without abnormal findings: Secondary | ICD-10-CM

## 2016-02-07 DIAGNOSIS — Z9109 Other allergy status, other than to drugs and biological substances: Secondary | ICD-10-CM | POA: Diagnosis not present

## 2016-02-07 DIAGNOSIS — I10 Essential (primary) hypertension: Secondary | ICD-10-CM

## 2016-02-07 DIAGNOSIS — Z1211 Encounter for screening for malignant neoplasm of colon: Secondary | ICD-10-CM | POA: Diagnosis not present

## 2016-02-07 DIAGNOSIS — K219 Gastro-esophageal reflux disease without esophagitis: Secondary | ICD-10-CM

## 2016-02-07 DIAGNOSIS — E669 Obesity, unspecified: Secondary | ICD-10-CM

## 2016-02-07 DIAGNOSIS — K760 Fatty (change of) liver, not elsewhere classified: Secondary | ICD-10-CM | POA: Diagnosis not present

## 2016-02-07 DIAGNOSIS — E559 Vitamin D deficiency, unspecified: Secondary | ICD-10-CM

## 2016-02-07 DIAGNOSIS — Z1231 Encounter for screening mammogram for malignant neoplasm of breast: Secondary | ICD-10-CM | POA: Diagnosis not present

## 2016-02-07 DIAGNOSIS — K581 Irritable bowel syndrome with constipation: Secondary | ICD-10-CM

## 2016-02-07 DIAGNOSIS — Z1239 Encounter for other screening for malignant neoplasm of breast: Secondary | ICD-10-CM

## 2016-02-07 DIAGNOSIS — E781 Pure hyperglyceridemia: Secondary | ICD-10-CM | POA: Diagnosis not present

## 2016-02-07 LAB — CBC WITH DIFFERENTIAL/PLATELET
BASOS ABS: 0 {cells}/uL (ref 0–200)
Basophils Relative: 0 %
EOS PCT: 6 %
Eosinophils Absolute: 270 cells/uL (ref 15–500)
HCT: 41.5 % (ref 35.0–45.0)
HEMOGLOBIN: 14 g/dL (ref 12.0–15.0)
LYMPHS ABS: 1890 {cells}/uL (ref 850–3900)
LYMPHS PCT: 42 %
MCH: 27.7 pg (ref 27.0–33.0)
MCHC: 33.7 g/dL (ref 32.0–36.0)
MCV: 82 fL (ref 80.0–100.0)
MONOS PCT: 11 %
MPV: 10.3 fL (ref 7.5–12.5)
Monocytes Absolute: 495 cells/uL (ref 200–950)
NEUTROS PCT: 41 %
Neutro Abs: 1845 cells/uL (ref 1500–7800)
Platelets: 303 10*3/uL (ref 140–400)
RBC: 5.06 MIL/uL (ref 3.80–5.10)
RDW: 13.2 % (ref 11.0–15.0)
WBC: 4.5 10*3/uL (ref 3.8–10.8)

## 2016-02-07 LAB — COMPREHENSIVE METABOLIC PANEL
ALBUMIN: 4.3 g/dL (ref 3.6–5.1)
ALT: 34 U/L — ABNORMAL HIGH (ref 6–29)
AST: 39 U/L — ABNORMAL HIGH (ref 10–35)
Alkaline Phosphatase: 61 U/L (ref 33–130)
BUN: 21 mg/dL (ref 7–25)
CHLORIDE: 101 mmol/L (ref 98–110)
CO2: 29 mmol/L (ref 20–31)
CREATININE: 0.78 mg/dL (ref 0.50–1.05)
Calcium: 9.9 mg/dL (ref 8.6–10.4)
GLUCOSE: 121 mg/dL — AB (ref 70–99)
Potassium: 3.8 mmol/L (ref 3.5–5.3)
SODIUM: 141 mmol/L (ref 135–146)
Total Bilirubin: 0.3 mg/dL (ref 0.2–1.2)
Total Protein: 7.8 g/dL (ref 6.1–8.1)

## 2016-02-07 LAB — LIPID PANEL
Cholesterol: 194 mg/dL (ref <200)
HDL: 39 mg/dL — AB (ref >50)
LDL Cholesterol: 92 mg/dL (ref <100)
Total CHOL/HDL Ratio: 5 Ratio — ABNORMAL HIGH (ref <5.0)
Triglycerides: 313 mg/dL — ABNORMAL HIGH (ref <150)
VLDL: 63 mg/dL — AB (ref <30)

## 2016-02-07 LAB — TSH: TSH: 1.92 m[IU]/L

## 2016-02-07 LAB — STREP GROUP A AG, W/REFLEX TO CULT: STREGTOCOCCUS GROUP A AG SCREEN: NOT DETECTED

## 2016-02-07 MED ORDER — RESTORA PO CAPS: ORAL_CAPSULE | ORAL | 11 refills | Status: DC

## 2016-02-07 MED ORDER — ESOMEPRAZOLE MAGNESIUM 40 MG PO CPDR: 40.0000 mg | DELAYED_RELEASE_CAPSULE | Freq: Every day | ORAL | 3 refills | Status: DC

## 2016-02-07 MED ORDER — CYCLOBENZAPRINE HCL 10 MG PO TABS: 10.0000 mg | ORAL_TABLET | Freq: Three times a day (TID) | ORAL | 1 refills | Status: DC | PRN

## 2016-02-07 NOTE — Assessment & Plan Note (Signed)
Given restora samples and prescription  Due for colonoscopy

## 2016-02-07 NOTE — Assessment & Plan Note (Signed)
Continue vitamin D, then start 2000IU once a day

## 2016-02-07 NOTE — Patient Instructions (Addendum)
Restora prescription  Mammogram please call and schedule Try the new acid medication Labs to be done  Muscle relaxer given , okay to use muscle rub Once you complete the prescription vitamin D- start Vitamin D 2000IU once a day  F/U 6 months

## 2016-02-07 NOTE — Assessment & Plan Note (Signed)
Well controlled 

## 2016-02-07 NOTE — Assessment & Plan Note (Signed)
Check LFT and Lipids working on diet

## 2016-02-07 NOTE — Assessment & Plan Note (Signed)
Trial of nexium given samples of 40mg 

## 2016-02-07 NOTE — Progress Notes (Signed)
Subjective:    Patient ID: Adriana Ramsey, female    DOB: 12/01/1965, 51 y.o.   MRN: [id number]  Patient presents for CPE (is fasting)  Pt  here for complete physical exam, occasions and history reviewed She is due for colonoscopy Flu shot due Mammogram over due  S/p hysterectomy benign reasons  Vitamin D def, taking vitamin D supplement   HTN- taking medication as prescribed, has fatty liver as well, due for lipids, LFT  TG last 184   Weight down 3lbs since visit 4 months ago   Neck pain- feels painful, sore, occasional pops for past 2 weeks, sits at desk typing all day, also with sleep feels knots and spasm ,Took tylenol   Has some congestion, sore throat past 2 weeks, no fever, no pain with swallowing, has more aching in throat, no known sick contacts, no couh , has also had allergy symptoms, not on allergy meds right now   GERD- taking Prevacid, but still has some gassiness, still has belching , tried protonix and this makes her sick on stomach and gives her headache   IBS-D- would like to restart Restora given by GI in the past, also takes occasional OTC laxative    Review Of Systems: per above   GEN- denies fatigue, fever, weight loss,weakness, recent illness HEENT- denies eye drainage, change in vision, nasal discharge, CVS- denies chest pain, palpitations RESP- denies SOB, cough, wheeze ABD- denies N/V, change in stools, +abd pain GU- denies dysuria, hematuria, dribbling, incontinence MSK- denies joint pain, muscle aches, injury Neuro- denies headache, dizziness, syncope, seizure activity       Objective:    BP 128/62 (BP Location: Left Arm, Patient Position: Sitting, Cuff Size: Normal)   Pulse 90   Temp 99 F (37.2 C) (Oral)   Resp 14   Ht 5' (1.524 m)   Wt 174 lb (78.9 kg)   SpO2 98%   BMI 33.98 kg/m  GEN- NAD, alert and oriented x3 HEENT- PERRL, EOMI, non injected sclera, pink conjunctiva, MMM, oropharynx mild erythema post oropharynx, no exudates,  nares nasal congestion, +clear rhinorrhea, TM clear bilat  Neck- Supple, no thyromegaly, no bruits, +spasnm trapeizus mild swelling right side onto top of shoulder, C spine NT, good ROM  CVS- RRR, no murmur RESP-CTAB ABD-NABS,soft,NT,ND EXT- No edema Pulses- Radial, DP- 2+   Strep Neg       Assessment & Plan:      Problem List Items Addressed This Visit    Vitamin D deficiency    Continue vitamin D, then start 2000IU once a day       Relevant Orders   Vitamin D, 25-hydroxy   Obesity (BMI 30-39.9)   IBS (irritable bowel syndrome)    Given restora samples and prescription  Due for colonoscopy       Relevant Medications   Probiotic Product (RESTORA) CAPS   esomeprazole (NEXIUM) 40 MG capsule   Hypertriglyceridemia   Relevant Orders   Lipid panel   GERD (gastroesophageal reflux disease)    Trial of nexium given samples of 40mg       Relevant Medications   Probiotic Product (RESTORA) CAPS   esomeprazole (NEXIUM) 40 MG capsule   Other Relevant Orders   Ambulatory referral to Gastroenterology   Fatty liver    Check LFT and Lipids working on diet       Relevant Orders   Ambulatory referral to Gastroenterology   Lipid panel   Essential hypertension, benign    Well controlled  Other Visit Diagnoses    Routine general medical examination at a health care facility    -  Primary   Pt to schedule mammogranm, referral for colonoscopy, declines flu shot, encourage exercise, healthy eating, for allergies zyxal sample given    Relevant Orders   CBC with Differential/Platelet   Comprehensive metabolic panel   TSH   Colon cancer screening       Relevant Orders   Ambulatory referral to Gastroenterology   Environmental allergies       Sore throat from post nasal drip with allergies, given xzyal, can use nasal rinse   Relevant Orders   STREP GROUP A AG, W/REFLEX TO CULT (Completed)   Neck muscle spasm       flexeril given       Note: This dictation was prepared  with Dragon dictation along with smaller phrase technology. Any transcriptional errors that result from this process are unintentional.

## 2016-02-08 ENCOUNTER — Other Ambulatory Visit: Payer: Self-pay | Admitting: Family Medicine

## 2016-02-08 ENCOUNTER — Encounter: Payer: Self-pay | Admitting: Gastroenterology

## 2016-02-08 DIAGNOSIS — R928 Other abnormal and inconclusive findings on diagnostic imaging of breast: Secondary | ICD-10-CM

## 2016-02-08 LAB — HEMOGLOBIN A1C
HEMOGLOBIN A1C: 5.8 % — AB (ref <5.7)
Mean Plasma Glucose: 120 mg/dL

## 2016-02-08 LAB — VITAMIN D 25 HYDROXY (VIT D DEFICIENCY, FRACTURES): Vit D, 25-Hydroxy: 40 ng/mL (ref 30–100)

## 2016-02-09 ENCOUNTER — Encounter: Payer: Self-pay | Admitting: Internal Medicine

## 2016-02-10 LAB — CULTURE, GROUP A STREP

## 2016-02-13 ENCOUNTER — Telehealth: Payer: Self-pay | Admitting: Family Medicine

## 2016-02-13 DIAGNOSIS — E781 Pure hyperglyceridemia: Secondary | ICD-10-CM

## 2016-02-13 NOTE — Telephone Encounter (Signed)
-----   Message from Salley ScarletKawanta F Dixie, MD sent at 02/11/2016  9:45 PM EST ----- Call pt her labs show her TG are very high, this goes with the fatty liver Her LFT are still a little high but improved Her fasting glucose was high, A1C however did return normal.  Recommend she take Lovaza 1 capsule twice a day - prescription for her cholesterol Vitamin D is normal, so when she finishes her q weekly, I want her to take 2000IU daily to keep her levels up.

## 2016-02-13 NOTE — Telephone Encounter (Signed)
Left mess at work phone, no answer on cell phone.

## 2016-02-13 NOTE — Telephone Encounter (Signed)
[facility] and wanted to speak with you. She said some of the results were elevated. Please try to call work number first if unavailable then call [phone].

## 2016-02-14 MED ORDER — OMEGA-3-ACID ETHYL ESTERS 1 G PO CAPS: 1.0000 g | ORAL_CAPSULE | Freq: Two times a day (BID) | ORAL | 1 refills | Status: DC

## 2016-02-14 NOTE — Telephone Encounter (Signed)
Pt aware of lab results.  Rx Lovaza to pharmacy.  Given mammogram appt also.

## 2016-02-16 ENCOUNTER — Other Ambulatory Visit: Payer: Self-pay | Admitting: *Deleted

## 2016-02-16 DIAGNOSIS — E781 Pure hyperglyceridemia: Secondary | ICD-10-CM

## 2016-02-16 MED ORDER — CYCLOBENZAPRINE HCL 10 MG PO TABS: 10.0000 mg | ORAL_TABLET | Freq: Three times a day (TID) | ORAL | 1 refills | Status: DC | PRN

## 2016-02-16 MED ORDER — OMEGA-3-ACID ETHYL ESTERS 1 G PO CAPS: 1.0000 g | ORAL_CAPSULE | Freq: Two times a day (BID) | ORAL | 1 refills | Status: DC

## 2016-02-20 ENCOUNTER — Ambulatory Visit (HOSPITAL_COMMUNITY)
Admission: RE | Admit: 2016-02-20 | Discharge: 2016-02-20 | Disposition: A | Discharge status: Home / Self Care | Payer: Managed Care, Other (non HMO) | Source: Ambulatory Visit | Attending: Family Medicine | Admitting: Family Medicine

## 2016-02-20 ENCOUNTER — Other Ambulatory Visit: Payer: Self-pay | Admitting: *Deleted

## 2016-02-20 DIAGNOSIS — I10 Essential (primary) hypertension: Secondary | ICD-10-CM

## 2016-02-20 DIAGNOSIS — R928 Other abnormal and inconclusive findings on diagnostic imaging of breast: Secondary | ICD-10-CM | POA: Diagnosis not present

## 2016-02-20 DIAGNOSIS — N6489 Other specified disorders of breast: Secondary | ICD-10-CM | POA: Insufficient documentation

## 2016-02-20 MED ORDER — HYDROCHLOROTHIAZIDE 25 MG PO TABS: 25.0000 mg | ORAL_TABLET | Freq: Every day | ORAL | 1 refills | Status: DC

## 2016-02-28 ENCOUNTER — Other Ambulatory Visit: Payer: Self-pay | Admitting: *Deleted

## 2016-02-28 DIAGNOSIS — E781 Pure hyperglyceridemia: Secondary | ICD-10-CM

## 2016-02-28 MED ORDER — OMEGA-3-ACID ETHYL ESTERS 1 G PO CAPS: 1.0000 g | ORAL_CAPSULE | Freq: Two times a day (BID) | ORAL | 3 refills | Status: DC

## 2016-02-29 ENCOUNTER — Ambulatory Visit (INDEPENDENT_AMBULATORY_CARE_PROVIDER_SITE_OTHER): Payer: Managed Care, Other (non HMO) | Admitting: Gastroenterology

## 2016-02-29 ENCOUNTER — Other Ambulatory Visit: Payer: Self-pay

## 2016-02-29 ENCOUNTER — Encounter: Payer: Self-pay | Admitting: Gastroenterology

## 2016-02-29 VITALS — BP 142/89 | HR 88 | Temp 98.0°F | Ht 60.0 in | Wt 175.4 lb

## 2016-02-29 DIAGNOSIS — K581 Irritable bowel syndrome with constipation: Secondary | ICD-10-CM

## 2016-02-29 DIAGNOSIS — Z1211 Encounter for screening for malignant neoplasm of colon: Secondary | ICD-10-CM | POA: Diagnosis not present

## 2016-02-29 DIAGNOSIS — K760 Fatty (change of) liver, not elsewhere classified: Secondary | ICD-10-CM

## 2016-02-29 MED ORDER — NA SULFATE-K SULFATE-MG SULF 17.5-3.13-1.6 GM/177ML PO SOLN: 1.0000 | ORAL | 0 refills | Status: DC

## 2016-02-29 NOTE — Assessment & Plan Note (Signed)
First ever screening colonoscopy in the near future.  I have discussed the risks, alternatives, benefits with regards to but not limited to the risk of reaction to medication, bleeding, infection, perforation and the patient is agreeable to proceed. Written consent to be obtained.  

## 2016-02-29 NOTE — Progress Notes (Signed)
Please NIC for ov in six months for fatty liver, constipation with SLF.

## 2016-02-29 NOTE — Assessment & Plan Note (Signed)
Continue monitoring LFTs every 6 months.   Instructions for fatty liver: Recommend 1-2# weight loss per week until ideal body weight through exercise & diet. Low fat/cholesterol diet.   Avoid sweets, sodas, fruit juices, sweetened beverages like tea, etc. Gradually increase exercise from 15 min daily up to 1 hr per day 5 days/week. Limit alcohol use.

## 2016-02-29 NOTE — Assessment & Plan Note (Signed)
Trial of Linzess daily on empty stomach. Samples provided. Call for RX if wants to continue.

## 2016-02-29 NOTE — Patient Instructions (Addendum)
1. Colonoscopy as scheduled. See separate instructions.  2. Trial of Linzess 72mcg take one on empty stomach daily for constipation. Samples provided. Call for RX or dose change if needed.   Instructions for fatty liver: Recommend 1-2# weight loss per week until ideal body weight through exercise & diet. Low fat/cholesterol diet.   Avoid sweets, sodas, fruit juices, sweetened beverages like tea, etc. Gradually increase exercise from 15 min daily up to 1 hr per day 5 days/week. Limit alcohol use.    Fatty Liver Introduction Fatty liver, also called hepatic steatosis or steatohepatitis, is a condition in which too much fat has built up in your liver cells. The liver removes harmful substances from your bloodstream. It produces fluids your body needs. It also helps your body use and store energy from the food you eat. In many cases, fatty liver does not cause symptoms or problems. It is often diagnosed when tests are being done for other reasons. However, over time, fatty liver can cause inflammation that may lead to more serious liver problems, such as scarring of the liver (cirrhosis). What are the causes? Causes of fatty liver may include:  Drinking too much alcohol.  Poor nutrition.  Obesity.  Cushing syndrome.  Diabetes.  Hyperlipidemia.  Pregnancy.  Certain drugs.  Poisons.  Some viral infections. What increases the risk? You may be more likely to develop fatty liver if you:  Abuse alcohol.  Are pregnant.  Are overweight.  Have diabetes.  Have hepatitis.  Have a high triglyceride level. What are the signs or symptoms? Fatty liver often does not cause any symptoms. In cases where symptoms develop, they can include:  Fatigue.  Weakness.  Weight loss.  Confusion.  Abdominal pain.  Yellowing of your skin and the white parts of your eyes (jaundice).  Nausea and vomiting. How is this diagnosed? Fatty liver may be diagnosed by:  Physical exam and  medical history.  Blood tests.  Imaging tests, such as an ultrasound, CT scan, or MRI.  Liver biopsy. A small sample of liver tissue is removed using a needle. The sample is then looked at under a microscope. How is this treated? Fatty liver is often caused by other health conditions. Treatment for fatty liver may involve medicines and lifestyle changes to manage conditions such as:  Alcoholism.  High cholesterol.  Diabetes.  Being overweight or obese. Follow these instructions at home:  Eat a healthy diet as directed by your health care provider.  Exercise regularly. This can help you lose weight and control your cholesterol and diabetes. Talk to your health care provider about an exercise plan and which activities are best for you.  Do not drink alcohol.  Take medicines only as directed by your health care provider. Contact a health care provider if: You have difficulty controlling your:  Blood sugar.  Cholesterol.  Alcohol consumption. Get help right away if:  You have abdominal pain.  You have jaundice.  You have nausea and vomiting. This information is not intended to replace advice given to you by your health care provider. Make sure you discuss any questions you have with your health care provider. Document Released: 02/08/2005 Document Revised: 06/01/2015 Document Reviewed: 05/05/2013  2017 Elsevier

## 2016-02-29 NOTE — Progress Notes (Addendum)
REVIEWED-NO ADDITIONAL RECOMMENDATIONS.  Primary Care Physician: Milinda Antis, MD  Primary Gastroenterologist:  Jonette Eva, MD   Chief Complaint  Patient presents with  . Fatty Liver    HPI: Adriana Ramsey is a 50 y.o. female here at the request of PCP for fatty liver and colonoscopy. Patient has a history of minimally elevated AST/ALT, fatty liver on ultrasound in 2015. Previous viral markers negative for hepatitis B and C. She previously has postpone colonoscopy.  Recently had Prevacid switched to Nexium because of breakthrough heartburn. Symptoms seem to be improved. Denies dysphagia, vomiting, abdominal pain. She has a history chronic constipation, previously did well on Restora. Started back on some samples recently provided by PCP. Somewhat improved with bowel regularity, mostly still has to strain. No diarrhea. No melena, rectal bleeding. Mild abdominal pain improved with BM. Has eliminated fatty foods for her diet. Trying to increase physical activity. No recent weight changes.    Current Outpatient Prescriptions  Medication Sig Dispense Refill  . cyclobenzaprine (FLEXERIL) 10 MG tablet Take 1 tablet (10 mg total) by mouth 3 (three) times daily as needed for muscle spasms. 30 tablet 1  . EPINEPHrine (EPI-PEN) 0.3 mg/0.3 mL DEVI Inject 0.3 mLs (0.3 mg total) into the muscle once. 2 Device 1  . esomeprazole (NEXIUM) 40 MG capsule Take 1 capsule (40 mg total) by mouth daily. 30 capsule 3  . hydrochlorothiazide (HYDRODIURIL) 25 MG tablet Take 1 tablet (25 mg total) by mouth daily. 90 tablet 1  . lansoprazole (PREVACID) 30 MG capsule Take 1 capsule (30 mg total) by mouth daily at 12 noon. 90 capsule 1  . omega-3 acid ethyl esters (LOVAZA) 1 g capsule Take 1 capsule (1 g total) by mouth 2 (two) times daily. 180 capsule 3  . Probiotic Product (RESTORA) CAPS Take 1 capsule daily 30 capsule 11  . Vitamin D, Ergocalciferol, (DRISDOL) 50000 units CAPS capsule Take 1 capsule  (50,000 Units total) by mouth every 7 (seven) days. x6 months 12 capsule 1   No current facility-administered medications for this visit.     Allergies as of 02/29/2016 - Review Complete 02/29/2016  Allergen Reaction Noted  . Sulfur Swelling 07/17/2011  . Ibuprofen  09/22/2013  . Penicillins  05/01/2012   Past Medical History:  Diagnosis Date  . Allergy   . Gallbladder polyp 2012   Last ultrasound 2015, no evidence of gallbladder polyp  . GERD (gastroesophageal reflux disease)   . Hypertension   . PUD (peptic ulcer disease) 2011   Past Surgical History:  Procedure Laterality Date  . ABDOMINAL HYSTERECTOMY     partial   . BREAST REDUCTION SURGERY  2007  . CESAREAN SECTION     2  . ESOPHAGOGASTRODUODENOSCOPY  11/23/2009   NUR: hypopharyngeal erosions of unclear sidnificance/mild changes of reflux /small sliding hiatal hernia and non Schatzki ring/3-mm gastric ulcer along the antrum with 2 scars and few erosions. h.pyori serologies negative   Family History  Problem Relation Age of Onset  . Hyperlipidemia Mother   . Seizures Mother    Social History   Social History  . Marital status: Married    Spouse name: N/A  . Number of children: 2  . Years of education: N/A   Occupational History  . billing specialist [employer]   Social History Main Topics  . Smoking status: Never Smoker  . Smokeless tobacco: Never Used  . Alcohol use Yes     Comment: wine 1 glass per week  .  Drug use: No  . Sexual activity: Yes    Partners: Male   Other Topics Concern  . None   Social History Narrative   Lives w/ husband & 1 son, 1 grown daughter    ROS:  General: Negative for anorexia, weight loss, fever, chills, fatigue, weakness. ENT: Negative for hoarseness, difficulty swallowing , nasal congestion. CV: Negative for chest pain, angina, palpitations, dyspnea on exertion, peripheral edema.  Respiratory: Negative for dyspnea at rest, dyspnea on exertion, cough,  sputum, wheezing.  GI: See history of present illness. GU:  Negative for dysuria, hematuria, urinary incontinence, urinary frequency, nocturnal urination.  Endo: Negative for unusual weight change.    Physical Examination:   BP (!) 142/89   Pulse 88   Temp 98 F (36.7 C) (Oral)   Ht 5' (1.524 m)   Wt 175 lb 6.4 oz (79.6 kg)   BMI 34.26 kg/m   General: Well-nourished, well-developed in no acute distress.  Eyes: No icterus. Mouth: Oropharyngeal mucosa moist and pink , no lesions erythema or exudate. Lungs: Clear to auscultation bilaterally.  Heart: Regular rate and rhythm, no murmurs rubs or gallops.  Abdomen: Bowel sounds are normal, nontender, nondistended, no hepatosplenomegaly or masses, no abdominal bruits or hernia , no rebound or guarding.   Extremities: No lower extremity edema. No clubbing or deformities. Neuro: Alert and oriented x 4   Skin: Warm and dry, no jaundice.   Psych: Alert and cooperative, normal mood and affect.  Labs:  Lab Results  Component Value Date   ALT 34 (H) 02/07/2016   AST 39 (H) 02/07/2016   ALKPHOS 61 02/07/2016   BILITOT 0.3 02/07/2016   Lab Results  Component Value Date   CREATININE 0.78 02/07/2016   BUN 21 02/07/2016   NA 141 02/07/2016   K 3.8 02/07/2016   CL 101 02/07/2016   CO2 29 02/07/2016   Lab Results  Component Value Date   WBC 4.5 02/07/2016   HGB 14.0 02/07/2016   HCT 41.5 02/07/2016   MCV 82.0 02/07/2016   PLT 303 02/07/2016   Lab Results  Component Value Date   TSH 1.92 02/07/2016    Imaging Studies: Mm Diag Breast Tomo Bilateral  Result Date: 02/20/2016 CLINICAL DATA:  Patient did not return for the recommended follow-up after evaluation in 2015 for asymmetry in the medial aspect of the right breast. History of bilateral breast reduction. EXAM: 2D DIGITAL DIAGNOSTIC BILATERAL MAMMOGRAM WITH CAD AND ADJUNCT TOMO COMPARISON:  12/06/2013 and earlier ACR Breast Density Category c: The breast tissue is  heterogeneously dense, which may obscure small masses. FINDINGS: No suspicious mass, distortion, or microcalcifications are identified to suggest presence of malignancy. Focal asymmetry in the lower inner quadrant of the right breast appear stable. No associated distortion, convex margins, or suspicious microcalcifications are identified. Left breast is negative. Mammographic images were processed with CAD. IMPRESSION: Stable appearance of focal asymmetry in the right breast, consistent with benign breast parenchyma or possible scar related to prior breast reduction. No mammographic evidence for malignancy. RECOMMENDATION: Screening mammogram in one year.(Code:SM-B-01Y) I have discussed the findings and recommendations with the patient. Results were also provided in writing at the conclusion of the visit. If applicable, a reminder letter will be sent to the patient regarding the next appointment. BI-RADS CATEGORY  1: Negative. Electronically Signed   By: Norva PavlovElizabeth  Brown M.D.   On: 02/20/2016 14:36

## 2016-02-29 NOTE — Progress Notes (Signed)
cc'ed to pcp °

## 2016-03-04 NOTE — Progress Notes (Signed)
Reminder in epic °

## 2016-03-05 NOTE — Progress Notes (Signed)
She is on recall °

## 2016-03-12 ENCOUNTER — Other Ambulatory Visit: Payer: Self-pay | Admitting: *Deleted

## 2016-03-12 MED ORDER — ESOMEPRAZOLE MAGNESIUM 40 MG PO CPDR: 40.0000 mg | DELAYED_RELEASE_CAPSULE | Freq: Every day | ORAL | 3 refills | Status: DC

## 2016-03-14 ENCOUNTER — Ambulatory Visit (HOSPITAL_COMMUNITY)
Admission: RE | Admit: 2016-03-14 | Discharge: 2016-03-14 | Disposition: A | Discharge status: Home / Self Care | Payer: Managed Care, Other (non HMO) | Source: Ambulatory Visit | Attending: Gastroenterology | Admitting: Gastroenterology

## 2016-03-14 ENCOUNTER — Encounter (HOSPITAL_COMMUNITY)
Admission: RE | Disposition: A | Discharge status: Home / Self Care | Payer: Self-pay | Source: Ambulatory Visit | Attending: Gastroenterology

## 2016-03-14 ENCOUNTER — Encounter (HOSPITAL_COMMUNITY): Payer: Self-pay | Admitting: *Deleted

## 2016-03-14 DIAGNOSIS — Z8711 Personal history of peptic ulcer disease: Secondary | ICD-10-CM | POA: Diagnosis not present

## 2016-03-14 DIAGNOSIS — Z79899 Other long term (current) drug therapy: Secondary | ICD-10-CM | POA: Diagnosis not present

## 2016-03-14 DIAGNOSIS — K648 Other hemorrhoids: Secondary | ICD-10-CM | POA: Diagnosis not present

## 2016-03-14 DIAGNOSIS — I10 Essential (primary) hypertension: Secondary | ICD-10-CM | POA: Diagnosis not present

## 2016-03-14 DIAGNOSIS — K644 Residual hemorrhoidal skin tags: Secondary | ICD-10-CM | POA: Insufficient documentation

## 2016-03-14 DIAGNOSIS — Z1211 Encounter for screening for malignant neoplasm of colon: Secondary | ICD-10-CM | POA: Insufficient documentation

## 2016-03-14 DIAGNOSIS — K219 Gastro-esophageal reflux disease without esophagitis: Secondary | ICD-10-CM | POA: Diagnosis not present

## 2016-03-14 DIAGNOSIS — Z1212 Encounter for screening for malignant neoplasm of rectum: Secondary | ICD-10-CM

## 2016-03-14 DIAGNOSIS — K5730 Diverticulosis of large intestine without perforation or abscess without bleeding: Secondary | ICD-10-CM | POA: Diagnosis not present

## 2016-03-14 DIAGNOSIS — K562 Volvulus: Secondary | ICD-10-CM | POA: Insufficient documentation

## 2016-03-14 HISTORY — PX: COLONOSCOPY: SHX5424

## 2016-03-14 SURGERY — COLONOSCOPY
Anesthesia: Moderate Sedation

## 2016-03-14 MED ORDER — SIMETHICONE 40 MG/0.6ML PO SUSP
ORAL | Status: DC | PRN
  Administered 2016-03-14: 13:00:00

## 2016-03-14 MED ORDER — MIDAZOLAM HCL 5 MG/5ML IJ SOLN
INTRAMUSCULAR | Status: AC
  Filled 2016-03-14: qty 10

## 2016-03-14 MED ORDER — MEPERIDINE HCL 100 MG/ML IJ SOLN
INTRAMUSCULAR | Status: AC
  Filled 2016-03-14: qty 2

## 2016-03-14 MED ORDER — SODIUM CHLORIDE 0.9 % IV SOLN
INTRAVENOUS | Status: DC
  Administered 2016-03-14: 12:00:00 via INTRAVENOUS

## 2016-03-14 MED ORDER — MEPERIDINE HCL 100 MG/ML IJ SOLN
INTRAMUSCULAR | Status: DC | PRN
Start: 2016-03-14 — End: 2016-03-14
  Administered 2016-03-14 (×3): 25 mg via INTRAVENOUS

## 2016-03-14 MED ORDER — MIDAZOLAM HCL 5 MG/5ML IJ SOLN
INTRAMUSCULAR | Status: DC | PRN
  Administered 2016-03-14 (×3): 2 mg via INTRAVENOUS

## 2016-03-14 NOTE — Op Note (Signed)
[facility] Patient Name: Adriana CentralEllajaretta Ramsey Procedure Date: 03/14/2016 11:38 AM MRN: [id number] Date of Birth: 06/03/1965 Attending MD: Jonette EvaSandi Bless Lisenby , MD CSN: [id number] Age: 51 Admit Type: Outpatient Procedure:                Colonoscopy, SCREENING Indications:              Screening for colorectal malignant neoplasm Providers:                Jonette EvaSandi Rosie Golson, MD, Jannett CelestineAnitra Bell, RN, Lollie Marrowaylor B. Lake,                            Pensions consultantTechnician Referring MD:             Velna HatchetKawanta F. [place]us Medicines:                Meperidine 75 mg IV, Midazolam 6 mg IV Complications:            No immediate complications. Estimated Blood Loss:     Estimated blood loss: none. Procedure:                Pre-Anesthesia Assessment:                           - Prior to the procedure, a History and Physical                            was performed, and patient medications and                            allergies were reviewed. The patient's tolerance of                            previous anesthesia was also reviewed. The risks                            and benefits of the procedure and the sedation                            options and risks were discussed with the patient.                            All questions were answered, and informed consent                            was obtained. Prior Anticoagulants: The patient has                            taken no previous anticoagulant or antiplatelet                            agents. ASA Grade Assessment: II - A patient with                            mild systemic disease. After reviewing the risks  and benefits, the patient was deemed in                            satisfactory condition to undergo the procedure.                            After obtaining informed consent, the colonoscope                            was passed under direct vision. Throughout the                            procedure, the patient's blood pressure, pulse, and                             oxygen saturations were monitored continuously. The                            EC-[id number] ([id number]) scope was introduced through                            the anus and advanced to the the cecum, identified                            by appendiceal orifice and ileocecal valve. The                            colonoscopy was technically difficult and complex                            due to significant looping. Successful completion                            of the procedure was aided by COLOWRAP. The patient                            tolerated the procedure fairly well. The quality of                            the bowel preparation was excellent. The ileocecal                            valve, appendiceal orifice, and rectum were                            photographed. Scope In: 1:05:20 PM Scope Out: 1:19:59 PM Scope Withdrawal Time: 0 hours 10 minutes 41 seconds  Total Procedure Duration: 0 hours 14 minutes 39 seconds  Findings:      A few small-mouthed diverticula were found in the sigmoid colon and       descending colon.      The recto-sigmoid colon and sigmoid colon were moderately redundant.      External and internal hemorrhoids were found during retroflexion. Impression:               -  MILD Diverticulosis in the sigmoid colon and in                            the descending colon.                           - Redundant RECTOSIGMOID COLON.                           - External and internal hemorrhoids. Moderate Sedation:      Moderate (conscious) sedation was administered by the endoscopy nurse       and supervised by the endoscopist. The following parameters were       monitored: oxygen saturation, heart rate, blood pressure, and response       to care. Total physician intraservice time was 28 minutes. Recommendation:           - High fiber diet.                           - Continue present medications.                           - Repeat  colonoscopy in 10 years for surveillance.                           - Patient has a contact number available for                            emergencies. The signs and symptoms of potential                            delayed complications were discussed with the                            patient. Return to normal activities tomorrow.                            Written discharge instructions were provided to the                            patient. Procedure Code(s):        --- Professional ---                           [phone], Colonoscopy, flexible; diagnostic, including                            collection of specimen(s) by brushing or washing,                            when performed (separate procedure)                           99152, Moderate sedation services provided by the  same physician or other qualified Adriana care                            professional performing the diagnostic or                            therapeutic service that the sedation supports,                            requiring the presence of an independent trained                            observer to assist in the monitoring of the                            patient's level of consciousness and physiological                            status; initial 15 minutes of intraservice time,                            patient age 43 years or older                           [phone], Moderate sedation services; each additional                            15 minutes intraservice time Diagnosis Code(s):        --- Professional ---                           Z12.11, Encounter for screening for malignant                            neoplasm of colon                           K64.8, Other hemorrhoids                           K57.30, Diverticulosis of large intestine without                            perforation or abscess without bleeding                           Q43.8, Other specified congenital  malformations of                            intestine CPT copyright 2016 American Medical Association. All rights reserved. The codes documented in this report are preliminary and upon coder review may  be revised to meet current compliance requirements. Jonette Eva, MD Jonette Eva, MD 03/14/2016 1:32:15 PM This report has been signed electronically. Number of Addenda: 0

## 2016-03-14 NOTE — Discharge Instructions (Signed)
You DID NOT HAVE ANY POLYPS. YOU HAVE DIVERTICULOSIS IN YOUR LEFT colon. You have internal hemorrhoids.   CONTINUE YOUR WEIGHT LOSS EFFORTS. YOU SHOULD LOSE 20 LBS.  WHILE I DO NOT WANT TO ALARM YOU, YOUR BODY MASS INDEX IS OVER 30 WHICH MEANS YOU ARE OBESE. THIS INCREASE YOUR RISK FOR CIRRHOSIS DUE TO FATTY LIVER DISEASE. OBESITY ACTIVATES CANCER GENES AND IS ASSOCIATED WITH AN INCREASE RISK FOR ALL CANCERS, INCLUDING ESOPHAGEAL AND COLON CANCER.   SEE INSTRUCTIONS BELOW TO MANAGE YOUR CONSTIPATION.  DRINK WATER TO KEEP URINE LIGHT YELLOW.  FOLLOW A HIGH FIBER DIET. AVOID ITEMS THAT CAUSE BLOATING & GAS. SEE INFO BELOW.  If you THINK YOU NEED A PRESCRIPTION MEDICINE FOR YOUR CONSTIPATION, PLEASE CALL THE OFFICE.  Next colonoscopy in 10 years.   Colonoscopy Care After Read the instructions outlined below and refer to this sheet in the next week. These discharge instructions provide you with general information on caring for yourself after you leave the hospital. While your treatment has been planned according to the most current medical practices available, unavoidable complications occasionally occur. If you have any problems or questions after discharge, call DR. Constance Hackenberg, [phone].  ACTIVITY  You may resume your regular activity, but move at a slower pace for the next 24 hours.   Take frequent rest periods for the next 24 hours.   Walking will help get rid of the air and reduce the bloated feeling in your belly (abdomen).   No driving for 24 hours (because of the medicine (anesthesia) used during the test).   You may shower.   Do not sign any important legal documents or operate any machinery for 24 hours (because of the anesthesia used during the test).    NUTRITION  Drink plenty of fluids.   You may resume your normal diet as instructed by your doctor.   Begin with a light meal and progress to your normal diet. Heavy or fried foods are harder to digest and may make  you feel sick to your stomach (nauseated).   Avoid alcoholic beverages for 24 hours or as instructed.    MEDICATIONS  You may resume your normal medications.   WHAT YOU CAN EXPECT TODAY  Some feelings of bloating in the abdomen.   Passage of more gas than usual.   Spotting of blood in your stool or on the toilet paper  .  IF YOU HAD POLYPS REMOVED DURING THE COLONOSCOPY:  Eat a soft diet IF YOU HAVE NAUSEA, BLOATING, ABDOMINAL PAIN, OR VOMITING.    FINDING OUT THE RESULTS OF YOUR TEST Not all test results are available during your visit. DR. Darrick PennaFIELDS WILL CALL YOU WITHIN 7 DAYS OF YOUR PROCEDUE WITH YOUR RESULTS. Do not assume everything is normal if you have not heard from DR. Brittin Janik IN ONE WEEK, CALL HER OFFICE AT [phone].  SEEK IMMEDIATE MEDICAL ATTENTION AND CALL THE OFFICE: [phone] IF:  You have more than a spotting of blood in your stool.   Your belly is swollen (abdominal distention).   You are nauseated or vomiting.   You have a temperature over 101F.   You have abdominal pain or discomfort that is severe or gets worse throughout the day.   Constipation in Adults Constipation is having fewer than 2 bowel movements per week. Usually, the stools are hard. As we grow older, constipation is more common. If you try to fix constipation with laxatives, the problem may get worse. This is because laxatives taken over a  long period of time make the colon muscles weaker. A low-fiber diet, not taking in enough fluids, and taking some medicines may make these problems worse.  HOME CARE INSTRUCTIONS  Constipation is usually best cared for without medicines. Increasing dietary fiber and eating more fruits and vegetables is the best way to manage constipation.   Slowly increase fiber intake to 25 to 38 grams per day. Whole grains, fruits, vegetables, and legumes are good sources of fiber. A dietitian can further help you incorporate high-fiber foods into your diet.    Drink enough water and fluids to keep your urine clear or pale yellow.   A fiber supplement may be added to your diet if you cannot get enough fiber from foods.   Increasing your activities also helps improve regularity.   Stronger measures, such as magnesium sulfate, should be avoided if possible. This may cause uncontrollable diarrhea. Using magnesium sulfate may not allow you time to make it to the bathroom.    High-Fiber Diet A high-fiber diet changes your normal diet to include more whole grains, legumes, fruits, and vegetables. Changes in the diet involve replacing refined carbohydrates with unrefined foods. The calorie level of the diet is essentially unchanged. The Dietary Reference Intake (recommended amount) for adult males is 38 grams per day. For adult females, it is 25 grams per day. Pregnant and lactating women should consume 28 grams of fiber per day.Fiber is the intact part of a plant that is not broken down during digestion. Functional fiber is fiber that has been isolated from the plant to provide a beneficial effect in the body.  PURPOSE  Increase stool bulk.   Ease and regulate bowel movements.   Lower cholesterol.   REDUCE RISK OF COLON CANCER  INDICATIONS THAT YOU NEED MORE FIBER  Constipation and hemorrhoids.   Uncomplicated diverticulosis (intestine condition) and irritable bowel syndrome.   Weight management.   As a protective measure against hardening of the arteries (atherosclerosis), diabetes, and cancer.   GUIDELINES FOR INCREASING FIBER IN THE DIET  Start adding fiber to the diet slowly. A gradual increase of about 5 more grams (2 slices of whole-wheat bread, 2 servings of most fruits or vegetables, or 1 bowl of high-fiber cereal) per day is best. Too rapid an increase in fiber may result in constipation, flatulence, and bloating.   Drink enough water and fluids to keep your urine clear or pale yellow. Water, juice, or caffeine-free drinks are  recommended. Not drinking enough fluid may cause constipation.   Eat a variety of high-fiber foods rather than one type of fiber.   Try to increase your intake of fiber through using high-fiber foods rather than fiber pills or supplements that contain small amounts of fiber.   The goal is to change the types of food eaten. Do not supplement your present diet with high-fiber foods, but replace foods in your present diet.   INCLUDE A VARIETY OF FIBER SOURCES  Replace refined and processed grains with whole grains, canned fruits with fresh fruits, and incorporate other fiber sources. White rice, white breads, and most bakery goods contain little or no fiber.   Brown whole-grain rice, buckwheat oats, and many fruits and vegetables are all good sources of fiber. These include: broccoli, Brussels sprouts, cabbage, cauliflower, beets, sweet potatoes, white potatoes (skin on), carrots, tomatoes, eggplant, squash, berries, fresh fruits, and dried fruits.   Cereals appear to be the richest source of fiber. Cereal fiber is found in whole grains and bran. Bran  is the fiber-rich outer coat of cereal grain, which is largely removed in refining. In whole-grain cereals, the bran remains. In breakfast cereals, the largest amount of fiber is found in those with "bran" in their names. The fiber content is sometimes indicated on the label.   You may need to include additional fruits and vegetables each day.   In baking, for 1 cup white flour, you may use the following substitutions:   1 cup whole-wheat flour minus 2 tablespoons.   1/2 cup white flour plus 1/2 cup whole-wheat flour.   Diverticulosis Diverticulosis is a common condition that develops when small pouches (diverticula) form in the wall of the colon. The risk of diverticulosis increases with age. It happens more often in people who eat a low-fiber diet. Most individuals with diverticulosis have no symptoms. Those individuals with symptoms usually  experience belly (abdominal) pain, constipation, or loose stools (diarrhea).  HOME CARE INSTRUCTIONS  Increase the amount of fiber in your diet as directed by your caregiver or dietician. This may reduce symptoms of diverticulosis.   Drink at least 6 to 8 glasses of water each day to prevent constipation.   Try not to strain when you have a bowel movement.   THERE IS NO NEED TO Avoid nuts and seeds to prevent complications.   FOODS HAVING HIGH FIBER CONTENT INCLUDE:  Fruits. Apple, peach, pear, tangerine, raisins, prunes.   Vegetables. Brussels sprouts, asparagus, broccoli, cabbage, carrot, cauliflower, romaine lettuce, spinach, summer squash, tomato, winter squash, zucchini.   Starchy Vegetables. Baked beans, kidney beans, lima beans, split peas, lentils, potatoes (with skin).   Grains. Whole wheat bread, brown rice, bran flake cereal, plain oatmeal, white rice, shredded wheat, bran muffins.    Hemorrhoids Hemorrhoids are dilated (enlarged) veins around the rectum. Sometimes clots will form in the veins. This makes them swollen and painful. These are called thrombosed hemorrhoids. Causes of hemorrhoids include:  Constipation.   Straining to have a bowel movement.   HEAVY LIFTING  HOME CARE INSTRUCTIONS  Eat a well balanced diet and drink 6 to 8 glasses of water every day to avoid constipation. You may also use a bulk laxative.   Avoid straining to have bowel movements.   Keep anal area dry and clean.   Do not use a donut shaped pillow or sit on the toilet for long periods. This increases blood pooling and pain.   Move your bowels when your body has the urge; this will require less straining and will decrease pain and pressure.

## 2016-03-14 NOTE — H&P (Signed)
Primary Care Physician:  Vic Blackbird, MD Primary Gastroenterologist:  Dr. Oneida Alar  Pre-Procedure History & Physical: HPI:  Adriana Ramsey is a 51 y.o. female here for COLON CANCER SCREENING.  Past Medical History:  Diagnosis Date  . Allergy   . Gallbladder polyp 2012   Last ultrasound 2015, no evidence of gallbladder polyp  . GERD (gastroesophageal reflux disease)   . Hypertension   . PUD (peptic ulcer disease) 2011    Past Surgical History:  Procedure Laterality Date  . ABDOMINAL HYSTERECTOMY     partial   . BREAST REDUCTION SURGERY  2007  . CESAREAN SECTION     2  . ESOPHAGOGASTRODUODENOSCOPY  11/23/2009   NUR: hypopharyngeal erosions of unclear sidnificance/mild changes of reflux /small sliding hiatal hernia and non Schatzki ring/3-mm gastric ulcer along the antrum with 2 scars and few erosions. h.pyori serologies negative    Prior to Admission medications   Medication Sig Start Date End Date Taking? Authorizing Provider  cyclobenzaprine (FLEXERIL) 10 MG tablet Take 1 tablet (10 mg total) by mouth 3 (three) times daily as needed for muscle spasms. 02/16/16  Yes Alycia Rossetti, MD  hydrochlorothiazide (HYDRODIURIL) 25 MG tablet Take 1 tablet (25 mg total) by mouth daily. 02/20/16  Yes Alycia Rossetti, MD  Na Sulfate-K Sulfate-Mg Sulf (SUPREP BOWEL PREP KIT) 17.5-3.13-1.6 GM/180ML SOLN Take 1 kit by mouth as directed. 02/29/16  Yes Danie Binder, MD  Probiotic Product Mathis Bud) CAPS Take 1 capsule daily 02/07/16  Yes Alycia Rossetti, MD  EPINEPHrine (EPI-PEN) 0.3 mg/0.3 mL DEVI Inject 0.3 mLs (0.3 mg total) into the muscle once. 10/11/11   Alycia Rossetti, MD  esomeprazole (NEXIUM) 40 MG capsule Take 1 capsule (40 mg total) by mouth daily. 03/12/16   Alycia Rossetti, MD  omega-3 acid ethyl esters (LOVAZA) 1 g capsule Take 1 capsule (1 g total) by mouth 2 (two) times daily. 02/28/16   Alycia Rossetti, MD  Vitamin D, Ergocalciferol, (DRISDOL) 50000 units CAPS capsule Take  1 capsule (50,000 Units total) by mouth every 7 (seven) days. x6 months 09/28/15   Alycia Rossetti, MD    Allergies as of 02/29/2016 - Review Complete 02/29/2016  Allergen Reaction Noted  . Sulfur Swelling 07/17/2011  . Ibuprofen  09/22/2013  . Penicillins  05/01/2012    Family History  Problem Relation Age of Onset  . Hyperlipidemia Mother   . Seizures Mother   . Colon cancer Neg Hx     Social History   Social History  . Marital status: Married    Spouse name: N/A  . Number of children: 2  . Years of education: N/A   Occupational History  . billing specialist [employer]   Social History Main Topics  . Smoking status: Never Smoker  . Smokeless tobacco: Never Used  . Alcohol use Yes     Comment: wine 1 glass per week  . Drug use: No  . Sexual activity: Yes    Partners: Male   Other Topics Concern  . Not on file   Social History Narrative   Lives w/ husband & 1 son, 1 grown daughter    Review of Systems: See HPI, otherwise negative ROS   Physical Exam: BP 124/85   Pulse 92   Temp 98.4 F (36.9 C) (Oral)   Resp (!) 21   Ht 5' (1.524 m)   Wt 175 lb (79.4 kg)   SpO2 96%   BMI 34.18 kg/m  General:  Alert,  pleasant and cooperative in NAD Head:  Normocephalic and atraumatic. Neck:  Supple; Lungs:  Clear throughout to auscultation.    Heart:  Regular rate and rhythm. Abdomen:  Soft, nontender and nondistended. Normal bowel sounds, without guarding, and without rebound.   Neurologic:  Alert and  oriented x4;  grossly normal neurologically.  Impression/Plan:     SCREENING  Plan:  1. TCS TODAY. DISCUSSED PROCEDURE, BENEFITS, & RISKS: < 1% chance of medication reaction, bleeding, perforation, or rupture of spleen/liver.

## 2016-03-15 ENCOUNTER — Encounter (HOSPITAL_COMMUNITY): Payer: Self-pay | Admitting: Gastroenterology

## 2016-05-22 ENCOUNTER — Other Ambulatory Visit: Payer: Self-pay | Admitting: Family Medicine

## 2016-06-14 ENCOUNTER — Encounter: Payer: Self-pay | Admitting: Family Medicine

## 2016-06-14 ENCOUNTER — Ambulatory Visit (INDEPENDENT_AMBULATORY_CARE_PROVIDER_SITE_OTHER): Payer: Managed Care, Other (non HMO) | Admitting: Family Medicine

## 2016-06-14 VITALS — BP 132/68 | HR 78 | Temp 98.6°F | Resp 14 | Ht 60.0 in | Wt 178.0 lb

## 2016-06-14 DIAGNOSIS — J029 Acute pharyngitis, unspecified: Secondary | ICD-10-CM

## 2016-06-14 DIAGNOSIS — K120 Recurrent oral aphthae: Secondary | ICD-10-CM

## 2016-06-14 LAB — STREP GROUP A AG, W/REFLEX TO CULT: STREGTOCOCCUS GROUP A AG SCREEN: NOT DETECTED

## 2016-06-14 MED ORDER — FIRST-DUKES MOUTHWASH MT SUSP: OROMUCOSAL | 0 refills | Status: DC

## 2016-06-14 MED ORDER — AZITHROMYCIN 250 MG PO TABS: ORAL_TABLET | ORAL | 0 refills | Status: DC

## 2016-06-14 NOTE — Patient Instructions (Signed)
F/U as previous 

## 2016-06-14 NOTE — Progress Notes (Signed)
   Subjective:    Patient ID: Adriana Ramsey, female    DOB: 11/15/1965, 51 y.o.   MRN: [id number]  Patient presents for Sore Throat (x3 days- ulceration to top of palate as well)   3 days of sore throat, has been getting worse, no fever, noticed 2 spots on upper palate, no sick contacts, has been hoarse, mild sinus drainage just started.Marland Kitchen. Has not taken any OTC meds  Mild discomfort with eating    Review Of Systems:  GEN- denies fatigue, fever, weight loss,weakness, recent illness HEENT- denies eye drainage, change in vision, nasal discharge, CVS- denies chest pain, palpitations RESP- denies SOB, cough, wheeze ABD- denies N/V, change in stools, abd pain GU- denies dysuria, hematuria, dribbling, incontinence MSK- denies joint pain, muscle aches, injury Neuro- denies headache, dizziness, syncope, seizure activity       Objective:    BP 132/68   Pulse 78   Temp 98.6 F (37 C) (Oral)   Resp 14   Ht 5' (1.524 m)   Wt 178 lb (80.7 kg)   SpO2 97%   BMI 34.76 kg/m  GEN- NAD, alert and oriented x3 HEENT- PERRL, EOMI, non injected sclera, pink conjunctiva, MMM, oropharynx +injection, mild exudate, apthous ulcer x 2 upper back plate,TM clear bilat no effusion,  No  maxillary sinus tenderness,, nares clear  Neck- Supple, + submandubularLAD CVS- RRR, no murmur RESP-CTAB Pulses- Radial 2+          Assessment & Plan:      Problem List Items Addressed This Visit    None    Visit Diagnoses    Pharyngitis, unspecified etiology    -  Primary   Rapid strep neg, send for culture, use magic mouthwash for ulceras as well, start azithromycin, D/C if culture neg   Relevant Orders   STREP GROUP A AG, W/REFLEX TO CULT (Completed)   Aphthous ulcer of mouth          Note: This dictation was prepared with Dragon dictation along with smaller phrase technology. Any transcriptional errors that result from this process are unintentional.

## 2016-06-16 LAB — CULTURE, GROUP A STREP

## 2016-07-02 ENCOUNTER — Encounter: Payer: Self-pay | Admitting: Gastroenterology

## 2016-08-09 ENCOUNTER — Ambulatory Visit: Payer: Managed Care, Other (non HMO) | Admitting: Family Medicine

## 2016-08-26 ENCOUNTER — Other Ambulatory Visit: Payer: Self-pay | Admitting: Family Medicine

## 2016-09-06 ENCOUNTER — Other Ambulatory Visit: Payer: Managed Care, Other (non HMO)

## 2016-09-06 DIAGNOSIS — E781 Pure hyperglyceridemia: Secondary | ICD-10-CM

## 2016-09-06 DIAGNOSIS — I10 Essential (primary) hypertension: Secondary | ICD-10-CM

## 2016-09-06 DIAGNOSIS — Z79899 Other long term (current) drug therapy: Secondary | ICD-10-CM

## 2016-09-07 LAB — LIPID PANEL
CHOLESTEROL: 185 mg/dL (ref <200)
HDL: 41 mg/dL — AB (ref >50)
LDL Cholesterol: 109 mg/dL — ABNORMAL HIGH (ref <100)
TRIGLYCERIDES: 174 mg/dL — AB (ref <150)
Total CHOL/HDL Ratio: 4.5 Ratio (ref <5.0)
VLDL: 35 mg/dL — AB (ref <30)

## 2016-09-07 LAB — COMPLETE METABOLIC PANEL WITH GFR
ALT: 32 U/L — AB (ref 6–29)
AST: 30 U/L (ref 10–35)
Albumin: 4.5 g/dL (ref 3.6–5.1)
Alkaline Phosphatase: 59 U/L (ref 33–130)
BILIRUBIN TOTAL: 0.5 mg/dL (ref 0.2–1.2)
BUN: 19 mg/dL (ref 7–25)
CALCIUM: 9.6 mg/dL (ref 8.6–10.4)
CHLORIDE: 102 mmol/L (ref 98–110)
CO2: 25 mmol/L (ref 20–32)
CREATININE: 0.86 mg/dL (ref 0.50–1.05)
GFR, EST NON AFRICAN AMERICAN: 78 mL/min (ref >=60)
Glucose, Bld: 109 mg/dL — ABNORMAL HIGH (ref 70–99)
Potassium: 3.5 mmol/L (ref 3.5–5.3)
Sodium: 140 mmol/L (ref 135–146)
Total Protein: 7.2 g/dL (ref 6.1–8.1)

## 2016-10-23 ENCOUNTER — Other Ambulatory Visit: Payer: Self-pay | Admitting: *Deleted

## 2016-10-23 MED ORDER — LANSOPRAZOLE 30 MG PO CPDR: 30.0000 mg | DELAYED_RELEASE_CAPSULE | Freq: Every day | ORAL | 3 refills | Status: DC

## 2016-10-23 NOTE — Telephone Encounter (Signed)
Received call from patient.   Requested refill on prevacid.  Per patient chart, current prescription is Nexium.   Call placed to patient. Reports that she could not afford Nexium, so she resumed Prevacid.   Prescription sent to pharmacy.

## 2016-11-11 ENCOUNTER — Encounter: Payer: Self-pay | Admitting: Family Medicine

## 2016-11-12 MED ORDER — OMEPRAZOLE 40 MG PO CPDR: 40.0000 mg | DELAYED_RELEASE_CAPSULE | Freq: Every day | ORAL | 0 refills | Status: DC

## 2016-11-19 ENCOUNTER — Ambulatory Visit (INDEPENDENT_AMBULATORY_CARE_PROVIDER_SITE_OTHER): Payer: Managed Care, Other (non HMO) | Admitting: Family Medicine

## 2016-11-19 ENCOUNTER — Other Ambulatory Visit: Payer: Self-pay

## 2016-11-19 ENCOUNTER — Encounter: Payer: Self-pay | Admitting: Family Medicine

## 2016-11-19 VITALS — BP 132/68 | HR 80 | Temp 98.4°F | Resp 14 | Ht 60.0 in | Wt 173.0 lb

## 2016-11-19 DIAGNOSIS — K219 Gastro-esophageal reflux disease without esophagitis: Secondary | ICD-10-CM

## 2016-11-19 DIAGNOSIS — K760 Fatty (change of) liver, not elsewhere classified: Secondary | ICD-10-CM | POA: Diagnosis not present

## 2016-11-19 DIAGNOSIS — E559 Vitamin D deficiency, unspecified: Secondary | ICD-10-CM | POA: Diagnosis not present

## 2016-11-19 DIAGNOSIS — Z23 Encounter for immunization: Secondary | ICD-10-CM

## 2016-11-19 DIAGNOSIS — R079 Chest pain, unspecified: Secondary | ICD-10-CM | POA: Diagnosis not present

## 2016-11-19 DIAGNOSIS — R918 Other nonspecific abnormal finding of lung field: Secondary | ICD-10-CM | POA: Diagnosis not present

## 2016-11-19 DIAGNOSIS — I10 Essential (primary) hypertension: Secondary | ICD-10-CM | POA: Diagnosis not present

## 2016-11-19 DIAGNOSIS — E781 Pure hyperglyceridemia: Secondary | ICD-10-CM

## 2016-11-19 LAB — CBC WITH DIFFERENTIAL/PLATELET
BASOS PCT: 1.2 %
Basophils Absolute: 71 cells/uL (ref 0–200)
EOS ABS: 431 {cells}/uL (ref 15–500)
Eosinophils Relative: 7.3 %
HEMATOCRIT: 40.7 % (ref 35.0–45.0)
HEMOGLOBIN: 13.7 g/dL (ref 11.7–15.5)
LYMPHS ABS: 2608 {cells}/uL (ref 850–3900)
MCH: 27.5 pg (ref 27.0–33.0)
MCHC: 33.7 g/dL (ref 32.0–36.0)
MCV: 81.6 fL (ref 80.0–100.0)
MONOS PCT: 8.4 %
MPV: 10.9 fL (ref 7.5–12.5)
NEUTROS ABS: 2295 {cells}/uL (ref 1500–7800)
Neutrophils Relative %: 38.9 %
Platelets: 311 10*3/uL (ref 140–400)
RBC: 4.99 10*6/uL (ref 3.80–5.10)
RDW: 12.4 % (ref 11.0–15.0)
Total Lymphocyte: 44.2 %
WBC mixed population: 496 cells/uL (ref 200–950)
WBC: 5.9 10*3/uL (ref 3.8–10.8)

## 2016-11-19 LAB — COMPREHENSIVE METABOLIC PANEL
AG RATIO: 1.5 (calc) (ref 1.0–2.5)
ALT: 39 U/L — ABNORMAL HIGH (ref 6–29)
AST: 27 U/L (ref 10–35)
Albumin: 4.6 g/dL (ref 3.6–5.1)
Alkaline phosphatase (APISO): 62 U/L (ref 33–130)
BUN: 20 mg/dL (ref 7–25)
CALCIUM: 9.9 mg/dL (ref 8.6–10.4)
CHLORIDE: 99 mmol/L (ref 98–110)
CO2: 33 mmol/L — AB (ref 20–32)
Creat: 0.85 mg/dL (ref 0.50–1.05)
GLOBULIN: 3.1 g/dL (ref 1.9–3.7)
GLUCOSE: 104 mg/dL — AB (ref 65–99)
POTASSIUM: 3.5 mmol/L (ref 3.5–5.3)
SODIUM: 139 mmol/L (ref 135–146)
TOTAL PROTEIN: 7.7 g/dL (ref 6.1–8.1)
Total Bilirubin: 0.5 mg/dL (ref 0.2–1.2)

## 2016-11-19 LAB — LIPID PANEL
Cholesterol: 208 mg/dL — ABNORMAL HIGH (ref <200)
HDL: 49 mg/dL — ABNORMAL LOW (ref >50)
LDL Cholesterol (Calc): 130 mg/dL (calc) — ABNORMAL HIGH
NON-HDL CHOLESTEROL (CALC): 159 mg/dL — AB (ref <130)
Total CHOL/HDL Ratio: 4.2 (calc) (ref <5.0)
Triglycerides: 177 mg/dL — ABNORMAL HIGH (ref <150)

## 2016-11-19 MED ORDER — OMEPRAZOLE 40 MG PO CPDR: 40.0000 mg | DELAYED_RELEASE_CAPSULE | Freq: Every day | ORAL | 2 refills | Status: DC

## 2016-11-19 MED ORDER — HYDROCHLOROTHIAZIDE 25 MG PO TABS: 25.0000 mg | ORAL_TABLET | Freq: Every day | ORAL | 2 refills | Status: AC

## 2016-11-19 MED ORDER — RESTORA PO CAPS: ORAL_CAPSULE | ORAL | 2 refills | Status: DC

## 2016-11-19 NOTE — Progress Notes (Signed)
Subjective:    Patient ID: Adriana Ramsey, female    DOB: 12/05/1965, 51 y.o.   MRN: [id number]  Patient presents for Chest Pressure (states that she has been having some pressure in upper chest and it's the same as when she had GERD sx)   GERD- ran out of medication, her prevacid was not covered, now on omeprazole. Was having chest discomfort with burning and tightness, but also had one day when she could lift her left arm had pain across left side of chest was well. But that is now improved    Lung NODULE- due for repeat CT chest had 8mm node in Right lobe   Mammogram was done in Feb 2018   HTN- taking HCTZ as prescribed  Vitamin D deficiency- she stopped D3 did not know what dose, does take prenatal vitamin daily   Nasal congestion- occasional cough, gets this every year in the fall   Flu shot due     Review Of Systems:  GEN- denies fatigue, fever, weight loss,weakness, recent illness HEENT- denies eye drainage, change in vision, nasal discharge, CVS- denies chest pain, palpitations RESP- denies SOB, cough, wheeze ABD- denies N/V, change in stools, abd pain GU- denies dysuria, hematuria, dribbling, incontinence MSK- denies joint pain, muscle aches, injury Neuro- denies headache, dizziness, syncope, seizure activity       Objective:    BP 132/68   Pulse 80   Temp 98.4 F (36.9 C) (Oral)   Resp 14   Ht 5' (1.524 m)   Wt 173 lb (78.5 kg)   SpO2 99%   BMI 33.79 kg/m  GEN- NAD, alert and oriented x3 HEENT- PERRL, EOMI, non injected sclera, pink conjunctiva, MMM, oropharynx clear, nares clear rhinorrhea Neck- Supple, no thyromegaly CVS- RRR, no murmur RESP-CTAB ABD-NABS,soft,NT,ND EXT- No edema Pulses- Radial, DP- 2+  EKG-NSR, poor r wave progression, no ST changes  Previous EKG 2013       Assessment & Plan:      Problem List Items Addressed This Visit      Unprioritized   Hypertriglyceridemia   Relevant Medications   hydrochlorothiazide  (HYDRODIURIL) 25 MG tablet   Other Relevant Orders   Lipid panel   Vitamin D deficiency   Fatty liver   Relevant Orders   Lipid panel   Lung nodules    Repeat CT of chest, non smoker      GERD (gastroesophageal reflux disease)    Based on history her symptoms are mostly GI related with her reflux.  We will have her take the omeprazole for another week to get in her system and see if her symptoms completely resolve as they are already improved.  She is still having chest pain despite the medication she does have risk factors for heart disease we will send her to cardiology for evaluation.  I am going to check her labs today.      Relevant Medications   Probiotic Product (RESTORA) CAPS   omeprazole (PRILOSEC) 40 MG capsule   Essential hypertension, benign - Primary    Controlled no changes      Relevant Medications   hydrochlorothiazide (HYDRODIURIL) 25 MG tablet   Other Relevant Orders   CBC with Differential/Platelet   Comprehensive metabolic panel    Other Visit Diagnoses    Chest pain, unspecified type       Relevant Orders   EKG 12-Lead (Completed)   Need for immunization against influenza       Relevant Orders  Flu Vaccine QUAD 36+ mos IM (Completed)      Note: This dictation was prepared with Dragon dictation along with smaller phrase technology. Any transcriptional errors that result from this process are unintentional.

## 2016-11-19 NOTE — Assessment & Plan Note (Signed)
Controlled no changes 

## 2016-11-19 NOTE — Assessment & Plan Note (Signed)
Repeat CT of chest, non smoker

## 2016-11-19 NOTE — Assessment & Plan Note (Signed)
Based on history her symptoms are mostly GI related with her reflux.  We will have her take the omeprazole for another week to get in her system and see if her symptoms completely resolve as they are already improved.  She is still having chest pain despite the medication she does have risk factors for heart disease we will send her to cardiology for evaluation.  I am going to check her labs today.

## 2016-11-19 NOTE — Patient Instructions (Addendum)
CT chest to be done  Call me if chest pain does not go away with acid medication We will call with lab results FLu shot given  Take over the counter anti-histamine F/U 4 months PHYSICAL

## 2016-11-21 ENCOUNTER — Encounter: Payer: Self-pay | Admitting: *Deleted

## 2016-12-02 ENCOUNTER — Ambulatory Visit (HOSPITAL_COMMUNITY)
Admission: RE | Admit: 2016-12-02 | Discharge: 2016-12-02 | Disposition: A | Discharge status: Home / Self Care | Payer: Managed Care, Other (non HMO) | Source: Ambulatory Visit | Attending: Family Medicine | Admitting: Family Medicine

## 2016-12-02 DIAGNOSIS — R918 Other nonspecific abnormal finding of lung field: Secondary | ICD-10-CM | POA: Diagnosis present

## 2016-12-03 ENCOUNTER — Encounter: Payer: Self-pay | Admitting: *Deleted

## 2016-12-03 NOTE — Telephone Encounter (Deleted)
-----   Message from Durwin NoraChristina H Six, LPN sent at 16/10/960411/27/2018  9:27 AM EST ----- Regarding: CT Scan Lung Repeat scan to F/U lung nodule

## 2016-12-03 NOTE — Telephone Encounter (Signed)
This encounter was created in error - please disregard.

## 2017-01-20 ENCOUNTER — Telehealth: Payer: Self-pay | Admitting: *Deleted

## 2017-01-20 NOTE — Telephone Encounter (Signed)
Call placed to patient and patient made aware.   Appointment scheduled.  

## 2017-01-20 NOTE — Telephone Encounter (Signed)
Needs OV.  

## 2017-01-20 NOTE — Telephone Encounter (Signed)
Received call from patient.   Reports that she has infected tooth that is causing her pain. Reports that she is not able to get into the dentist X3 weeks.   Requested MD prescribe ABTx until she can be seen by dentist.   MD please advise.

## 2017-01-22 ENCOUNTER — Ambulatory Visit (INDEPENDENT_AMBULATORY_CARE_PROVIDER_SITE_OTHER): Payer: Managed Care, Other (non HMO) | Admitting: Family Medicine

## 2017-01-22 ENCOUNTER — Other Ambulatory Visit: Payer: Self-pay

## 2017-01-22 ENCOUNTER — Encounter: Payer: Self-pay | Admitting: Family Medicine

## 2017-01-22 VITALS — BP 132/68 | HR 82 | Temp 98.5°F | Resp 14 | Ht 60.0 in | Wt 172.0 lb

## 2017-01-22 DIAGNOSIS — K047 Periapical abscess without sinus: Secondary | ICD-10-CM | POA: Diagnosis not present

## 2017-01-22 DIAGNOSIS — R300 Dysuria: Secondary | ICD-10-CM

## 2017-01-22 LAB — URINALYSIS, ROUTINE W REFLEX MICROSCOPIC
BILIRUBIN URINE: NEGATIVE
GLUCOSE, UA: NEGATIVE
Ketones, ur: NEGATIVE
Leukocytes, UA: NEGATIVE
Nitrite: NEGATIVE
Protein, ur: NEGATIVE
SPECIFIC GRAVITY, URINE: 1.024 (ref 1.001–1.03)
WBC, UA: NONE SEEN /HPF (ref 0–5)
pH: 5.5 (ref 5.0–8.0)

## 2017-01-22 LAB — MICROSCOPIC MESSAGE

## 2017-01-22 MED ORDER — ACETAMINOPHEN-CODEINE 300-30 MG PO TABS: 1.0000 | ORAL_TABLET | Freq: Four times a day (QID) | ORAL | 0 refills | Status: DC | PRN

## 2017-01-22 MED ORDER — CLINDAMYCIN HCL 300 MG PO CAPS: 300.0000 mg | ORAL_CAPSULE | Freq: Three times a day (TID) | ORAL | 0 refills | Status: DC

## 2017-01-22 NOTE — Progress Notes (Signed)
   Subjective:    Patient ID: Adriana Ramsey, female    DOB: 12/23/1965, 52 y.o.   MRN: [id number]  Patient presents for Tooth Pain (tooth #18 broken and now has pain in gum)   Pt here with dental abscess, has broken tooth in gum, tried to have remove 1.5 years. Swelling pain, around lef tlower gumline Taking inbprofen Used rinse peroxide  Has appt with oral surgery. Has had some chills, no fever    Has had some mild burning with urination on and off past few days  Review Of Systems:  GEN- denies fatigue, fever, weight loss,weakness, recent illness HEENT- denies eye drainage, change in vision, nasal discharge, CVS- denies chest pain, palpitations RESP- denies SOB, cough, wheeze ABD- denies N/V, change in stools, abd pain GU- denies dysuria, hematuria, dribbling, incontinence MSK- denies joint pain, muscle aches, injury Neuro- denies headache, dizziness, syncope, seizure activity       Objective:    BP 132/68   Pulse 82   Temp 98.5 F (36.9 C) (Oral)   Resp 14   Ht 5' (1.524 m)   Wt 172 lb (78 kg)   SpO2 98%   BMI 33.59 kg/m  GEN- NAD, alert and oriented x3 HEENT- PERRL, EOMI, non injected sclera, pink conjunctiva, MMM, oropharynx clearfair dentition, left lower gumline broken tooth with erythema, gum swelling, TTP Neck- Supple,shotty left LAD, mild swelling left jawline  CVS- RRR, no murmur RESP-CTAB ABD-NABS,soft,NT,ND, no CVA tenderness  EXT- No edema Pulses- Radial, DP- 2+        Assessment & Plan:      Problem List Items Addressed This Visit    None    Visit Diagnoses    Tooth abscess    -  Primary   she  has appt with oral surgeon for removal, start clindamycin, salt water rinse, tylenol #3 given temporarily for pain   Dysuria       send for culture, some blood   Relevant Orders   Urinalysis, Routine w reflex microscopic (Completed)   Urine Culture      Note: This dictation was prepared with Dragon dictation along with smaller phrase  technology. Any transcriptional errors that result from this process are unintentional.

## 2017-01-22 NOTE — Patient Instructions (Signed)
Take clindamycin Take pain medication as needed F/U as previous

## 2017-01-23 ENCOUNTER — Other Ambulatory Visit: Payer: Self-pay | Admitting: *Deleted

## 2017-01-23 MED ORDER — FLUCONAZOLE 150 MG PO TABS: 150.0000 mg | ORAL_TABLET | Freq: Once | ORAL | 0 refills | Status: AC

## 2017-01-24 ENCOUNTER — Other Ambulatory Visit: Payer: Self-pay | Admitting: *Deleted

## 2017-01-24 DIAGNOSIS — R3129 Other microscopic hematuria: Secondary | ICD-10-CM

## 2017-01-24 LAB — URINE CULTURE
MICRO NUMBER:: 90068572
Result:: NO GROWTH
SPECIMEN QUALITY:: ADEQUATE

## 2017-02-19 NOTE — Progress Notes (Signed)
REVIEWED-NO ADDITIONAL RECOMMENDATIONS. 

## 2017-02-25 ENCOUNTER — Encounter: Payer: Self-pay | Admitting: Family Medicine

## 2017-03-25 ENCOUNTER — Other Ambulatory Visit: Payer: Self-pay

## 2017-03-25 ENCOUNTER — Ambulatory Visit (INDEPENDENT_AMBULATORY_CARE_PROVIDER_SITE_OTHER): Payer: Managed Care, Other (non HMO) | Admitting: Family Medicine

## 2017-03-25 ENCOUNTER — Encounter: Payer: Self-pay | Admitting: Family Medicine

## 2017-03-25 VITALS — BP 128/74 | HR 80 | Temp 98.4°F | Resp 14 | Ht 60.0 in | Wt 171.0 lb

## 2017-03-25 DIAGNOSIS — Z1231 Encounter for screening mammogram for malignant neoplasm of breast: Secondary | ICD-10-CM | POA: Diagnosis not present

## 2017-03-25 DIAGNOSIS — K760 Fatty (change of) liver, not elsewhere classified: Secondary | ICD-10-CM

## 2017-03-25 DIAGNOSIS — Z23 Encounter for immunization: Secondary | ICD-10-CM

## 2017-03-25 DIAGNOSIS — I10 Essential (primary) hypertension: Secondary | ICD-10-CM | POA: Diagnosis not present

## 2017-03-25 DIAGNOSIS — Z1239 Encounter for other screening for malignant neoplasm of breast: Secondary | ICD-10-CM

## 2017-03-25 DIAGNOSIS — E781 Pure hyperglyceridemia: Secondary | ICD-10-CM

## 2017-03-25 DIAGNOSIS — E669 Obesity, unspecified: Secondary | ICD-10-CM | POA: Diagnosis not present

## 2017-03-25 DIAGNOSIS — W57XXXA Bitten or stung by nonvenomous insect and other nonvenomous arthropods, initial encounter: Secondary | ICD-10-CM | POA: Diagnosis not present

## 2017-03-25 DIAGNOSIS — Z0000 Encounter for general adult medical examination without abnormal findings: Secondary | ICD-10-CM | POA: Diagnosis not present

## 2017-03-25 DIAGNOSIS — E559 Vitamin D deficiency, unspecified: Secondary | ICD-10-CM | POA: Diagnosis not present

## 2017-03-25 MED ORDER — VITAMIN D 1000 UNITS PO TABS: 1000.0000 [IU] | ORAL_TABLET | Freq: Every day | ORAL | Status: DC

## 2017-03-25 MED ORDER — TRIAMCINOLONE ACETONIDE 0.1 % EX CREA: 1.0000 "application " | TOPICAL_CREAM | Freq: Two times a day (BID) | CUTANEOUS | 0 refills | Status: DC

## 2017-03-25 MED ORDER — PHENTERMINE HCL 37.5 MG PO TABS: 37.5000 mg | ORAL_TABLET | Freq: Every day | ORAL | 2 refills | Status: DC

## 2017-03-25 MED ORDER — CYCLOBENZAPRINE HCL 10 MG PO TABS: 10.0000 mg | ORAL_TABLET | Freq: Three times a day (TID) | ORAL | 1 refills | Status: DC | PRN

## 2017-03-25 MED ORDER — ZOSTER VAC RECOMB ADJUVANTED 50 MCG/0.5ML IM SUSR: 0.5000 mL | Freq: Once | INTRAMUSCULAR | 0 refills | Status: AC

## 2017-03-25 NOTE — Assessment & Plan Note (Signed)
Trial of phentermine again,discussed SE of medication

## 2017-03-25 NOTE — Patient Instructions (Addendum)
Shingles vaccine sent to pharmacy TDAP given  Schedule your mammogram  I recommend eye visit once a year I recommend dental visit every 6 months Goal is to  Exercise 30 minutes 5 days a week We will call with lab results  Phentermine take 1/2 tablet for 2 weeks, then 1 tablet daily  F/U 3 months  For weight

## 2017-03-25 NOTE — Assessment & Plan Note (Signed)
Controlled no changes, would benefit from weight loss of 20-30lbs

## 2017-03-25 NOTE — Progress Notes (Signed)
   Subjective:    Patient ID: Adriana Ramsey, female    DOB: 08/27/1965, 52 y.o.   MRN: [id number]  Patient presents for CPE (is fasting) Pt here for CPE  Due for TDAP/shingles  Colonoscopy UTD  Mammogram- Due  S/p hystrectomy  Obesity- wants to try Phentermine again, cutting out fast food, eating more veggies, cutting out salt  Working out with husband   Taking Vitamin D    Rash on back of right thigh noticed past few days,mild itching   Review Of Systems:  GEN- denies fatigue, fever, weight loss,weakness, recent illness HEENT- denies eye drainage, change in vision, nasal discharge, CVS- denies chest pain, palpitations RESP- denies SOB, cough, wheeze ABD- denies N/V, change in stools, abd pain GU- denies dysuria, hematuria, dribbling, incontinence MSK- denies joint pain, muscle aches, injury Neuro- denies headache, dizziness, syncope, seizure activity       Objective:    BP 128/74   Pulse 80   Temp 98.4 F (36.9 C) (Oral)   Resp 14   Ht 5' (1.524 m)   Wt 171 lb (77.6 kg)   SpO2 98%   BMI 33.40 kg/m  GEN- NAD, alert and oriented x3 HEENT- PERRL, EOMI, non injected sclera, pink conjunctiva, MMM, oropharynx clear Neck- Supple, no thyromegaly CVS- RRR, no murmur RESP-CTAB ABD-NABS,soft,NT,ND EXT- No edema Skin- quarter size raised erytematous rash, NT, no fluctance  Pulses- Radial, DP- 2+        Assessment & Plan:      Problem List Items Addressed This Visit      Unprioritized   Hypertriglyceridemia   Relevant Orders   Lipid panel   Vitamin D deficiency   Relevant Orders   Vitamin D, 25-hydroxy   Fatty liver   Obesity (BMI 30-39.9)    Trial of phentermine again,discussed SE of medication      Relevant Medications   phentermine (ADIPEX-P) 37.5 MG tablet   Essential hypertension, benign    Controlled no changes, would benefit from weight loss of 20-30lbs      Relevant Orders   CBC with Differential/Platelet   Comprehensive metabolic  panel    Other Visit Diagnoses    Routine general medical examination at a health care facility    -  Primary   CPE done TDAP, given, shingles to pharmacy, fasting labs, schedule mammogram   Relevant Orders   CBC with Differential/Platelet   Comprehensive metabolic panel   Lipid panel   Breast cancer screening       Relevant Orders   MM SCREENING BREAST TOMO BILATERAL   Bug bite, initial encounter       appears to be localized bite with reaction, no sign of cellutitis, treat with topical steroid      Note: This dictation was prepared with Dragon dictation along with smaller phrase technology. Any transcriptional errors that result from this process are unintentional.

## 2017-03-26 LAB — CBC WITH DIFFERENTIAL/PLATELET
BASOS ABS: 40 {cells}/uL (ref 0–200)
Basophils Relative: 1 %
EOS ABS: 212 {cells}/uL (ref 15–500)
Eosinophils Relative: 5.3 %
HCT: 40.2 % (ref 35.0–45.0)
Hemoglobin: 13.8 g/dL (ref 11.7–15.5)
Lymphs Abs: 1664 cells/uL (ref 850–3900)
MCH: 27.5 pg (ref 27.0–33.0)
MCHC: 34.3 g/dL (ref 32.0–36.0)
MCV: 80.2 fL (ref 80.0–100.0)
MONOS PCT: 12.5 %
MPV: 11 fL (ref 7.5–12.5)
Neutro Abs: 1584 cells/uL (ref 1500–7800)
Neutrophils Relative %: 39.6 %
PLATELETS: 302 10*3/uL (ref 140–400)
RBC: 5.01 10*6/uL (ref 3.80–5.10)
RDW: 12.2 % (ref 11.0–15.0)
TOTAL LYMPHOCYTE: 41.6 %
WBC mixed population: 500 cells/uL (ref 200–950)
WBC: 4 10*3/uL (ref 3.8–10.8)

## 2017-03-26 LAB — COMPREHENSIVE METABOLIC PANEL
AG Ratio: 1.4 (calc) (ref 1.0–2.5)
ALBUMIN MSPROF: 4.5 g/dL (ref 3.6–5.1)
ALKALINE PHOSPHATASE (APISO): 68 U/L (ref 33–130)
ALT: 32 U/L — AB (ref 6–29)
AST: 30 U/L (ref 10–35)
BUN: 15 mg/dL (ref 7–25)
CO2: 31 mmol/L (ref 20–32)
Calcium: 10 mg/dL (ref 8.6–10.4)
Chloride: 102 mmol/L (ref 98–110)
Creat: 0.85 mg/dL (ref 0.50–1.05)
Globulin: 3.2 g/dL (calc) (ref 1.9–3.7)
Glucose, Bld: 103 mg/dL — ABNORMAL HIGH (ref 65–99)
POTASSIUM: 4 mmol/L (ref 3.5–5.3)
Sodium: 140 mmol/L (ref 135–146)
Total Bilirubin: 0.7 mg/dL (ref 0.2–1.2)
Total Protein: 7.7 g/dL (ref 6.1–8.1)

## 2017-03-26 LAB — LIPID PANEL
CHOLESTEROL: 189 mg/dL (ref <200)
HDL: 44 mg/dL — AB (ref >50)
LDL CHOLESTEROL (CALC): 117 mg/dL — AB
Non-HDL Cholesterol (Calc): 145 mg/dL (calc) — ABNORMAL HIGH (ref <130)
TRIGLYCERIDES: 165 mg/dL — AB (ref <150)
Total CHOL/HDL Ratio: 4.3 (calc) (ref <5.0)

## 2017-03-26 LAB — VITAMIN D 25 HYDROXY (VIT D DEFICIENCY, FRACTURES): VIT D 25 HYDROXY: 27 ng/mL — AB (ref 30–100)

## 2017-03-31 ENCOUNTER — Encounter: Payer: Self-pay | Admitting: Family Medicine

## 2017-04-04 ENCOUNTER — Other Ambulatory Visit: Payer: Self-pay | Admitting: Family Medicine

## 2017-04-16 ENCOUNTER — Encounter (HOSPITAL_COMMUNITY): Payer: Self-pay

## 2017-04-16 ENCOUNTER — Ambulatory Visit (HOSPITAL_COMMUNITY)
Admission: RE | Admit: 2017-04-16 | Discharge: 2017-04-16 | Disposition: A | Discharge status: Home / Self Care | Payer: Managed Care, Other (non HMO) | Source: Ambulatory Visit | Attending: Family Medicine | Admitting: Family Medicine

## 2017-04-16 DIAGNOSIS — Z1239 Encounter for other screening for malignant neoplasm of breast: Secondary | ICD-10-CM

## 2017-04-16 DIAGNOSIS — Z1231 Encounter for screening mammogram for malignant neoplasm of breast: Secondary | ICD-10-CM | POA: Insufficient documentation

## 2017-06-27 ENCOUNTER — Ambulatory Visit: Payer: Managed Care, Other (non HMO) | Admitting: Family Medicine

## 2017-07-07 ENCOUNTER — Encounter: Payer: Self-pay | Admitting: Family Medicine

## 2017-07-28 ENCOUNTER — Encounter: Payer: Self-pay | Admitting: Family Medicine

## 2018-03-11 ENCOUNTER — Other Ambulatory Visit: Payer: Self-pay

## 2018-03-11 ENCOUNTER — Ambulatory Visit: Payer: Managed Care, Other (non HMO) | Admitting: Orthopedic Surgery

## 2018-03-11 ENCOUNTER — Encounter: Payer: Self-pay | Admitting: Orthopedic Surgery

## 2018-03-11 ENCOUNTER — Ambulatory Visit (INDEPENDENT_AMBULATORY_CARE_PROVIDER_SITE_OTHER): Payer: Managed Care, Other (non HMO)

## 2018-03-11 VITALS — BP 119/80 | HR 94 | Ht 59.0 in | Wt 165.0 lb

## 2018-03-11 DIAGNOSIS — M25532 Pain in left wrist: Secondary | ICD-10-CM

## 2018-03-11 DIAGNOSIS — M24131 Other articular cartilage disorders, right wrist: Secondary | ICD-10-CM

## 2018-03-11 NOTE — Patient Instructions (Signed)
These are the muscle and arthrits creams I recommend: 3 times a day   PLEASE READ THE PACKAGE INSTRUCTIONS BEFORE USING   Ben Gay arthritis cream  Icy hot vanishing gel  Aspercreme odor free  Myoflex Oderless pain reliever  Capzasin  Sportscreme  Max freeze

## 2018-03-11 NOTE — Progress Notes (Signed)
NEW PATIENT OFFICE VISIT  Chief Complaint  Patient presents with  . 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  Pain is intermittent seems to be activity related worse with supination activities questionable swelling questionable numbness and tingling in the small finger   Review of Systems  Constitutional: Negative for chills, fever and weight loss.  Respiratory: Negative for shortness of breath.   Cardiovascular: Negative for chest pain.  Neurological: Positive for tingling.     Past Medical History:  Diagnosis Date  . Allergy   . Gallbladder polyp 2012   Last ultrasound 2015, no evidence of gallbladder polyp  . GERD (gastroesophageal reflux disease)   . Hypertension   . PUD (peptic ulcer disease) 2011    Past Surgical History:  Procedure Laterality Date  . ABDOMINAL HYSTERECTOMY     partial   . BREAST REDUCTION SURGERY  2007  . CESAREAN SECTION     2  . COLONOSCOPY N/A 03/14/2016   Procedure: COLONOSCOPY;  Surgeon: West Bali, MD;  Location: [facility];  Service: Endoscopy;  Laterality: N/A;  12:15 pm  . ESOPHAGOGASTRODUODENOSCOPY  11/23/2009   NUR: hypopharyngeal erosions of unclear sidnificance/mild changes of reflux /small sliding hiatal hernia and non Schatzki ring/3-mm gastric ulcer along the antrum with 2 scars and few erosions. h.pyori serologies negative  . REDUCTION MAMMAPLASTY Bilateral     Family History  Problem Relation Age of Onset  . Hyperlipidemia Mother   . Seizures Mother   . Colon cancer Neg Hx    Social History   Tobacco Use  . Smoking status: Never Smoker  . Smokeless tobacco: Never Used  Substance Use Topics  . Alcohol use: Yes    Comment: wine 1 glass per week  . Drug use: No    Allergies  Allergen Reactions  . Sulfur Swelling  . Ibuprofen   .  Penicillins     Current Meds  Medication Sig  . hydrochlorothiazide (HYDRODIURIL) 25 MG tablet Take 1 tablet (25 mg total) daily by mouth.    BP 119/80   Pulse 94   Ht 4\' 11"  (1.499 m)   Wt 165 lb (74.8 kg)   BMI 33.33 kg/m   Physical Exam Vitals signs reviewed.  Constitutional:      Appearance: Normal appearance. She is well-developed.  Neurological:     Mental Status: She is alert and oriented to person, place, and time.  Psychiatric:        Attention and Perception: Attention normal.        Mood and Affect: Mood and affect normal.        Speech: Speech normal.        Behavior: Behavior normal.        Thought Content: Thought content normal.        Judgment: Judgment normal.     Ortho Exam  Left wrist no tenderness full range of motion no instability on testing muscle tone and strength are normal no tremor neurovascular exam is intact pulse and perfusion are normal  Right wrist tenderness over the ulnar styloid and TFCC with painful supination no subluxation is appreciated at the radial ulnar joint or radiocarpal joint muscle tone and strength are normal there are no tremor skin is warm dry and intact without ulceration pulse and perfusion  are normal gross sensory exam is intact  MEDICAL DECISION SECTION  An x-ray of the wrist was ordered in the office My independent reading of xrays:  X-ray was normal see report separate dictation  Encounter Diagnoses  Name Primary?  . Left wrist pain Yes  . Degenerative TFCC tear, right, differential diagnosis     PLAN: (Rx., injectx, surgery, frx, mri/ct) Splinting 6 weeks Topical NSAIDs secondary to GERD and peptic ulcer disease Fu 6 weeks   No orders of the defined types were placed in this encounter.   Fuller Canada, MD  03/11/2018 11:05 AM

## 2018-04-22 ENCOUNTER — Ambulatory Visit: Payer: Self-pay | Admitting: Orthopedic Surgery

## 2018-04-22 ENCOUNTER — Telehealth: Payer: Self-pay | Admitting: Orthopedic Surgery

## 2018-04-22 NOTE — Telephone Encounter (Signed)
Next weds in person 1150 unless her pain has gone away if so then virtual

## 2018-04-22 NOTE — Telephone Encounter (Signed)
Called patient; scheduled accordingly; aware. 

## 2018-04-22 NOTE — Telephone Encounter (Signed)
Patient had forgotten about her appointment for today, 04/22/18 for left wrist pain.  Re-schedule for in-person appointment or for virtual visit?

## 2018-04-29 ENCOUNTER — Encounter: Payer: Self-pay | Admitting: Orthopedic Surgery

## 2018-04-29 ENCOUNTER — Other Ambulatory Visit: Payer: Self-pay

## 2018-04-29 ENCOUNTER — Ambulatory Visit (INDEPENDENT_AMBULATORY_CARE_PROVIDER_SITE_OTHER): Payer: Managed Care, Other (non HMO) | Admitting: Orthopedic Surgery

## 2018-04-29 VITALS — BP 122/82 | HR 74 | Ht 59.0 in | Wt 168.0 lb

## 2018-04-29 DIAGNOSIS — M24131 Other articular cartilage disorders, right wrist: Secondary | ICD-10-CM | POA: Diagnosis not present

## 2018-04-29 NOTE — Progress Notes (Signed)
Progress Note   Patient ID: Adriana Ramsey, female   DOB: 1965-04-25, 53 y.o.   MRN: [id number]   Chief Complaint  Patient presents with  . 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  Pain is intermittent seems to be activity related worse with supination activities questionable swelling questionable numbness and tingling in the small finger  Right wrist tenderness over the ulnar styloid and TFCC with painful supination no subluxation is appreciated at the radial ulnar joint or radiocarpal joint muscle tone and strength are normal there are no tremor skin is warm dry and intact without ulceration pulse and perfusion are normal gross sensory exam is intact  MEDICAL DECISION SECTION  An x-ray of the wrist was ordered in the office My independent reading of xrays:  X-ray was normal see report separate dictation  Encounter Diagnoses Name Primary? . Left wrist pain Yes . Degenerative TFCC tear, right, differential diagnosis MEDICAL DECISION SECTION  An x-ray of the wrist was ordered in the office My independent reading of xrays:  X-ray was normal see report separate dictation    PLAN: (Rx., injectx, surgery, frx, mri/ct) Splinting 6 weeks Topical NSAIDs secondary to GERD and peptic ulcer disease Fu 6 weeks     Review of Systems  Neurological: Negative for tingling.     Allergies  Allergen Reactions  . Sulfur Swelling  . Ibuprofen   . Penicillins      BP 122/82   Pulse 74   Ht 4\' 11"  (1.499 m)   Wt 168 lb (76.2 kg)   BMI 33.93 kg/m   Physical Exam Vitals signs and nursing note reviewed.  Constitutional:      Appearance: Normal appearance.  Musculoskeletal:     Right wrist: She exhibits tenderness. She exhibits normal range of  motion, no bony tenderness, no swelling, no effusion, no crepitus, no deformity and no laceration.       Arms:  Neurological:     Mental Status: She is alert and oriented to person, place, and time.  Psychiatric:        Mood and Affect: Mood normal.      Medical decisions:   Data  Imaging:   xrays were neg last time   No diagnosis found.  PLAN:   Recommend 2 weeks of bracing to continue to see if we can get the last bit of pain to go away  Virtual visit in 2 weeks    Fuller Canada, MD 04/29/2018 11:58 AM

## 2018-05-13 ENCOUNTER — Other Ambulatory Visit: Payer: Self-pay

## 2018-05-13 ENCOUNTER — Ambulatory Visit (INDEPENDENT_AMBULATORY_CARE_PROVIDER_SITE_OTHER): Payer: Managed Care, Other (non HMO) | Admitting: Orthopedic Surgery

## 2018-05-13 DIAGNOSIS — M24131 Other articular cartilage disorders, right wrist: Secondary | ICD-10-CM | POA: Diagnosis not present

## 2018-05-13 NOTE — Progress Notes (Signed)
Virtual Visit via Telephone Note  I connected with [employer] on 05/13/18 at  8:30 AM EDT by telephone and verified that I am speaking with the correct person using two identifiers.  Location: Patient: home Provider: office   I discussed the limitations, risks, security and privacy concerns of performing an evaluation and management service by telephone and the availability of in person appointments. I also discussed with the patient that there may be a patient responsible charge related to this service. The patient expressed understanding and agreed to proceed.     I discussed the assessment and treatment plan with the patient. The patient was provided an opportunity to ask questions and all were answered. The patient agreed with the plan and demonstrated an understanding of the instructions.   The patient was advised to call back or seek an in-person evaluation if the symptoms worsen or if the condition fails to improve as anticipated.  I provided 4 minutes of non-face-to-face time during this encounter.   Chief Complaint  Patient presents with  . Wrist Pain    Ulnar-sided right wrist pain     History this is a 53 year old female who is been treated for pain along the TFCC diagnosed with probable TFCC degenerative tear.  She was treated with splinting topical NSAIDs secondary to severe GERD and peptic ulcer disease and allergy with hives with ibuprofen.  She has progressively improved over the last 6 to 8 weeks and only has occasional discomfort at this time depending on how her day goes.  Encounter Diagnosis  Name Primary?  . Degenerative TFCC tear, right, differential diagnosis Yes     Plan  Recommend ice for 20 to 30 minutes as needed.  Wear brace as needed.  Use icy hot or Aspercreme for pain.  Patient was released   Fuller Canada, MD

## 2018-05-13 NOTE — Patient Instructions (Signed)
Instructions were to use ice for 20 to 30 minutes as needed, use Aspercreme or icy hot topical medication and use wrist splint as needed  Patient released

## 2018-06-08 ENCOUNTER — Other Ambulatory Visit (HOSPITAL_COMMUNITY): Payer: Self-pay | Admitting: Family Medicine

## 2018-06-08 DIAGNOSIS — Z1231 Encounter for screening mammogram for malignant neoplasm of breast: Secondary | ICD-10-CM

## 2018-06-15 ENCOUNTER — Other Ambulatory Visit: Payer: Self-pay

## 2018-06-15 ENCOUNTER — Ambulatory Visit (HOSPITAL_COMMUNITY)
Admission: RE | Admit: 2018-06-15 | Discharge: 2018-06-15 | Disposition: A | Discharge status: Home / Self Care | Payer: Managed Care, Other (non HMO) | Source: Ambulatory Visit | Attending: Family Medicine | Admitting: Family Medicine

## 2018-06-15 DIAGNOSIS — Z1231 Encounter for screening mammogram for malignant neoplasm of breast: Secondary | ICD-10-CM | POA: Insufficient documentation

## 2018-11-22 ENCOUNTER — Encounter: Payer: Self-pay | Admitting: Gastroenterology

## 2018-11-22 NOTE — Progress Notes (Addendum)
REVIEWED-NO ADDITIONAL RECOMMENDATIONS.  Referring Provider: Iona Beard, MD Primary Care Physician:  Iona Beard, MD Primary GI Physician: Dr. Oneida Alar  Chief Complaint  Patient presents with   Gastroesophageal Reflux    HPI:   Adriana Ramsey is a 53 y.o. female presenting today with a history of fatty liver with minimally elevated AST/ALT with previous viral markers for hep B and C negative, GERD, IBS constipation type, and PUD in 2011.  She presents today at the request of Iona Beard, MD for GERD and acid reflux.   She was last seen in our office on 02/29/2016 for IBS with constipation, fatty liver, and to schedule her screening colonoscopy.  GERD symptoms were doing well after switching from Prevacid to Nexium.  Historically chronic constipation did well on Restora which had recently been restarted but she continued to have straining.  No alarm symptoms.  She had eliminated fatty foods from her diet was trying to increase physical activity.  She was scheduled for her colonoscopy, provided samples of Linzess 72 mcg, and counseled on diet, exercise, and weight loss for fatty liver with plans to monitor LFTs every 6 months.  Colonoscopy on 03/14/2016: Mild diverticulosis in the sigmoid and descending colon, redundant rectosigmoid colon, external and internal hemorrhoids.  Recommended repeat colonoscopy in 10 years.  Today she states she has burning in her throat and chest. Started about 4 weeks ago. Has been taking omeprazole daily for over 1 year. Can't remember why she switched from Nexium to omeprazole. Last week, she increased to omeprazole to BID. Feels like it has helped some. States it isn't as "on fine and as burning as it was." Still feels burning daily, just to a lesser degree. Feels she is having to clear her throat a lot. Will eat ice chips to help with burning. Reports having to swallow an extra time to get her food to go down. Started about 2-3 weeks ago. Typically with  meats or breads. Soft foods and liquids go down fine. Occurred a couple of time in the last 2 weeks. No odynophagia. She has been staying away from spicy foods. Tries to stay away from fatty foods. Drinks soda occasionally, maybe 1 every 2 days. No nausea or vomiting. No abdominal pain. No brbpr or melena. No NSAIDs. No unintentional weight loss.   BMs daily. Constipation is much better. Has increased her vegetable and water intake. Not currently taking Linzess.   No fever or chills.  No nasal congestion, PND. No sore throat.   1 glass of wine about once a week.    Fatty Liver: LFTs normal in July 2020. No jaundice, confusion, swelling in abdomen or lower extremities, or bruising/bleeding.   Denies lightheadedness, dizziness, or presyncope.  No heart palpitation.  Admits to sensation of heaviness in her chest. Also with "random twinges" on and off. Heaviness is separate from the random twinges. Reports the twinges have decreased some with omeprazole BID. Symptoms not associated with exertion. History of HTN. Family history of MGF with MI at age 85. Chest heaviness has occurred 2-3 times in the last week or two. Last about 2-3 minutes. No more than 5. Typically when sitting down.  No associated shortness of breath, diaphoresis, nausea, palpitations.  Past Medical History:  Diagnosis Date   Allergy    Fatty liver    Gallbladder polyp 2012   Last ultrasound 2015, no evidence of gallbladder polyp   GERD (gastroesophageal reflux disease)    Hypertension    PUD (peptic ulcer  disease) 2011    Past Surgical History:  Procedure Laterality Date   ABDOMINAL HYSTERECTOMY     partial    BREAST REDUCTION SURGERY  2007   CESAREAN SECTION     2   COLONOSCOPY N/A 03/14/2016   Procedure: COLONOSCOPY;  Surgeon: Danie Binder, MD;  [facility];  Service: Endoscopy;  Laterality: N/A;  12:15 pm   ESOPHAGOGASTRODUODENOSCOPY  11/23/2009   NUR: hypopharyngeal erosions of unclear  sidnificance/mild changes of reflux /small sliding hiatal hernia and non Schatzki ring/3-mm gastric ulcer along the antrum with 2 scars and few erosions. h.pyori serologies negative   REDUCTION MAMMAPLASTY Bilateral     Current Outpatient Medications  Medication Sig Dispense Refill   cholecalciferol (VITAMIN D) 1000 units tablet Take 1 tablet (1,000 Units total) by mouth daily.     EPINEPHrine (EPI-PEN) 0.3 mg/0.3 mL DEVI Inject 0.3 mLs (0.3 mg total) into the muscle once. 2 Device 1   hydrochlorothiazide (HYDRODIURIL) 25 MG tablet Take 1 tablet (25 mg total) daily by mouth. 90 tablet 2   dexlansoprazole (DEXILANT) 60 MG capsule Take 1 capsule (60 mg total) by mouth daily. 30 capsule 5   No current facility-administered medications for this visit.     Allergies as of 11/23/2018 - Review Complete 11/23/2018  Allergen Reaction Noted   Sulfur Swelling 07/17/2011   Ibuprofen Itching 09/22/2013   Penicillins Hives and Swelling 05/01/2012    Family History  Problem Relation Age of Onset   Hyperlipidemia Mother    Seizures Mother    Heart attack Maternal Grandfather        56   Colon cancer Neg Hx     Social History   Socioeconomic History   Marital status: Married    Spouse name: Not on file   Number of children: 2   Years of education: Not on file   Highest education level: Not on file  Occupational History   Occupation: Theatre manager: [employer] resource strain: Not on file   Food insecurity    Worry: Not on file    Inability: Not on file   Transportation needs    Medical: Not on file    Non-medical: Not on file  Tobacco Use   Smoking status: Never Smoker   Smokeless tobacco: Never Used  Substance and Sexual Activity   Alcohol use: Yes    Comment: wine 1 glass per week   Drug use: No   Sexual activity: Yes    Partners: Male  Lifestyle   Physical activity    Days per week: Not on  file    Minutes per session: Not on file   Stress: Not on file  Relationships   Social connections    Talks on phone: Not on file    Gets together: Not on file    Attends religious service: Not on file    Active member of club or organization: Not on file    Attends meetings of clubs or organizations: Not on file    Relationship status: Not on file  Other Topics Concern   Not on file  Social History Narrative   Lives w/ husband & 1 son, 1 grown daughter    Review of Systems: Gen: See HPI CV: See HPI Resp: Denies dyspnea or cough.  GI: See HPI Derm: Denies rash Psych: Denies depression, anxiety Heme: Denies bruising, bleeding  Physical Exam: BP 133/84    Pulse  69    Temp (!) 96.6 F (35.9 C) (Temporal)    Ht '4\' 11"'$  (1.499 m)    Wt 167 lb 12.8 oz (76.1 kg)    BMI 33.89 kg/m  General:   Alert and oriented. No distress noted. Pleasant and cooperative.  Head:  Normocephalic and atraumatic. Eyes:  Conjuctiva clear without scleral icterus. Mouth:  Oral mucosa pink and moist. No lesions in the oral cavity or pharynx. Heart:  S1, S2 present without murmurs appreciated. Lungs:  Clear to auscultation bilaterally. No wheezes, rales, or rhonchi. No distress.  Abdomen:  +BS, soft, non-tender and non-distended. No rebound or guarding. No HSM or masses noted. Msk:  Symmetrical without gross deformities. Normal posture. Extremities:  Without edema. Neurologic:  Alert and  oriented x4 Psych:  Normal mood and affect.  Labs: 07/29/2018 CMP: Glucose 103 (H), creatinine 0.72, sodium 140, potassium 3.6, calcium 10.1, albumin 4.6, total bilirubin 0.5, alk phos 68, AST 22, ALT 23 Lipid panel: Total cholesterol 144, HLD 39 (L), triglycerides 174 (H), LDL 78 Vitamin D 36 Hemoglobin A1c 5.8 (H)

## 2018-11-23 ENCOUNTER — Encounter: Payer: Self-pay | Admitting: Gastroenterology

## 2018-11-23 ENCOUNTER — Ambulatory Visit: Payer: Managed Care, Other (non HMO) | Admitting: Gastroenterology

## 2018-11-23 ENCOUNTER — Other Ambulatory Visit: Payer: Self-pay | Admitting: *Deleted

## 2018-11-23 ENCOUNTER — Other Ambulatory Visit: Payer: Self-pay

## 2018-11-23 VITALS — BP 133/84 | HR 69 | Temp 96.6°F | Ht 59.0 in | Wt 167.8 lb

## 2018-11-23 DIAGNOSIS — R0789 Other chest pain: Secondary | ICD-10-CM | POA: Diagnosis not present

## 2018-11-23 DIAGNOSIS — K581 Irritable bowel syndrome with constipation: Secondary | ICD-10-CM

## 2018-11-23 DIAGNOSIS — R1310 Dysphagia, unspecified: Secondary | ICD-10-CM

## 2018-11-23 DIAGNOSIS — K219 Gastro-esophageal reflux disease without esophagitis: Secondary | ICD-10-CM

## 2018-11-23 DIAGNOSIS — K760 Fatty (change of) liver, not elsewhere classified: Secondary | ICD-10-CM | POA: Diagnosis not present

## 2018-11-23 MED ORDER — DEXILANT 60 MG PO CPDR: 60.0000 mg | DELAYED_RELEASE_CAPSULE | Freq: Every day | ORAL | 5 refills | Status: DC

## 2018-11-23 NOTE — Patient Instructions (Addendum)
Stop omeprazole and start Dexilant 60 mg daily. I have sent this to your pharmacy.  Follow GERD diet. Avoid spicy, fatty, fried, greasy, and citrus foods. Avoid caffeine, carbonated beverages, and chocolate. Prop the head of your bed up on bricks or wood to create a 6 inch incline. Do not eat within 3 hours of laying down. See GERD handout below.   I am referring you to cardiology for evaluation of chest heaviness.   We will see you back in 2 months.   Aliene Altes, PA-C [facility]    Food Choices for Gastroesophageal Reflux Disease, Adult When you have gastroesophageal reflux disease (GERD), the foods you eat and your eating habits are very important. Choosing the right foods can help ease your discomfort. Think about working with a nutrition specialist (dietitian) to help you make good choices. What are tips for following this plan?  Meals  Choose healthy foods that are low in fat, such as fruits, vegetables, whole grains, low-fat dairy products, and lean meat, fish, and poultry.  Eat small meals often instead of 3 large meals a day. Eat your meals slowly, and in a place where you are relaxed. Avoid bending over or lying down until 2-3 hours after eating.  Avoid eating meals 2-3 hours before bed.  Avoid drinking a lot of liquid with meals.  Cook foods using methods other than frying. Bake, grill, or broil food instead.  Avoid or limit: ? Chocolate. ? Peppermint or spearmint. ? Alcohol. ? Pepper. ? Black and decaffeinated coffee. ? Black and decaffeinated tea. ? Bubbly (carbonated) soft drinks. ? Caffeinated energy drinks and soft drinks.  Limit high-fat foods such as: ? Fatty meat or fried foods. ? Whole milk, cream, butter, or ice cream. ? Nuts and nut butters. ? Pastries, donuts, and sweets made with butter or shortening.  Avoid foods that cause symptoms. These foods may be different for everyone. Common foods that cause symptoms  include: ? Tomatoes. ? Oranges, lemons, and limes. ? Peppers. ? Spicy food. ? Onions and garlic. ? Vinegar. Lifestyle  Maintain a healthy weight. Ask your doctor what weight is healthy for you. If you need to lose weight, work with your doctor to do so safely.  Exercise for at least 30 minutes for 5 or more days each week, or as told by your doctor.  Wear loose-fitting clothes.  Do not smoke. If you need help quitting, ask your doctor.  Sleep with the head of your bed higher than your feet. Use a wedge under the mattress or blocks under the bed frame to raise the head of the bed. Summary  When you have gastroesophageal reflux disease (GERD), food and lifestyle choices are very important in easing your symptoms.  Eat small meals often instead of 3 large meals a day. Eat your meals slowly, and in a place where you are relaxed.  Limit high-fat foods such as fatty meat or fried foods.  Avoid bending over or lying down until 2-3 hours after eating.  Avoid peppermint and spearmint, caffeine, alcohol, and chocolate. This information is not intended to replace advice given to you by your health care provider. Make sure you discuss any questions you have with your health care provider. Document Released: 06/25/2011 Document Revised: 04/16/2018 Document Reviewed: 01/30/2016 Elsevier Patient Education  2020 [employer].

## 2018-11-23 NOTE — Assessment & Plan Note (Addendum)
53 year old female with past medical history of GERD and peptic ulcer disease and 2011 who presents with worsening GERD symptoms over the last 4 weeks.  Symptoms include burning in her chest and throat.  Has been on omeprazole 40 mg daily.  Historically she had been on Prevacid and switch to Nexium in 2018 with good control of GERD.  Not sure why Nexium was switched to omeprazole. Recently omeprazole was increased to twice daily and she has had some improvement in severity of symptoms.  Still has burning to some degree daily.  Also with sensation of having to clear her throat frequently and mild dysphagia with "having to swallow an extra time" to get her food to go down.  Occurred a couple of times over the last 2-3 weeks.  Dysphagia typically to meats or breads. Denies odynophagia.  Denies abdominal pain, BRBPR, melena, nausea, vomiting, or unintentional weight loss.  No NSAIDs.  Avoiding spicy and fried foods.  Drinks soda once every 2 days.  Abdominal exam is benign.  No lesions in the mouth or pharynx.  Last EGD in 2011 with hypopharyngeal erosions of unclear significance, mild changes of reflux esophagitis, small sliding hiatal hernia, noncritical Schatzki ring, 3 mm gastric ulcer in the antrum with 2 scars and few erosions.  H. pylori serologies negative.  Suspect patient may be loosing response to omeprazole. She has known noncritical Schatzki's ring on EGD in 2011.  Mild dysphagia related to known Schatzki's ring versus development of stricture or esophageal web in the setting of uncontrolled GERD.  Will stop omeprazole and start Dexilant 60 mg daily.  Discussed GERD diet and lifestyle.  Handout provided. She will need EGD +/- dilation in the future. Due to reported chest heaviness with history of HTN, I will have her see cardiology prior to scheduling. Hopefully Dexilant will help dysphagia symptoms as well as they are mild.  Follow-up in 2 months.  Addendum: Patient saw cardiology and completed  Myoview. Per cardiology: Myoview low risk without significant ischemia to suggest obstructive CAD. Should be able to continue with plans for EGD.

## 2018-11-24 ENCOUNTER — Other Ambulatory Visit: Payer: Self-pay | Admitting: Gastroenterology

## 2018-11-24 ENCOUNTER — Telehealth: Payer: Self-pay

## 2018-11-24 DIAGNOSIS — K219 Gastro-esophageal reflux disease without esophagitis: Secondary | ICD-10-CM

## 2018-11-24 MED ORDER — DEXILANT 60 MG PO CPDR: 60.0000 mg | DELAYED_RELEASE_CAPSULE | Freq: Every day | ORAL | 5 refills | Status: DC

## 2018-11-24 NOTE — Assessment & Plan Note (Signed)
Addressed under GERD 

## 2018-11-24 NOTE — Assessment & Plan Note (Signed)
Constipation is much improved.  No longer requiring Linzess.  She has increased her vegetable and water intake and now has soft formed BMs daily.  Continue to monitor symptoms.

## 2018-11-24 NOTE — Telephone Encounter (Signed)
Rx sent to [facility]

## 2018-11-24 NOTE — Telephone Encounter (Signed)
LMOM that the Rx has been sent to [facility].

## 2018-11-24 NOTE — Telephone Encounter (Signed)
Pt called and said [name] has cancelled the prescription. I told her that I will let her know when [name] sends it to [facility].

## 2018-11-24 NOTE — Assessment & Plan Note (Signed)
Intermittent chest heaviness.  Reports this is different from the intermittent twinges of pain in her chest that seem to be more associated with reflux.  Occurred 2-3 times in the last couple of weeks.  Lasted for several minutes, no more than 5 minutes.  Not associated with exertion, typically occurs when sitting down.  No associated shortness of breath, diaphoresis, nausea, or heart palpitations.  She does have history of HTN.  Reports grandfather with MI at age 67.  Referred to cardiology for further evaluation.

## 2018-11-24 NOTE — Assessment & Plan Note (Signed)
LFTs within normal limits in July 2020.  No signs or symptoms of advanced liver disease.  Should continue to monitor LFTs every 6 months.  Advise she continue to work on diet, exercise, and weight loss.

## 2018-11-24 NOTE — Telephone Encounter (Signed)
Pt left Vm she would like to get the Dexilant from [facility]. I called [name] and left Vm for them to cancel the order. I have left Vm for pt on her cell [phone] that we will take care of this for her. [name], please send to [facility].  Thanks!

## 2018-12-18 ENCOUNTER — Telehealth: Payer: Self-pay | Admitting: Cardiology

## 2018-12-18 ENCOUNTER — Encounter: Payer: Self-pay | Admitting: Cardiology

## 2018-12-18 ENCOUNTER — Encounter: Payer: Self-pay | Admitting: *Deleted

## 2018-12-18 ENCOUNTER — Ambulatory Visit (INDEPENDENT_AMBULATORY_CARE_PROVIDER_SITE_OTHER): Payer: Managed Care, Other (non HMO) | Admitting: Cardiology

## 2018-12-18 ENCOUNTER — Other Ambulatory Visit: Payer: Self-pay

## 2018-12-18 VITALS — BP 129/86 | HR 73 | Ht 60.0 in | Wt 170.2 lb

## 2018-12-18 DIAGNOSIS — R0789 Other chest pain: Secondary | ICD-10-CM

## 2018-12-18 DIAGNOSIS — I10 Essential (primary) hypertension: Secondary | ICD-10-CM | POA: Diagnosis not present

## 2018-12-18 DIAGNOSIS — K219 Gastro-esophageal reflux disease without esophagitis: Secondary | ICD-10-CM

## 2018-12-18 NOTE — Telephone Encounter (Signed)
Pre-cert Verification for the following procedure    Lexiscan scheduled for 12-28-2018 at [facility].

## 2018-12-18 NOTE — Patient Instructions (Signed)
Your physician recommends that you schedule a follow-up appointment in: PENDING WITH DR MCDOWELL  Your physician recommends that you continue on your current medications as directed. Please refer to the Current Medication list given to you today.  Your physician has requested that you have a lexiscan myoview. For further information please visit [email or web address]. Please follow instruction sheet, as given.  Thank you for choosing [facility]!!       

## 2018-12-18 NOTE — Progress Notes (Signed)
Cardiology Office Note  Date: 12/18/2018   ID: Adriana Ramsey, DOB [birth year], MRN [id number]  PCP:  Iona Beard, MD  Consulting Cardiologist:  Rozann Lesches, MD Electrophysiologist:  None   Chief Complaint  Patient presents with  . Chest Pain     History of Present Illness: Adriana Ramsey is a 53 y.o. female referred for cardiology consultation by Ms. Adriana Mourning PA-C for the evaluation of chest pain.  She has a history of PUD and reflux, has been on various treatments over time (Prevacid, Nexium, omeprazole, most recently Dexilant).  She describes a burning sensation in her upper chest and neck corresponding mostly with her reflux, this has been better on Dexilant.  She has mild dysphagia and is being considered for EGD following GI evaluation.  Unrelated to these symptoms she sometimes feels a tenderness and pressure-like sensation, not exclusively associated with certain levels of activity, but has occurred at times with activity and resolves with rest.  Main cardiac risk factor is hypertension.  She has no history of type 2 diabetes mellitus, no specific premature CAD in her family (grandfather at age 56), LDL 5.  She has an intermediate pretest probability of CAD with atypical symptoms.  She had no incidental description of coronary artery calcification by previous chest CT imaging in 2018.  I personally reviewed her ECG today which shows sinus rhythm with nonspecific T wave changes.  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  Past Medical History:  Diagnosis Date  . Essential hypertension   . Fatty liver   . Gallbladder polyp 2012   Last ultrasound 2015, no evidence of gallbladder polyp  . GERD (gastroesophageal reflux disease)   . PUD (peptic ulcer disease) 2011  . Seasonal allergies     Past Surgical History:  Procedure Laterality Date  . BREAST REDUCTION SURGERY  2007  . CESAREAN SECTION     2  .  COLONOSCOPY N/A 03/14/2016   Procedure: COLONOSCOPY;  Surgeon: Danie Binder, MD;  [facility];  Service: Endoscopy;  Laterality: N/A;  12:15 pm  . ESOPHAGOGASTRODUODENOSCOPY  11/23/2009   NUR: hypopharyngeal erosions of unclear sidnificance/mild changes of reflux /small sliding hiatal hernia and non Schatzki ring/3-mm gastric ulcer along the antrum with 2 scars and few erosions. h.pyori serologies negative  . PARTIAL HYSTERECTOMY    . REDUCTION MAMMAPLASTY Bilateral     Current Outpatient Medications  Medication Sig Dispense Refill  . cholecalciferol (VITAMIN D) 1000 units tablet Take 1 tablet (1,000 Units total) by mouth daily.    Marland Kitchen dexlansoprazole (DEXILANT) 60 MG capsule Take 1 capsule (60 mg total) by mouth daily. 30 capsule 5  . EPINEPHrine (EPI-PEN) 0.3 mg/0.3 mL DEVI Inject 0.3 mLs (0.3 mg total) into the muscle once. 2 Device 1  . hydrochlorothiazide (HYDRODIURIL) 25 MG tablet Take 1 tablet (25 mg total) daily by mouth. 90 tablet 2   No current facility-administered medications for this visit.   Allergies:  Sulfur, Ibuprofen, and Penicillins   Social History: The patient  reports that she has never smoked. She has never used smokeless tobacco. She reports current alcohol use. She reports that she does not use drugs.   Family History: The patient's family history includes Heart attack in her maternal grandfather; Hyperlipidemia in her mother; Seizures in her mother.   ROS:  Please see the history of present illness. Otherwise, complete review of systems is positive for none.  All other systems are  reviewed and negative.   Physical Exam: VS:  BP 129/86   Pulse 73   Ht 5' (1.524 m)   Wt 170 lb 3.2 oz (77.2 kg)   SpO2 99%   BMI 33.24 kg/m , BMI Body mass index is 33.24 kg/m.  Wt Readings from Last 3 Encounters:  12/18/18 170 lb 3.2 oz (77.2 kg)  11/23/18 167 lb 12.8 oz (76.1 kg)  04/29/18 168 lb (76.2 kg)    General: Patient appears comfortable at rest. HEENT:  Conjunctiva and lids normal, wearing a mask. Neck: Supple, no elevated JVP or carotid bruits, no thyromegaly. Lungs: Clear to auscultation, nonlabored breathing at rest. Cardiac: Regular rate and rhythm, no S3 or significant systolic murmur. Abdomen: Soft, nontender, bowel sounds present. Extremities: No pitting edema, distal pulses 2+. Skin: Warm and dry. Musculoskeletal: No kyphosis. Neuropsychiatric: Alert and oriented x3, affect grossly appropriate.  ECG:  An ECG dated 11/19/2016 was personally reviewed today and demonstrated:  Sinus rhythm with decreased R wave progression.  Recent Labwork:  July 2020: Cholesterol 144, HDL 39, triglycerides 174, LDL 78, BUN 18, creatinine 0.72, potassium 3.6, AST 22, ALT 23 5.8%  Other Studies Reviewed Today:  No prior cardiac testing for review.  Assessment and Plan:  1.  Atypical chest pain in a 53 year old woman with hypertension.  She has intermediate pretest probability of CAD, ECG is nonspecific with T wave abnormalities.  Plan to proceed with a Lexiscan Myoview for further evaluation.  2.  History of PUD and GERD.  She is tolerating Dexilant at this time.  She does have mild dysphagia and there is pending plan for EGD.  3.  Essential hypertension, currently on HCTZ.  We also discussed salt restriction.  Medication Adjustments/Labs and Tests Ordered: Current medicines are reviewed at length with the patient today.  Concerns regarding medicines are outlined above.   Tests Ordered: Orders Placed This Encounter  Procedures  . NM Myocar Multi W/Spect W/Wall Motion / EF  . EKG 12-Lead    Medication Changes: No orders of the defined types were placed in this encounter.   Disposition:  Follow up test results.  Signed, Jonelle Sidle, MD, [facility] 12/18/2018 1:47 PM    [facility] [address] [place], [place], [place] [address] Phone: [phone]; Fax: [phone]

## 2018-12-28 ENCOUNTER — Other Ambulatory Visit: Payer: Self-pay

## 2018-12-28 ENCOUNTER — Ambulatory Visit (HOSPITAL_COMMUNITY)
Admission: RE | Admit: 2018-12-28 | Discharge: 2018-12-28 | Disposition: A | Discharge status: Home / Self Care | Payer: Managed Care, Other (non HMO) | Source: Ambulatory Visit | Attending: Cardiology | Admitting: Cardiology

## 2018-12-28 ENCOUNTER — Encounter (HOSPITAL_COMMUNITY): Payer: Managed Care, Other (non HMO)

## 2018-12-28 DIAGNOSIS — R0789 Other chest pain: Secondary | ICD-10-CM | POA: Diagnosis not present

## 2018-12-28 LAB — NM MYOCAR MULTI W/SPECT W/WALL MOTION / EF
LV dias vol: 64 mL (ref 46–106)
LV sys vol: 27 mL
Peak HR: 115 {beats}/min
RATE: 0.63
Rest HR: 66 {beats}/min
SDS: 0
SRS: 4
SSS: 4
TID: 1.24

## 2018-12-28 MED ORDER — TECHNETIUM TC 99M TETROFOSMIN IV KIT
30.0000 | PACK | Freq: Once | INTRAVENOUS | Status: AC | PRN
  Administered 2018-12-28: 30 via INTRAVENOUS

## 2018-12-28 MED ORDER — SODIUM CHLORIDE FLUSH 0.9 % IV SOLN
INTRAVENOUS | Status: AC
  Administered 2018-12-28: 10 mL via INTRAVENOUS
  Filled 2018-12-28: qty 10

## 2018-12-28 MED ORDER — REGADENOSON 0.4 MG/5ML IV SOLN
INTRAVENOUS | Status: AC
  Administered 2018-12-28: 11:00:00 0.4 mg via INTRAVENOUS
  Filled 2018-12-28: qty 5

## 2018-12-28 MED ORDER — TECHNETIUM TC 99M TETROFOSMIN IV KIT
10.0000 | PACK | Freq: Once | INTRAVENOUS | Status: AC | PRN
  Administered 2018-12-28: 10:00:00 9.87 via INTRAVENOUS

## 2018-12-29 ENCOUNTER — Telehealth: Payer: Self-pay | Admitting: Gastroenterology

## 2018-12-29 DIAGNOSIS — R1310 Dysphagia, unspecified: Secondary | ICD-10-CM

## 2018-12-29 NOTE — Telephone Encounter (Signed)
Tried to call pt, call went straight to VM, LMOVM for return call. 

## 2018-12-29 NOTE — Telephone Encounter (Signed)
Called pt, EGD/-/+DIL w/SLF scheduled for 04/21/19 at 1:15pm. COVID test 04/20/19 at 10:00am. Pt aware to quarantine at home after the test until procedure. OV rescheduled to 04/26/19. Appt letters mailed with procedure instructions.

## 2018-12-29 NOTE — Telephone Encounter (Signed)
Saw patient on 11/23/18. Discussed scheduling EGD +/- dilation for dysphagia. She was to see cardiology prior to scheduling. Patient saw cardiology and completed Myoview. Per cardiology: Myoview low risk without significant ischemia to suggest obstructive CAD. Should be able to continue with plans for EGD.   [facility] Clinical Pool: If patient is ok with moving forward with scheduling, please arrange EGD +/- dilation with Dr. Oneida Alar. Dx: Dysphagia.   We will need to adjust follow-up appointment to after EGD.

## 2018-12-29 NOTE — Telephone Encounter (Signed)
Tried to call pt, call went straight to VM, LMOVM for return call to reschedule OV.

## 2018-12-29 NOTE — Telephone Encounter (Signed)
Pt has requested earlier for instructions to be mailed to [place]; [place] [address]. She didn't want addressed changed in chart at this time.

## 2018-12-29 NOTE — Telephone Encounter (Signed)
Noted. She is going to need to be seen in office prior to procedure since this is so far out. We can see her in February or March unless she needs to be seen earlier.   Also, she should be aware, if she has food that gets hung in her throat that will not come up or go down, she should proceed to the emergency room.

## 2019-01-04 NOTE — Telephone Encounter (Signed)
Tried to call pt, no answer, LMOVM for return call to reschedule OV.

## 2019-01-05 NOTE — Telephone Encounter (Signed)
OV rescheduled to 03/22/19 at 8:30am. Appt letter mailed.

## 2019-01-25 ENCOUNTER — Ambulatory Visit: Payer: Managed Care, Other (non HMO) | Admitting: Gastroenterology

## 2019-03-20 NOTE — Progress Notes (Deleted)
Referring Provider: Iona Beard, MD Primary Care Physician:  Iona Beard, MD Primary GI Physician: Dr. Oneida Alar  No chief complaint on file.   HPI:   Adriana Ramsey is a 54 y.o. female with history of fatty liver with minimally elevated AST/ALT, viral markers for hepatitis B and C negative, GERD, IBS constipation type, and PUD in 2011.  Colonoscopy up-to-date in March 2018, due for repeat in 2028. She is presenting today for follow-up prior to EGD/ED scheduled for 04/21/19.   Last seen in our office on 11/23/2018 for GERD, dysphagia, IBS, fatty liver, and chest heaviness.  She reported 4 weeks of burning in her throat and chest.  Increased omeprazole to twice daily which helped some, but continued with daily symptoms.  Was having to swallow an extra time to get food to go down x2-3 weeks.  IBS with constipation was much improved after increasing vegetable and water intake.  No longer taking Linzess.  No signs or symptoms of advanced liver disease.  LFTs had normalized in July 2020.  Plans at that time included switching omeprazole to Dexilant, recommended monitoring LFTs every 6 months, continue working on diet, exercise, and weight loss, monitor for return of constipation. We would need to pursue EGD/ED but due to report of chest heaviness, she was referred to cardiology prior to scheduling EGD.   Patient saw cardiology and completed Myoview on 12/28/18.  Per cardiology: Myoview was low risk without significant ischemia to suggest obstructive CAD.  Should be able to continue with plans for EGD.  Today:  Dysphagia:   GERD:   IBS with constipation:   Elevated LFTs/Fatty Liver:   Past Medical History:  Diagnosis Date  . Essential hypertension   . Fatty liver   . Gallbladder polyp 2012   Last ultrasound 2015, no evidence of gallbladder polyp  . GERD (gastroesophageal reflux disease)   . PUD (peptic ulcer disease) 2011  . Seasonal allergies     Past Surgical History:    Procedure Laterality Date  . BREAST REDUCTION SURGERY  2007  . CESAREAN SECTION     2  . COLONOSCOPY N/A 03/14/2016   Procedure: COLONOSCOPY;  Surgeon: Danie Binder, MD;  Location: [facility];  Service: Endoscopy;  Laterality: N/A;  12:15 pm  . ESOPHAGOGASTRODUODENOSCOPY  11/23/2009   NUR: hypopharyngeal erosions of unclear sidnificance/mild changes of reflux /small sliding hiatal hernia and non Schatzki ring/3-mm gastric ulcer along the antrum with 2 scars and few erosions. h.pyori serologies negative  . PARTIAL HYSTERECTOMY    . REDUCTION MAMMAPLASTY Bilateral     Current Outpatient Medications  Medication Sig Dispense Refill  . cholecalciferol (VITAMIN D) 1000 units tablet Take 1 tablet (1,000 Units total) by mouth daily.    Marland Kitchen dexlansoprazole (DEXILANT) 60 MG capsule Take 1 capsule (60 mg total) by mouth daily. 30 capsule 5  . EPINEPHrine (EPI-PEN) 0.3 mg/0.3 mL DEVI Inject 0.3 mLs (0.3 mg total) into the muscle once. 2 Device 1  . hydrochlorothiazide (HYDRODIURIL) 25 MG tablet Take 1 tablet (25 mg total) daily by mouth. 90 tablet 2   No current facility-administered medications for this visit.    Allergies as of 03/22/2019 - Review Complete 12/18/2018  Allergen Reaction Noted  . Sulfur Swelling 07/17/2011  . Ibuprofen Itching 09/22/2013  . Penicillins Hives and Swelling 05/01/2012    Family History  Problem Relation Age of Onset  . Hyperlipidemia Mother   . Seizures Mother   . Heart attack Maternal Grandfather  72  . Colon cancer Neg Hx     Social History   Socioeconomic History  . Marital status: Married    Spouse name: Not on file  . Number of children: 2  . Years of education: Not on file  . Highest education level: Not on file  Occupational History  . Occupation: Immunologist    Employer: [employer]  Tobacco Use  . Smoking status: Never Smoker  . Smokeless tobacco: Never Used  Substance and Sexual Activity  . Alcohol use: Yes     Comment: wine 1 glass per week  . Drug use: No  . Sexual activity: Not on file  Other Topics Concern  . Not on file  Social History Narrative   Lives w/ husband & 1 son, 1 grown daughter   Social Determinants of Health   Financial Resource Strain:   . Difficulty of Paying Living Expenses:   Food Insecurity:   . Worried About Programme researcher, broadcasting/film/video in the Last Year:   . Barista in the Last Year:   Transportation Needs:   . Freight forwarder (Medical):   Marland Kitchen Lack of Transportation (Non-Medical):   Physical Activity:   . Days of Exercise per Week:   . Minutes of Exercise per Session:   Stress:   . Feeling of Stress :   Social Connections:   . Frequency of Communication with Friends and Family:   . Frequency of Social Gatherings with Friends and Family:   . Attends Religious Services:   . Active Member of Clubs or Organizations:   . Attends Banker Meetings:   Marland Kitchen Marital Status:     Review of Systems: Gen: Denies fever, chills, anorexia. Denies fatigue, weakness, weight loss.  CV: Denies chest pain, palpitations, syncope, peripheral edema, and claudication. Resp: Denies dyspnea at rest, cough, wheezing, coughing up blood, and pleurisy. GI: Denies vomiting blood, jaundice, and fecal incontinence.   Denies dysphagia or odynophagia. Derm: Denies rash, itching, dry skin Psych: Denies depression, anxiety, memory loss, confusion. No homicidal or suicidal ideation.  Heme: Denies bruising, bleeding, and enlarged lymph nodes.  Physical Exam: There were no vitals taken for this visit. General:   Alert and oriented. No distress noted. Pleasant and cooperative.  Head:  Normocephalic and atraumatic. Eyes:  Conjuctiva clear without scleral icterus. Mouth:  Oral mucosa pink and moist. Good dentition. No lesions. Heart:  S1, S2 present without murmurs appreciated. Lungs:  Clear to auscultation bilaterally. No wheezes, rales, or rhonchi. No distress.  Abdomen:   +BS, soft, non-tender and non-distended. No rebound or guarding. No HSM or masses noted. Msk:  Symmetrical without gross deformities. Normal posture. Extremities:  Without edema. Neurologic:  Alert and  oriented x4 Psych:  Alert and cooperative. Normal mood and affect.

## 2019-03-22 ENCOUNTER — Telehealth: Payer: Self-pay | Admitting: Gastroenterology

## 2019-03-22 ENCOUNTER — Ambulatory Visit: Payer: Managed Care, Other (non HMO) | Admitting: Gastroenterology

## 2019-03-22 ENCOUNTER — Encounter: Payer: Self-pay | Admitting: Gastroenterology

## 2019-03-22 NOTE — Telephone Encounter (Signed)
Patient was a no show and letter sent  °

## 2019-03-22 NOTE — Telephone Encounter (Signed)
Noted. Patient was scheduled just for follow-up prior to procedure as last OV was in November. Will discuss with Dr. Darrick Penna as to whether she needs OV prior to procedure.   Dr. Darrick Penna, can you weigh in on this?  Patient saw cardiology to rule out cardiac cause of chest heaviness prior to EGD/ED. Myoview on 12/28/18 without any significant ischemia to suggest obstructive CAD. First available procedure was in April so I had advised for OVF since last OV was in November 2020. Can she proceed with EGD as scheduled or does she need OV?

## 2019-03-23 NOTE — Telephone Encounter (Signed)
Pt is already scheduled for procedure 04/21/19.

## 2019-03-23 NOTE — Telephone Encounter (Signed)
TRIAGE FOR EGD/?DIL WITH MODERATE SEDATION.

## 2019-04-20 ENCOUNTER — Other Ambulatory Visit: Payer: Self-pay

## 2019-04-20 ENCOUNTER — Other Ambulatory Visit (HOSPITAL_COMMUNITY)
Admission: RE | Admit: 2019-04-20 | Discharge: 2019-04-20 | Disposition: A | Discharge status: Home / Self Care | Payer: Managed Care, Other (non HMO) | Source: Ambulatory Visit | Attending: Gastroenterology | Admitting: Gastroenterology

## 2019-04-20 ENCOUNTER — Other Ambulatory Visit (HOSPITAL_COMMUNITY): Payer: Managed Care, Other (non HMO)

## 2019-04-20 DIAGNOSIS — Z01812 Encounter for preprocedural laboratory examination: Secondary | ICD-10-CM | POA: Diagnosis present

## 2019-04-20 DIAGNOSIS — Z20822 Contact with and (suspected) exposure to covid-19: Secondary | ICD-10-CM | POA: Diagnosis not present

## 2019-04-21 ENCOUNTER — Encounter (HOSPITAL_COMMUNITY)
Admission: RE | Disposition: A | Discharge status: Home / Self Care | Payer: Self-pay | Source: Home / Self Care | Attending: Gastroenterology

## 2019-04-21 ENCOUNTER — Encounter (HOSPITAL_COMMUNITY): Payer: Self-pay | Admitting: Gastroenterology

## 2019-04-21 ENCOUNTER — Other Ambulatory Visit: Payer: Self-pay

## 2019-04-21 ENCOUNTER — Ambulatory Visit (HOSPITAL_COMMUNITY)
Admission: RE | Admit: 2019-04-21 | Discharge: 2019-04-21 | Disposition: A | Discharge status: Home / Self Care | Payer: Managed Care, Other (non HMO) | Attending: Gastroenterology | Admitting: Gastroenterology

## 2019-04-21 DIAGNOSIS — I10 Essential (primary) hypertension: Secondary | ICD-10-CM | POA: Diagnosis not present

## 2019-04-21 DIAGNOSIS — Z79899 Other long term (current) drug therapy: Secondary | ICD-10-CM | POA: Insufficient documentation

## 2019-04-21 DIAGNOSIS — K449 Diaphragmatic hernia without obstruction or gangrene: Secondary | ICD-10-CM | POA: Insufficient documentation

## 2019-04-21 DIAGNOSIS — R1310 Dysphagia, unspecified: Secondary | ICD-10-CM | POA: Insufficient documentation

## 2019-04-21 DIAGNOSIS — Z8249 Family history of ischemic heart disease and other diseases of the circulatory system: Secondary | ICD-10-CM | POA: Insufficient documentation

## 2019-04-21 DIAGNOSIS — K760 Fatty (change of) liver, not elsewhere classified: Secondary | ICD-10-CM | POA: Diagnosis not present

## 2019-04-21 DIAGNOSIS — K222 Esophageal obstruction: Secondary | ICD-10-CM | POA: Insufficient documentation

## 2019-04-21 DIAGNOSIS — K219 Gastro-esophageal reflux disease without esophagitis: Secondary | ICD-10-CM | POA: Insufficient documentation

## 2019-04-21 HISTORY — PX: SAVORY DILATION: SHX5439

## 2019-04-21 HISTORY — PX: ESOPHAGOGASTRODUODENOSCOPY: SHX5428

## 2019-04-21 LAB — SARS CORONAVIRUS 2 (TAT 6-24 HRS): SARS Coronavirus 2: NEGATIVE

## 2019-04-21 SURGERY — EGD (ESOPHAGOGASTRODUODENOSCOPY)
Anesthesia: Moderate Sedation

## 2019-04-21 MED ORDER — MEPERIDINE HCL 100 MG/ML IJ SOLN
INTRAMUSCULAR | Status: DC | PRN
  Administered 2019-04-21 (×2): 25 mg via INTRAVENOUS

## 2019-04-21 MED ORDER — LIDOCAINE VISCOUS HCL 2 % MT SOLN
OROMUCOSAL | Status: DC | PRN
  Administered 2019-04-21: 1 via OROMUCOSAL

## 2019-04-21 MED ORDER — STERILE WATER FOR IRRIGATION IR SOLN
Status: DC | PRN
  Administered 2019-04-21: 13:00:00 2.5 mL

## 2019-04-21 MED ORDER — MEPERIDINE HCL 100 MG/ML IJ SOLN
INTRAMUSCULAR | Status: AC
  Filled 2019-04-21: qty 2

## 2019-04-21 MED ORDER — MINERAL OIL PO OIL
TOPICAL_OIL | ORAL | Status: AC
  Filled 2019-04-21: qty 30

## 2019-04-21 MED ORDER — LIDOCAINE VISCOUS HCL 2 % MT SOLN
OROMUCOSAL | Status: AC
  Filled 2019-04-21: qty 15

## 2019-04-21 MED ORDER — MIDAZOLAM HCL 5 MG/5ML IJ SOLN
INTRAMUSCULAR | Status: DC | PRN
  Administered 2019-04-21 (×2): 2 mg via INTRAVENOUS
  Administered 2019-04-21: 1 mg via INTRAVENOUS

## 2019-04-21 MED ORDER — SODIUM CHLORIDE 0.9 % IV SOLN
INTRAVENOUS | Status: DC
  Administered 2019-04-21: 1000 mL via INTRAVENOUS

## 2019-04-21 MED ORDER — MIDAZOLAM HCL 5 MG/5ML IJ SOLN
INTRAMUSCULAR | Status: AC
  Filled 2019-04-21: qty 10

## 2019-04-21 NOTE — H&P (Signed)
Primary Care Physician:  Mirna Mires, MD Primary Gastroenterologist:  Dr. Darrick Penna  Pre-Procedure History & Physical: HPI:  Adriana Ramsey is a 54 y.o. female here for [facility].  Past Medical History:  Diagnosis Date  . Essential hypertension   . Fatty liver   . Gallbladder polyp 2012   Last ultrasound 2015, no evidence of gallbladder polyp  . GERD (gastroesophageal reflux disease)   . PUD (peptic ulcer disease) 2011  . Seasonal allergies     Past Surgical History:  Procedure Laterality Date  . BREAST REDUCTION SURGERY  2007  . CESAREAN SECTION     2  . COLONOSCOPY N/A 03/14/2016   Procedure: COLONOSCOPY;  Surgeon: West Bali, MD;  Location: [facility];  Service: Endoscopy;  Laterality: N/A;  12:15 pm  . ESOPHAGOGASTRODUODENOSCOPY  11/23/2009   NUR: hypopharyngeal erosions of unclear sidnificance/mild changes of reflux /small sliding hiatal hernia and non Schatzki ring/3-mm gastric ulcer along the antrum with 2 scars and few erosions. h.pyori serologies negative  . PARTIAL HYSTERECTOMY    . REDUCTION MAMMAPLASTY Bilateral     Prior to Admission medications   Medication Sig Start Date End Date Taking? Authorizing Provider  atorvastatin (LIPITOR) 10 MG tablet Take 10 mg by mouth daily.   Yes [provider]  cholecalciferol (VITAMIN D) 1000 units tablet Take 1 tablet (1,000 Units total) by mouth daily. 03/25/17  Yes Crawford, Velna Hatchet, MD  hydrochlorothiazide (HYDRODIURIL) 25 MG tablet Take 1 tablet (25 mg total) daily by mouth. 11/19/16  Yes Dawson, Velna Hatchet, MD  omeprazole (PRILOSEC) 40 MG capsule Take 40 mg by mouth daily.   Yes [provider]  EPINEPHrine (EPI-PEN) 0.3 mg/0.3 mL DEVI Inject 0.3 mLs (0.3 mg total) into the muscle once. 10/11/11   Salley Scarlet, MD    Allergies as of 12/29/2018 - Review Complete 12/18/2018  Allergen Reaction Noted  . Sulfur Swelling 07/17/2011  . Ibuprofen Itching 09/22/2013  . Penicillins Hives  and Swelling 05/01/2012    Family History  Problem Relation Age of Onset  . Hyperlipidemia Mother   . Seizures Mother   . Heart attack Maternal Grandfather        72  . Colon cancer Neg Hx     Social History   Socioeconomic History  . Marital status: Married    Spouse name: Not on file  . Number of children: 2  . Years of education: Not on file  . Highest education level: Not on file  Occupational History  . Occupation: Immunologist    Employer: [employer]  Tobacco Use  . Smoking status: Never Smoker  . Smokeless tobacco: Never Used  Substance and Sexual Activity  . Alcohol use: Yes    Comment: wine 1 glass per week  . Drug use: No  . Sexual activity: Not on file  Other Topics Concern  . Not on file  Social History Narrative   Lives w/ husband & 1 son, 1 grown daughter   Social Determinants of Health   Financial Resource Strain:   . Difficulty of Paying Living Expenses:   Food Insecurity:   . Worried About Programme researcher, broadcasting/film/video in the Last Year:   . Barista in the Last Year:   Transportation Needs:   . Freight forwarder (Medical):   Marland Kitchen Lack of Transportation (Non-Medical):   Physical Activity:   . Days of Exercise per Week:   . Minutes of Exercise per Session:  Stress:   . Feeling of Stress :   Social Connections:   . Frequency of Communication with Friends and Family:   . Frequency of Social Gatherings with Friends and Family:   . Attends Religious Services:   . Active Member of Clubs or Organizations:   . Attends Archivist Meetings:   Marland Kitchen Marital Status:   Intimate Partner Violence:   . Fear of Current or Ex-Partner:   . Emotionally Abused:   Marland Kitchen Physically Abused:   . Sexually Abused:     Review of Systems: See HPI, otherwise negative ROS   Physical Exam: BP (!) 139/101   Pulse 76   Temp 98.3 F (36.8 C) (Oral)   Resp 14   Ht 5' (1.524 m)   Wt 63.5 kg   SpO2 99%   BMI 27.34 kg/m  General:   Alert,   pleasant and cooperative in NAD Head:  Normocephalic and atraumatic. Neck:  Supple; Lungs:  Clear throughout to auscultation.    Heart:  Regular rate and rhythm. Abdomen:  Soft, nontender and nondistended. Normal bowel sounds, without guarding, and without rebound.   Neurologic:  Alert and  oriented x4;  grossly normal neurologically.  Impression/Plan:    DYSPHAGIA/dyspepsia  PLAN:  EGD/DIL TODAY. DISCUSSED PROCEDURE, BENEFITS, & RISKS: < 1% chance of medication reaction, bleeding, perforation, or ASPIRATION.

## 2019-04-21 NOTE — Discharge Instructions (Signed)
I STRETCHED your esophagus. YOU HAD A RING AT THE BASE OF YOUR ESOPHAGUS DUE TO UNCONTROLLED ACID REFLUX. YOU HAVE A SMALLHIATAL HERNIA.   ALL OF THE FOLLOWING THINGS WILL MAKE YOUR SWALLOWING AND HEARTBURN BETTER:   1. DRINK WATER TO KEEP YOUR URINE LIGHT YELLOW.  2. AVOID REFLUX TRIGGERS. SEE INFO BELOW.  3. FOLLOW A LOW FAT DIET. MEATS SHOULD BE BAKED, BROILED, OR BOILED. AVOID FAST AND FRIED FOODS.  4. CONTINUE OMEPRAZOLE.  TAKE 30 MINUTES PRIOR TO YOUR FIRST MEAL EVERY DAY.  5. USE PEPCID OR TAGAMET WHEN NEEDED TO CONTROL REFLUX/HEARTBURN.   FOLLOW UP APPT IN 4 MONTHS.     UPPER ENDOSCOPY AFTER CARE Read the instructions outlined below and refer to this sheet in the next week. These discharge instructions provide you with general information on caring for yourself after you leave the hospital. While your treatment has been planned according to the most current medical practices available, unavoidable complications occasionally occur. If you have any problems or questions after discharge, call DR. Donicia Druck, [phone].  ACTIVITY  You may resume your regular activity, but move at a slower pace for the next 24 hours.   Take frequent rest periods for the next 24 hours.   Walking will help get rid of the air and reduce the bloated feeling in your belly (abdomen).   No driving for 24 hours (because of the medicine (anesthesia) used during the test).   You may shower.   Do not sign any important legal documents or operate any machinery for 24 hours (because of the anesthesia used during the test).    NUTRITION  Drink plenty of fluids.   You may resume your normal diet as instructed by your doctor.   Begin with a light meal and progress to your normal diet. Heavy or fried foods are harder to digest and may make you feel sick to your stomach (nauseated).   Avoid alcoholic beverages for 24 hours or as instructed.    MEDICATIONS  You may resume your normal  medications.   WHAT YOU CAN EXPECT TODAY  Some feelings of bloating in the abdomen.   Passage of more gas than usual.    IF YOU HAD A BIOPSY TAKEN DURING THE UPPER ENDOSCOPY:  Eat a soft diet IF YOU HAVE NAUSEA, BLOATING, ABDOMINAL PAIN, OR VOMITING.    FINDING OUT THE RESULTS OF YOUR TEST Not all test results are available during your visit. DR. Darrick Penna WILL CALL YOU WITHIN 7 DAYS OF YOUR PROCEDUE WITH YOUR RESULTS. Do not assume everything is normal if you have not heard from DR. Uriah Trueba IN ONE WEEK, CALL HER OFFICE AT [phone].  SEEK IMMEDIATE MEDICAL ATTENTION AND CALL THE OFFICE: [phone] IF:  You have more than a spotting of blood in your stool.   Your belly is swollen (abdominal distention).   You are nauseated or vomiting.   You have a temperature over 101F.   You have abdominal pain or discomfort that is severe or gets worse throughout the day.   Lifestyle and home remedies TO CONTROL HEARTBURN/REFLUX  You may eliminate or reduce the frequency of heartburn by making the following lifestyle changes:   Control your weight. Being overweight is a major risk factor for heartburn and GERD. Excess pounds put pressure on your abdomen, pushing up your stomach and causing acid to back up into your esophagus.    Eat smaller meals. 4 TO 6 MEALS A DAY. This reduces pressure on the lower esophageal sphincter,  helping to prevent the valve from opening and acid from washing back into your esophagus.    Loosen your belt. Clothes that fit tightly around your waist put pressure on your abdomen and the lower esophageal sphincter.    Eliminate heartburn triggers. Everyone has specific triggers. Common triggers such as fatty or fried foods, spicy food, tomato sauce, carbonated beverages, alcohol, chocolate, mint, garlic, onion, caffeine and nicotine may make heartburn worse.    Avoid stooping or bending. Tying your shoes is OK. Bending over for longer periods to weed your  garden isn't, especially soon after eating.    Don't lie down after a meal. Wait at least three to four hours after eating before going to bed, and don't lie down right after eating.   Alternative medicine  Several home remedies exist for treating GERD, but they provide only temporary relief. They include drinking baking soda (sodium bicarbonate) added to water or drinking other fluids such as baking soda mixed with cream of tartar and water.  Although these liquids create temporary relief by neutralizing, washing away or buffering acids, eventually they aggravate the situation by adding gas and fluid to your stomach, increasing pressure and causing more acid reflux. Further, adding more sodium to your diet may increase your blood pressure and add stress to your heart, and excessive bicarbonate ingestion can alter the acid-base balance in your body.   ESOPHAGEAL RING Esophageal RINGS can be caused by stomach acid backing up into the tube that carries food from the mouth down to the stomach (lower esophagus).  TREATMENT There are a number of medicines used to treat reflux/stricture, including: Antacids.  PEPCID/TAGAMET Proton-pump inhibitors: OMEPRAZOLE   Hiatal Hernia A hiatal hernia occurs when a part of the stomach slides above the diaphragm. The diaphragm is the thin muscle separating the belly (abdomen) from the chest. A hiatal hernia can be something you are born with or develop over time. Hiatal hernias may allow stomach acid to flow back into your esophagus, the tube which carries food from your mouth to your stomach. If this acid causes problems it is called GERD (gastro-esophageal reflux disease).

## 2019-04-21 NOTE — Op Note (Signed)
[facility] Patient Name: Adriana Ramsey Procedure Date: 04/21/2019 12:48 PM MRN: [id number] Date of Birth: [birth year] Attending MD: Barney Drain MD, MD CSN: [id number] Age: 54 Admit Type: Outpatient Procedure:                Upper GI endoscopy WITH ESOPHAGEAL DILATION Indications:              Dysphagia. GERD Sx 2-3 TIMES A DAY. USING                            OMEPRAZOLE PRN. TRYING TO AVOID TRIGGERS. Providers:                Barney Drain MD, MD, Lurline Del, RN, Nelma Rothman,                            Technician Referring MD:             Barrie Folk. Hill MD, MD Medicines:                Meperidine 50 mg IV, Midazolam 5 mg IV Complications:            No immediate complications. Estimated Blood Loss:     Estimated blood loss was minimal. Procedure:                Pre-Anesthesia Assessment:                           - Prior to the procedure, a History and Physical                            was performed, and patient medications and                            allergies were reviewed. The patient's tolerance of                            previous anesthesia was also reviewed. The risks                            and benefits of the procedure and the sedation                            options and risks were discussed with the patient.                            All questions were answered, and informed consent                            was obtained. Prior Anticoagulants: The patient has                            taken no previous anticoagulant or antiplatelet                            agents. ASA Grade Assessment: II - A patient with  mild systemic disease. After reviewing the risks                            and benefits, the patient was deemed in                            satisfactory condition to undergo the procedure.                            After obtaining informed consent, the endoscope was                            passed under direct  vision. Throughout the                            procedure, the patient's blood pressure, pulse, and                            oxygen saturations were monitored continuously. The                            [id number]-H190 ([id number]) scope was introduced through the                            mouth, and advanced to the second part of duodenum.                            The upper GI endoscopy was accomplished without                            difficulty. The patient tolerated the procedure                            well. Scope In: 1:00:43 PM Scope Out: 1:08:47 PM Total Procedure Duration: 0 hours 8 minutes 4 seconds  Findings:      A moderate Schatzki ring was found at the gastroesophageal junction. A       guidewire was placed and the scope was withdrawn. Dilation was performed       with a Savary dilator with mild resistance at 15 mm, 16 mm and 17 mm.       Estimated blood loss was minimal.      A small hiatal hernia was present.      The examined duodenum was normal. Impression:               - DYSPHAGIA 2o TO Moderate Schatzki ring DUE TO                            UNCONTROLLED REFLUX. Dilated.                           - Small hiatal hernia. Moderate Sedation:      Moderate (conscious) sedation was administered by the endoscopy nurse       and supervised by the endoscopist. The following parameters were  monitored: oxygen saturation, heart rate, blood pressure, and response       to care. Total physician intraservice time was 16 minutes. Recommendation:           - Patient has a contact number available for                            emergencies. The signs and symptoms of potential                            delayed complications were discussed with the                            patient. Return to normal activities tomorrow.                            Written discharge instructions were provided to the                            patient.                           - Low fat diet.  AVOID REFLUX TRIGGERS.                           - Continue present medications. OMEPRAZOLE 30 MONS                            PRIOR TO FIRST MEAL DAILY FOREVER.                           - Return to GI clinic in 4 months. Procedure Code(s):        --- Professional ---                           [phone], Esophagogastroduodenoscopy, flexible,                            transoral; with insertion of guide wire followed by                            passage of dilator(s) through esophagus over guide                            wire                           G0500, Moderate sedation services provided by the                            same physician or other qualified health care                            professional performing a gastrointestinal  endoscopic service that sedation supports,                            requiring the presence of an independent trained                            observer to assist in the monitoring of the                            patient's level of consciousness and physiological                            status; initial 15 minutes of intra-service time;                            patient age 31 years or older (additional time may                            be reported with 16073, as appropriate) Diagnosis Code(s):        --- Professional ---                           K22.2, Esophageal obstruction                           K44.9, Diaphragmatic hernia without obstruction or                            gangrene                           R13.10, Dysphagia, unspecified CPT copyright 2019 American Medical Association. All rights reserved. The codes documented in this report are preliminary and upon coder review may  be revised to meet current compliance requirements. Jonette Eva, MD Jonette Eva MD, MD 04/21/2019 1:34:27 PM This report has been signed electronically. Number of Addenda: 0

## 2019-04-22 ENCOUNTER — Telehealth: Payer: Self-pay | Admitting: Emergency Medicine

## 2019-04-22 ENCOUNTER — Encounter: Payer: Self-pay | Admitting: Emergency Medicine

## 2019-04-22 ENCOUNTER — Telehealth: Payer: Self-pay | Admitting: Gastroenterology

## 2019-04-22 NOTE — Telephone Encounter (Signed)
OK TO RETURN TO WORK APR 16.

## 2019-04-22 NOTE — Telephone Encounter (Signed)
Note given to pt returning to work on 04/24/19

## 2019-04-22 NOTE — Telephone Encounter (Signed)
Pt had EGD done by [facility] yesterday and her throat is sore and voice is raspy. She said she works in [employer] and talks on the phone most of the day. 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 She said she could come by today to pick up. Please advise. [phone]

## 2019-04-22 NOTE — Telephone Encounter (Signed)
Pt called again and requested she be excused  from work until 4/19. Is this ok?

## 2019-04-22 NOTE — Telephone Encounter (Signed)
Is it ok if I provide her with a work note for tomorrow?

## 2019-04-22 NOTE — Telephone Encounter (Signed)
Called lmom

## 2019-04-22 NOTE — Telephone Encounter (Signed)
RETURN TO WORK APR 17. 

## 2019-04-22 NOTE — Telephone Encounter (Signed)
Pt states she wants to stay out of work on 4/16 and return to work on 4/17. Is this ok? Due to her sore raspy throat and she talks a lot through the day

## 2019-04-22 NOTE — Telephone Encounter (Signed)
RETURN TO WORK APR 17.

## 2019-04-26 ENCOUNTER — Ambulatory Visit: Payer: Managed Care, Other (non HMO) | Admitting: Gastroenterology

## 2019-04-27 ENCOUNTER — Telehealth: Payer: Self-pay | Admitting: Emergency Medicine

## 2019-04-27 NOTE — Telephone Encounter (Signed)
PLEASE CALL PT. IT WOULD BE UNUSUAL FOR METALLIC TASTE TO BE RELATED TO THE EGD. IF IT CONTINUES SHE SHOULD SEE HER DENTIST.

## 2019-04-27 NOTE — Telephone Encounter (Signed)
Called pt notified, stated she would f/u with dentist if it continues and thanked me for the call

## 2019-04-27 NOTE — Telephone Encounter (Signed)
Pt called and stated she is having a metallic  taste in her mouth since her procedure on 4/14, pt wants to know if this is normal, pt states she is  not having any other concerns or issues

## 2019-06-18 IMAGING — MG DIGITAL SCREENING BILATERAL MAMMOGRAM WITH TOMO AND CAD
6 of 10 series · 6 of 30 positions shown · non-contrast
Comparison: Previous exam(s).

CLINICAL DATA: Screening.

EXAM:
DIGITAL SCREENING BILATERAL MAMMOGRAM WITH TOMO AND CAD

[L MLO synth-2D]
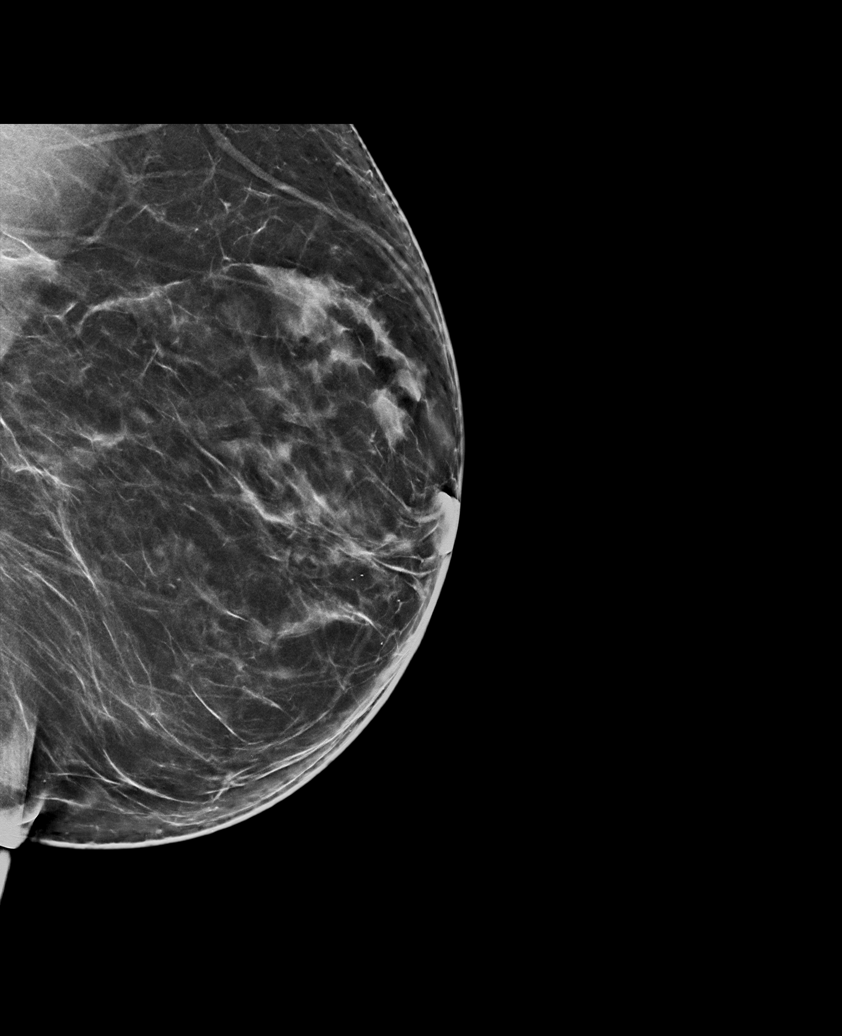

[R MLO synth-2D (1 of 2)]
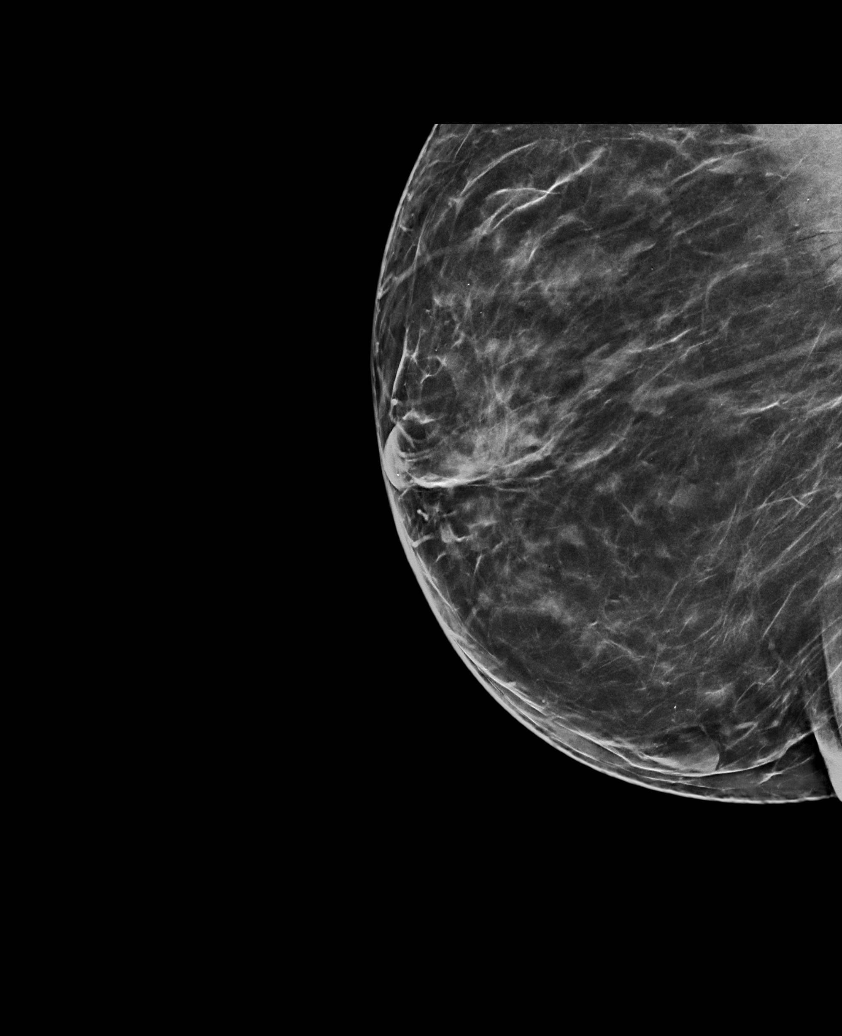

[L CC synth-2D]
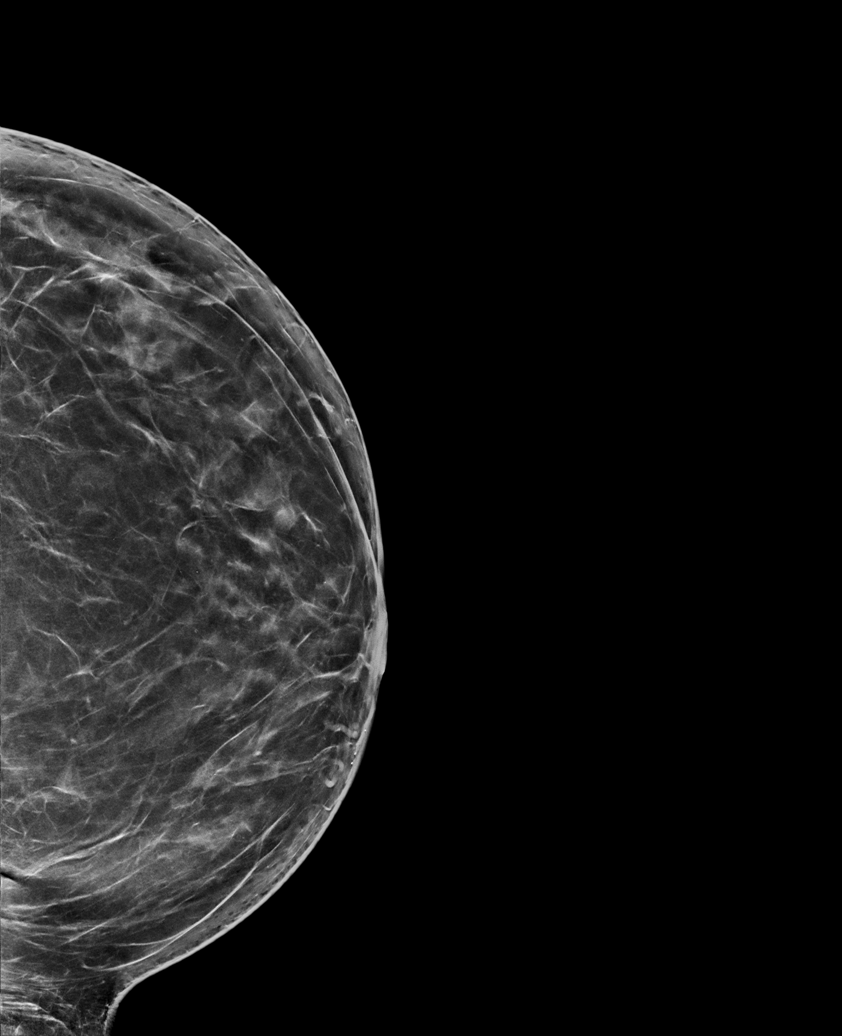

[R MLO synth-2D (2 of 2)]
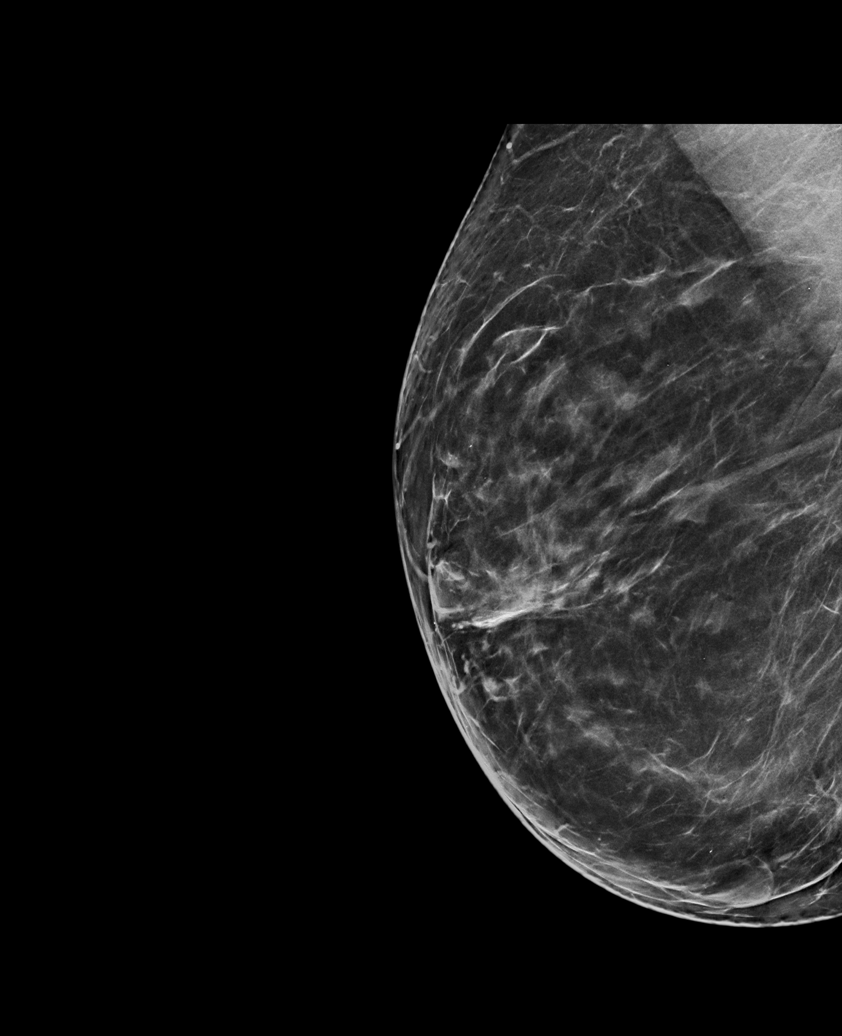

[R CC synth-2D]
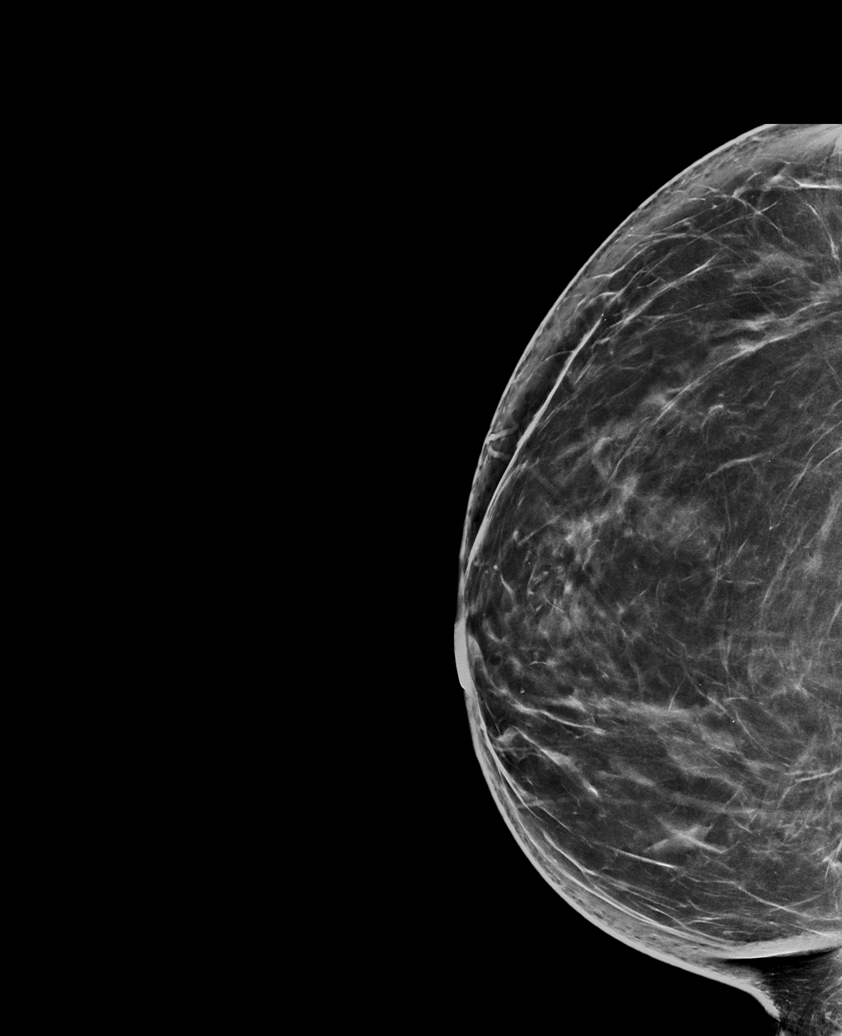

[R CC tomo · tomo slice 43/86.0]
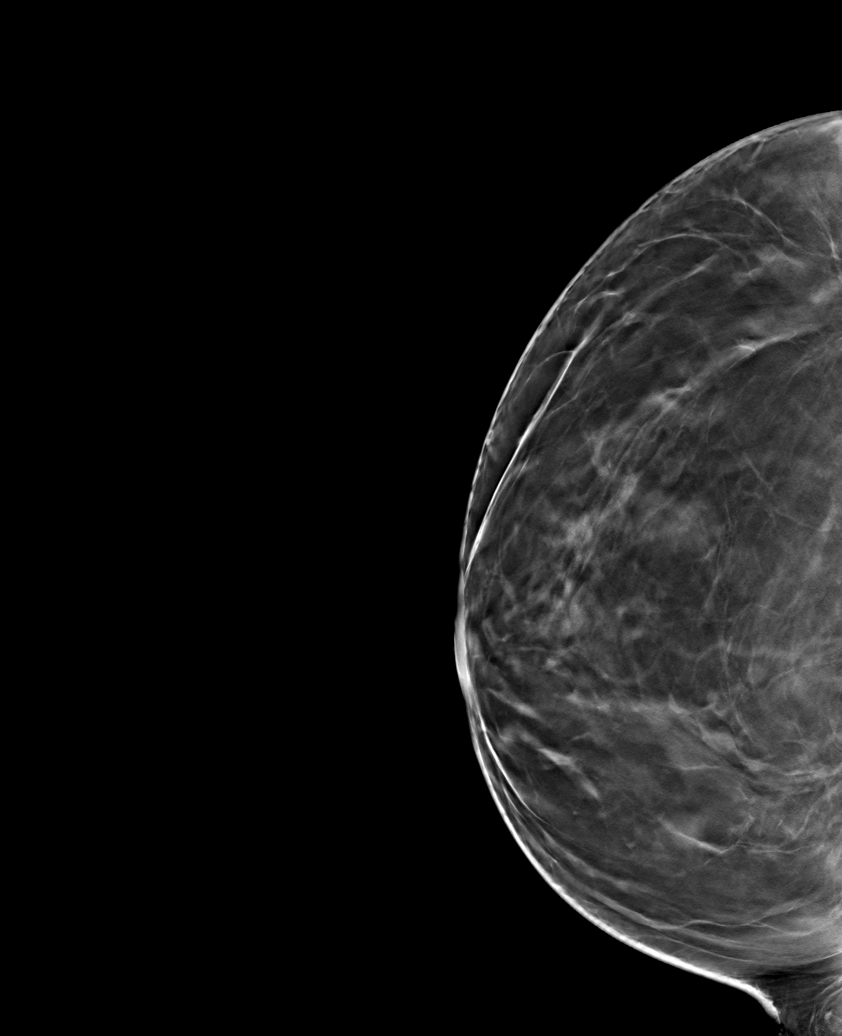

[6 of 30 positions shown; findings below may reference images not displayed]

ACR Breast Density Category b: There are scattered areas of
fibroglandular density.
FINDINGS: There are no findings suspicious for malignancy. Images were
processed with CAD.
IMPRESSION: No mammographic evidence of malignancy. A result letter of this
screening mammogram will be mailed directly to the patient.

RECOMMENDATION:
Screening mammogram in one year. (Code:[id number])

BI-RADS CATEGORY  1: Negative.

## 2019-08-22 NOTE — Progress Notes (Signed)
Referring Provider: Mirna Mires, MD Primary Care Physician:  Mirna Mires, MD Primary GI Physician: Dr. Marletta Lor  Chief Complaint  Patient presents with  . Gastroesophageal Reflux    pp f/u, doing ok    HPI:   Adriana Ramsey is a 54 y.o. female presenting today for follow-up. History of mildly elevated AST/ALT with previous viral markers for hepatitis B and C negative, fatty liver, probable gallbladder polyp in 2013 although repeat ultrasound in October 2015 with no gallbladder polyp, GERD, PUD 2011, dysphagia, IBS-C.  Colonoscopy up-to-date March 2018 with mild diverticulosis in the sigmoid and descending colon, redundant rectosigmoid colon, external and internal hemorrhoids.  Due for repeat colonoscopy in 2028.   Last seen in our office 11/23/2018.  She reported worsening GERD symptoms over the last 4 weeks on omeprazole 40 mg twice daily, mild dysphagia with "having to swallow and extra time" to get food to go down.  Constipation much improved with increasing vegetable and water intake, no longer requiring Linzess.  Regarding LFTs, recent labs in July 2020 with LFTs within normal limits.  She was without signs or symptoms of advanced liver disease.  She did report intermittent chest heaviness. Plan included stopping omeprazole and started Dexilant 60 mg daily, see cardiology prior to scheduling EGD +/- dilation due to report of chest heaviness with history of HTN.  Patient saw cardiology and completed Myoview.  Myoview was low risk without significant ischemia to suggest obstructive CAD.  She was cleared for EGD. EGD 04/21/2019: Moderate Schatzki's ring s/p dilation, small hiatal hernia, otherwise normal exam.  Today:   GERD: Taking omeprazole 40 mg daily. No breakthrough symptoms. Avoiding fatty and spicy foods which has really helped. No abdominal. No nausea or vomiting.   Dysaphia: Resolved.   IBS-C: BMs daily. Pushing water and vegetables. No blood in the stool. No black  stool.   Fatty liver/elevated LFTs: No recent labs completed. Working on weight loss. Walking after work.  Eating healthier.  One glass of wine a week. Tylenol 500 mg daily due to leg pain.   Past Medical History:  Diagnosis Date  . Essential hypertension   . Fatty liver   . Gallbladder polyp 2012   Last ultrasound 2015, no evidence of gallbladder polyp  . GERD (gastroesophageal reflux disease)   . PUD (peptic ulcer disease) 2011  . Seasonal allergies     Past Surgical History:  Procedure Laterality Date  . BREAST REDUCTION SURGERY  2007  . CESAREAN SECTION     2  . COLONOSCOPY N/A 03/14/2016   Procedure: COLONOSCOPY;  Surgeon: West Bali, MD;  mild diverticulosis in the sigmoid and descending colon, redundant rectosigmoid colon, external and internal hemorrhoids.  Due for repeat colonoscopy in 2028.   Marland Kitchen ESOPHAGOGASTRODUODENOSCOPY  11/23/2009   NUR: hypopharyngeal erosions of unclear sidnificance/mild changes of reflux /small sliding hiatal hernia and non Schatzki ring/3-mm gastric ulcer along the antrum with 2 scars and few erosions. h.pyori serologies negative  . ESOPHAGOGASTRODUODENOSCOPY N/A 04/21/2019   Procedure: ESOPHAGOGASTRODUODENOSCOPY (EGD);  Surgeon: West Bali, MD;  Moderate Schatzki's ring s/p dilation, small hiatal hernia, otherwise normal exam.  . PARTIAL HYSTERECTOMY    . REDUCTION MAMMAPLASTY Bilateral   . SAVORY DILATION N/A 04/21/2019   Procedure: SAVORY DILATION;  Surgeon: West Bali, MD;  Location: [facility];  Service: Endoscopy;  Laterality: N/A;    Current Outpatient Medications  Medication Sig Dispense Refill  . atorvastatin (LIPITOR) 10 MG tablet Take 10 mg by  mouth daily.    . cholecalciferol (VITAMIN D) 1000 units tablet Take 1 tablet (1,000 Units total) by mouth daily.    Marland Kitchen EPINEPHrine (EPI-PEN) 0.3 mg/0.3 mL DEVI Inject 0.3 mLs (0.3 mg total) into the muscle once. 2 Device 1  . hydrochlorothiazide (HYDRODIURIL) 25 MG tablet Take 1  tablet (25 mg total) daily by mouth. 90 tablet 2  . omeprazole (PRILOSEC) 40 MG capsule Take 40 mg by mouth daily.     No current facility-administered medications for this visit.    Allergies as of 08/23/2019 - Review Complete 08/23/2019  Allergen Reaction Noted  . Sulfur Swelling 07/17/2011  . Ibuprofen Itching 09/22/2013  . Penicillins Hives and Swelling 05/01/2012    Family History  Problem Relation Age of Onset  . Hyperlipidemia Mother   . Seizures Mother   . Heart attack Maternal Grandfather        72  . Colon cancer Neg Hx   . Liver disease Neg Hx     Social History   Socioeconomic History  . Marital status: Married    Spouse name: Not on file  . Number of children: 2  . Years of education: Not on file  . Highest education level: Not on file  Occupational History  . Occupation: Immunologist    Employer: [employer]  Tobacco Use  . Smoking status: Never Smoker  . Smokeless tobacco: Never Used  Vaping Use  . Vaping Use: Never used  Substance and Sexual Activity  . Alcohol use: Yes    Comment: wine 1 glass per week  . Drug use: No  . Sexual activity: Not on file  Other Topics Concern  . Not on file  Social History Narrative   Lives w/ husband & 1 son, 1 grown daughter   Social Determinants of Health   Financial Resource Strain:   . Difficulty of Paying Living Expenses:   Food Insecurity:   . Worried About Programme researcher, broadcasting/film/video in the Last Year:   . Barista in the Last Year:   Transportation Needs:   . Freight forwarder (Medical):   Marland Kitchen Lack of Transportation (Non-Medical):   Physical Activity:   . Days of Exercise per Week:   . Minutes of Exercise per Session:   Stress:   . Feeling of Stress :   Social Connections:   . Frequency of Communication with Friends and Family:   . Frequency of Social Gatherings with Friends and Family:   . Attends Religious Services:   . Active Member of Clubs or Organizations:   . Attends  Banker Meetings:   Marland Kitchen Marital Status:     Review of Systems: Gen: Denies fever, chills, cold or flulike symptoms, lightheadedness, dizziness, presyncope, syncope. CV: Denies chest pain or palpitations. Resp: Denies dyspnea or cough. GI: See HPI  Heme: See HPI  Physical Exam: BP 137/85   Pulse 81   Temp (!) 97.3 F (36.3 C) (Temporal)   Ht 5' (1.524 m)   Wt 163 lb 12.8 oz (74.3 kg)   BMI 31.99 kg/m  General:   Alert and oriented. No distress noted. Pleasant and cooperative.  Head:  Normocephalic and atraumatic. Eyes:  Conjuctiva clear without scleral icterus. Heart:  S1, S2 present without murmurs appreciated. Lungs:  Clear to auscultation bilaterally. No wheezes, rales, or rhonchi. No distress.  Abdomen:  +BS, soft, non-tender and non-distended. No rebound or guarding. No HSM or masses noted. Msk:  Symmetrical without gross  deformities. Normal posture. Extremities:  Without edema. Neurologic:  Alert and  oriented x4 Psych:  Alert and cooperative. Normal mood and affect.

## 2019-08-23 ENCOUNTER — Ambulatory Visit: Payer: Self-pay | Admitting: Gastroenterology

## 2019-08-23 ENCOUNTER — Other Ambulatory Visit: Payer: Self-pay

## 2019-08-23 ENCOUNTER — Encounter: Payer: Self-pay | Admitting: Gastroenterology

## 2019-08-23 VITALS — BP 137/85 | HR 81 | Temp 97.3°F | Ht 60.0 in | Wt 163.8 lb

## 2019-08-23 DIAGNOSIS — R7989 Other specified abnormal findings of blood chemistry: Secondary | ICD-10-CM

## 2019-08-23 DIAGNOSIS — K219 Gastro-esophageal reflux disease without esophagitis: Secondary | ICD-10-CM

## 2019-08-23 DIAGNOSIS — K581 Irritable bowel syndrome with constipation: Secondary | ICD-10-CM

## 2019-08-23 DIAGNOSIS — R1310 Dysphagia, unspecified: Secondary | ICD-10-CM

## 2019-08-23 DIAGNOSIS — K760 Fatty (change of) liver, not elsewhere classified: Secondary | ICD-10-CM

## 2019-08-23 NOTE — Assessment & Plan Note (Signed)
History of IBS-C.  Symptoms currently well controlled with dietary adjustments including increased fruit and vegetable and water intake.  Currently with BMs daily.  No abdominal pain.  No alarm symptoms.  Colonoscopy up-to-date and due for repeat in 2028.  Plan: Continue eating plenty of fruits and vegetables and drinking plenty of water. Monitor for return of constipation. Follow-up in 6 months.

## 2019-08-23 NOTE — Assessment & Plan Note (Addendum)
Resolved s/p EGD 04/21/2019 revealing moderate Schatzki's ring s/p dilation.  GERD symptoms are also well controlled on omeprazole 40 mg daily.  Plan: Continue to monitor for return of symptoms. Continue omeprazole 40 mg daily. Follow-up in 6 months.

## 2019-08-23 NOTE — Assessment & Plan Note (Signed)
Well-controlled on omeprazole 40 mg daily 30 minutes for breakfast.  She is also avoiding fried/fatty foods and spicy foods as these are known dietary triggers.  No alarm symptoms.  Dysphagia resolved s/p EGD 04/21/2019 revealing moderate Schatzki's ring A/P dilation, small hiatal hernia, otherwise normal exam.  Plan: Continue omeprazole 40 mg daily 30 minutes before breakfast. Continue to avoid fried, fatty, greasy, spicy foods. Follow-up in 6 months.

## 2019-08-23 NOTE — Assessment & Plan Note (Signed)
History of fatty liver with mildly elevated LFTs historically.  Prior evaluation with viral markers for hepatitis B and C negative.  LFTs had returned to normal in July 2020.  She is working on weight loss through dietary changes and increased exercise. No signs or symptoms of decompensated liver disease.    Plan: Update HFP Continue working on weight loss through diet and exercise. Follow-up in 6 months.

## 2019-08-23 NOTE — Patient Instructions (Addendum)
Please have labs completed at [facility].   Continue taking omeprazole 40 mg daily 30 minutes before breakfast.  Continue avoiding fried, fatty, greasy, spicy foods.  Continue eating plenty of fruits and vegetables and drinking plenty of water as this has significantly improved your bowel regularity.  We will plan to see you back in 6 months.  Do not hesitate to call with questions or concerns prior.  Ermalinda Memos, PA-C [facility]

## 2019-08-23 NOTE — Assessment & Plan Note (Signed)
Addressed under fatty liver.  

## 2019-11-30 ENCOUNTER — Ambulatory Visit: Payer: Self-pay | Attending: Internal Medicine

## 2019-11-30 DIAGNOSIS — Z23 Encounter for immunization: Secondary | ICD-10-CM

## 2019-11-30 NOTE — Progress Notes (Signed)
   Covid-19 Vaccination Clinic  Name:  [name] [facility]    MRN: [id number] DOB: September 12, 1965  11/30/2019  Adriana Ramsey was observed post Covid-19 immunization for 15 minutes without incident. She was provided with Vaccine Information Sheet and instruction to access the V-Safe system.   [name] was instructed to call 911 with any severe reactions post vaccine: Marland Kitchen Difficulty breathing  . Swelling of face and throat  . A fast heartbeat  . A bad rash all over body  . Dizziness and weakness   Immunizations Administered    Name Date Dose VIS Date Route   Pfizer COVID-19 Vaccine 11/30/2019  5:34 PM 0.3 mL 10/27/2019 Intramuscular   Manufacturer: [employer], [employer]   Lot: [id number]   NDC: [address]-2

## 2020-01-13 ENCOUNTER — Encounter: Payer: Self-pay | Admitting: Internal Medicine

## 2020-01-21 ENCOUNTER — Other Ambulatory Visit: Payer: Self-pay

## 2020-01-21 DIAGNOSIS — Z20822 Contact with and (suspected) exposure to covid-19: Secondary | ICD-10-CM

## 2020-01-23 LAB — SARS-COV-2, NAA 2 DAY TAT

## 2020-01-23 LAB — NOVEL CORONAVIRUS, NAA: SARS-CoV-2, NAA: NOT DETECTED

## 2020-02-23 ENCOUNTER — Ambulatory Visit: Payer: Self-pay | Admitting: Gastroenterology

## 2020-02-29 NOTE — Progress Notes (Deleted)
Referring Provider: Mirna Mires, MD Primary Care Physician:  Mirna Mires, MD Primary GI Physician: Dr. Marletta Lor  No chief complaint on file.   HPI:   Adriana Ramsey is a 55 y.o. female presenting today for 80-month follow-up. History of mildly elevated AST/ALT with previous viral markers for hepatitis B and C negative, fatty liver, probable gallbladder polyp in 2013 although repeat ultrasound in October 2015 with no gallbladder polyp, GERD, PUD 2011, dysphagia, IBS-C.  Colonoscopy up-to-date March 2018 with mild diverticulosis in the sigmoid and descending colon, redundant rectosigmoid colon, external and internal hemorrhoids.  Due for repeat colonoscopy in 2028. EGD 04/21/2019: Moderate Schatzki's ring s/p dilation, small hiatal hernia, otherwise normal exam.  Last seen in our office 08/23/2019. GERD well controlled on omeprazole 40 mg daily and avoiding fried and spicy foods. Dysphagia resolved with prior dilation. IBS-C symptoms well controlled with increasing water and vegetable intake, having BMs daily without alarm symptoms. She has been working on weight loss, eating healthier, walking after work. She has not had any recent LFTs. Notably, LFTs in July 2020 had normalized.  Advised to continue dietary adjustments, exercising routinely, omeprazole 40 mg daily, and update HFP.  Follow-up in 6 months.   HFP not completed.   Today:   GERD:   Constipation:   Fatty Liver/Elevated LFTs:    Past Medical History:  Diagnosis Date  . Essential hypertension   . Fatty liver   . Gallbladder polyp 2012   Last ultrasound 2015, no evidence of gallbladder polyp  . GERD (gastroesophageal reflux disease)   . PUD (peptic ulcer disease) 2011  . Seasonal allergies     Past Surgical History:  Procedure Laterality Date  . BREAST REDUCTION SURGERY  2007  . CESAREAN SECTION     2  . COLONOSCOPY N/A 03/14/2016   Procedure: COLONOSCOPY;  Surgeon: West Bali, MD;  mild diverticulosis in  the sigmoid and descending colon, redundant rectosigmoid colon, external and internal hemorrhoids.  Due for repeat colonoscopy in 2028.   Marland Kitchen ESOPHAGOGASTRODUODENOSCOPY  11/23/2009   NUR: hypopharyngeal erosions of unclear sidnificance/mild changes of reflux /small sliding hiatal hernia and non Schatzki ring/3-mm gastric ulcer along the antrum with 2 scars and few erosions. h.pyori serologies negative  . ESOPHAGOGASTRODUODENOSCOPY N/A 04/21/2019   Procedure: ESOPHAGOGASTRODUODENOSCOPY (EGD);  Surgeon: West Bali, MD;  Moderate Schatzki's ring s/p dilation, small hiatal hernia, otherwise normal exam.  . PARTIAL HYSTERECTOMY    . REDUCTION MAMMAPLASTY Bilateral   . SAVORY DILATION N/A 04/21/2019   Procedure: SAVORY DILATION;  Surgeon: West Bali, MD;  Location: [facility];  Service: Endoscopy;  Laterality: N/A;    Current Outpatient Medications  Medication Sig Dispense Refill  . atorvastatin (LIPITOR) 10 MG tablet Take 10 mg by mouth daily.    . cholecalciferol (VITAMIN D) 1000 units tablet Take 1 tablet (1,000 Units total) by mouth daily.    Marland Kitchen EPINEPHrine (EPI-PEN) 0.3 mg/0.3 mL DEVI Inject 0.3 mLs (0.3 mg total) into the muscle once. 2 Device 1  . hydrochlorothiazide (HYDRODIURIL) 25 MG tablet Take 1 tablet (25 mg total) daily by mouth. 90 tablet 2  . omeprazole (PRILOSEC) 40 MG capsule Take 40 mg by mouth daily.     No current facility-administered medications for this visit.    Allergies as of 03/01/2020 - Review Complete 08/23/2019  Allergen Reaction Noted  . Elemental sulfur Swelling 07/17/2011  . Ibuprofen Itching 09/22/2013  . Penicillins Hives and Swelling 05/01/2012    Family History  Problem Relation Age of Onset  . Hyperlipidemia Mother   . Seizures Mother   . Heart attack Maternal Grandfather        72  . Colon cancer Neg Hx   . Liver disease Neg Hx     Social History   Socioeconomic History  . Marital status: Married    Spouse name: Not on file  .  Number of children: 2  . Years of education: Not on file  . Highest education level: Not on file  Occupational History  . Occupation: Immunologist    Employer: [employer]  Tobacco Use  . Smoking status: Never Smoker  . Smokeless tobacco: Never Used  Vaping Use  . Vaping Use: Never used  Substance and Sexual Activity  . Alcohol use: Yes    Comment: wine 1 glass per week  . Drug use: No  . Sexual activity: Not on file  Other Topics Concern  . Not on file  Social History Narrative   Lives w/ husband & 1 son, 1 grown daughter   Social Determinants of Health   Financial Resource Strain: Not on file  Food Insecurity: Not on file  Transportation Needs: Not on file  Physical Activity: Not on file  Stress: Not on file  Social Connections: Not on file    Review of Systems: Gen: Denies fever, chills, anorexia. Denies fatigue, weakness, weight loss.  CV: Denies chest pain, palpitations, syncope, peripheral edema, and claudication. Resp: Denies dyspnea at rest, cough, wheezing, coughing up blood, and pleurisy. GI: Denies vomiting blood, jaundice, and fecal incontinence.   Denies dysphagia or odynophagia. Derm: Denies rash, itching, dry skin Psych: Denies depression, anxiety, memory loss, confusion. No homicidal or suicidal ideation.  Heme: Denies bruising, bleeding, and enlarged lymph nodes.  Physical Exam: There were no vitals taken for this visit. General:   Alert and oriented. No distress noted. Pleasant and cooperative.  Head:  Normocephalic and atraumatic. Eyes:  Conjuctiva clear without scleral icterus. Mouth:  Oral mucosa pink and moist. Good dentition. No lesions. Heart:  S1, S2 present without murmurs appreciated. Lungs:  Clear to auscultation bilaterally. No wheezes, rales, or rhonchi. No distress.  Abdomen:  +BS, soft, non-tender and non-distended. No rebound or guarding. No HSM or masses noted. Msk:  Symmetrical without gross deformities. Normal  posture. Extremities:  Without edema. Neurologic:  Alert and  oriented x4 Psych:  Alert and cooperative. Normal mood and affect.

## 2020-03-01 ENCOUNTER — Ambulatory Visit: Payer: Self-pay | Admitting: Gastroenterology

## 2020-03-01 ENCOUNTER — Other Ambulatory Visit: Payer: Self-pay

## 2020-03-09 ENCOUNTER — Encounter: Payer: Self-pay | Admitting: Internal Medicine

## 2020-04-06 ENCOUNTER — Ambulatory Visit: Payer: Self-pay | Admitting: Gastroenterology

## 2020-04-14 ENCOUNTER — Ambulatory Visit: Payer: Self-pay | Admitting: Gastroenterology

## 2021-01-17 ENCOUNTER — Encounter: Payer: Self-pay | Admitting: *Deleted

## 2021-01-18 ENCOUNTER — Telehealth: Payer: Self-pay | Admitting: Psychiatry

## 2021-01-18 ENCOUNTER — Ambulatory Visit: Payer: Self-pay | Admitting: Psychiatry

## 2021-01-18 NOTE — Progress Notes (Deleted)
[facility]  PATIENT: Adriana Ramsey DOB: 15-Dec-1965  REFERRING CLINICIAN: Mirna Mires, MD HISTORY FROM: *** REASON FOR VISIT: memory loss   HISTORICAL  CHIEF COMPLAINT:  No chief complaint on file.   HISTORY OF PRESENT ILLNESS:  The patient presents for evaluation of memory loss which has been present since  TBI: *** No past history of TBI Stroke: *** no past history of stroke Seizures: *** no past history of seizures Sleep: *** no history of sleep apnea.  Has *** never had sleep study.  STOP BANG score *** Mood: *** patient denies anxiety and depression  Functional status: independent in all ** ADLs and IADLs Patient lives with *** in a *** with *** stairs. Cooking: *** Cleaning: *** Shopping: *** Bathing: *** Toileting: *** Driving: *** Bills: *** Medications: *** Ever left the stove on by accident?: *** Forget how to use items around the house?: *** Getting lost going to familiar places?: *** Forgetting loved ones names?: *** Word finding difficulty? *** Sleep: ***  OTHER MEDICAL CONDITIONS: ***   REVIEW OF SYSTEMS: Full 14 system review of systems performed and negative with exception of: ***  ALLERGIES: Allergies  Allergen Reactions   Elemental Sulfur Swelling   Ibuprofen Itching   Penicillins Hives and Swelling    HOME MEDICATIONS: Outpatient Medications Prior to Visit  Medication Sig Dispense Refill   atorvastatin (LIPITOR) 10 MG tablet Take 10 mg by mouth daily.     cholecalciferol (VITAMIN D) 1000 units tablet Take 1 tablet (1,000 Units total) by mouth daily.     EPINEPHrine (EPI-PEN) 0.3 mg/0.3 mL DEVI Inject 0.3 mLs (0.3 mg total) into the muscle once. 2 Device 1   hydrochlorothiazide (HYDRODIURIL) 25 MG tablet Take 1 tablet (25 mg total) daily by mouth. 90 tablet 2   omeprazole (PRILOSEC) 40 MG capsule Take 40 mg by mouth daily.     No facility-administered medications prior to visit.    PAST MEDICAL  HISTORY: Past Medical History:  Diagnosis Date   Essential hypertension    Fatty liver    Gallbladder polyp 2012   Last ultrasound 2015, no evidence of gallbladder polyp   GERD (gastroesophageal reflux disease)    Memory loss    PUD (peptic ulcer disease) 2011   Seasonal allergies     PAST SURGICAL HISTORY: Past Surgical History:  Procedure Laterality Date   BREAST REDUCTION SURGERY  2007   CESAREAN SECTION     2   COLONOSCOPY N/A 03/14/2016   Procedure: COLONOSCOPY;  Surgeon: West Bali, MD;  mild diverticulosis in the sigmoid and descending colon, redundant rectosigmoid colon, external and internal hemorrhoids.  Due for repeat colonoscopy in 2028.    ESOPHAGOGASTRODUODENOSCOPY  11/23/2009   NUR: hypopharyngeal erosions of unclear sidnificance/mild changes of reflux /small sliding hiatal hernia and non Schatzki ring/3-mm gastric ulcer along the antrum with 2 scars and few erosions. h.pyori serologies negative   ESOPHAGOGASTRODUODENOSCOPY N/A 04/21/2019   Procedure: ESOPHAGOGASTRODUODENOSCOPY (EGD);  Surgeon: West Bali, MD;  Moderate Schatzki's ring s/p dilation, small hiatal hernia, otherwise normal exam.   PARTIAL HYSTERECTOMY     REDUCTION MAMMAPLASTY Bilateral    SAVORY DILATION N/A 04/21/2019   Procedure: SAVORY DILATION;  Surgeon: West Bali, MD;  [facility];  Service: Endoscopy;  Laterality: N/A;    FAMILY HISTORY: Family History  Problem Relation Age of Onset   Hyperlipidemia Mother    Seizures Mother    Hypertension Father    Heart attack  Maternal Grandfather        6172   Colon cancer Neg Hx    Liver disease Neg Hx     SOCIAL HISTORY: Social History   Socioeconomic History   Marital status: Married    Spouse name: Not on file   Number of children: 2   Years of education: Not on file   Highest education level: Not on file  Occupational History   Occupation: billing specialist [employer]    Employer: [employer]  Tobacco Use    Smoking status: Never   Smokeless tobacco: Never  Vaping Use   Vaping Use: Never used  Substance and Sexual Activity   Alcohol use: Yes    Comment: wine 1 glass per week   Drug use: No   Sexual activity: Not on file  Other Topics Concern   Not on file  Social History Narrative   Lives w/ husband & 1 son, 1 grown daughter   Social Determinants of Health   Financial Resource Strain: Not on file  Food Insecurity: Not on file  Transportation Needs: Not on file  Physical Activity: Not on file  Stress: Not on file  Social Connections: Not on file  Intimate Partner Violence: Not on file     PHYSICAL EXAM ***  GENERAL EXAM/CONSTITUTIONAL: Vitals: There were no vitals filed for this visit. There is no height or weight on file to calculate BMI. Wt Readings from Last 3 Encounters:  08/23/19 163 lb 12.8 oz (74.3 kg)  04/21/19 140 lb (63.5 kg)  12/18/18 170 lb 3.2 oz (77.2 kg)   Patient is in no distress; well developed, nourished and groomed; neck is supple  CARDIOVASCULAR: Examination of carotid arteries is normal; no carotid bruits Regular rate and rhythm, no murmurs Examination of peripheral vascular system by observation and palpation is normal  EYES: Pupils round and reactive to light, Visual fields full to confrontation, Extraocular movements intacts,   MUSCULOSKELETAL: Gait, strength, tone, movements noted in Neurologic exam below  NEUROLOGIC: MENTAL STATUS:  No flowsheet data found. awake, alert, oriented to person, place and time recent and remote memory intact normal attention and concentration language fluent, comprehension intact, naming intact fund of knowledge appropriate  CRANIAL NERVE:  2nd - no papilledema or hemorrhages on fundoscopic exam 2nd, 3rd, 4th, 6th - pupils equal and reactive to light, visual fields full to confrontation, extraocular muscles intact, no nystagmus 5th - facial sensation symmetric 7th - facial strength symmetric 8th -  hearing intact 9th - palate elevates symmetrically, uvula midline 11th - shoulder shrug symmetric 12th - tongue protrusion midline  MOTOR:  normal bulk and tone, no cogwheeling, full strength in the BUE, BLE  SENSORY:  normal and symmetric to light touch, pinprick, temperature, vibration  COORDINATION:  finger-nose-finger, fine finger movements normal, no tremor  REFLEXES:  deep tendon reflexes present and symmetric  GAIT/STATION:  normal     DIAGNOSTIC DATA (LABS, IMAGING, TESTING) - I reviewed patient records, labs, notes, testing and imaging myself where available.  Lab Results  Component Value Date   WBC 4.0 03/25/2017   HGB 13.8 03/25/2017   HCT 40.2 03/25/2017   MCV 80.2 03/25/2017   PLT 302 03/25/2017      Component Value Date/Time   NA 140 03/25/2017 0913   K 4.0 03/25/2017 0913   CL 102 03/25/2017 0913   CO2 31 03/25/2017 0913   GLUCOSE 103 (H) 03/25/2017 0913   BUN 15 03/25/2017 0913   CREATININE 0.85 03/25/2017 0913  CALCIUM 10.0 03/25/2017 0913   PROT 7.7 03/25/2017 0913   ALBUMIN 4.5 09/06/2016 0848   AST 30 03/25/2017 0913   ALT 32 (H) 03/25/2017 0913   ALKPHOS 59 09/06/2016 0848   BILITOT 0.7 03/25/2017 0913   GFRNONAA 78 09/06/2016 0848   GFRAA >89 09/06/2016 0848   Lab Results  Component Value Date   CHOL 189 03/25/2017   HDL 44 (L) 03/25/2017   LDLCALC 117 (H) 03/25/2017   TRIG 165 (H) 03/25/2017   CHOLHDL 4.3 03/25/2017   Lab Results  Component Value Date   HGBA1C 5.8 (H) 02/07/2016   No results found for: [id number] Lab Results  Component Value Date   TSH 1.92 02/07/2016    ***    ASSESSMENT AND PLAN  56 y.o. year old female with ***   No diagnosis found.    PLAN: - Labs: CBC, CMP, TSH, B12, RPR  - MRI brain to assess for signs of neurodegeneration and/or significant vascular disease.  - Will place referral for neuropsychological testing to better characterize his/her reported deficits and to establish a  cognitive baseline.  - Referral placed for cognitive rehabilitation to help with some of his/her issues.  - Follow up after testing is complete.   No orders of the defined types were placed in this encounter.   No orders of the defined types were placed in this encounter.   No follow-ups on file.  I spent an average of *** chart reviewing and counseling the patient, with at least 50% of the time face to face with the patient. General brain health measures discussed, including the importance of regular aerobic exercise. Reviewed safety measures including driving safety.   Ocie Doyne, MD  [facility] [address], [address] [place], [place] [address] [phone]

## 2021-01-18 NOTE — Telephone Encounter (Signed)
LVM and sent mychart msg- appt cancelled due MD going home sick.

## 2021-06-14 ENCOUNTER — Encounter: Payer: Self-pay | Admitting: *Deleted

## 2021-06-14 NOTE — Progress Notes (Signed)
[facility]  PATIENT: Adriana Ramsey DOB: 09-21-1965  REFERRING CLINICIAN: Mirna Mires, MD HISTORY FROM: self, husband Adriana Ramsey REASON FOR VISIT: memory loss   HISTORICAL  CHIEF COMPLAINT:  Chief Complaint  Patient presents with   New Patient (Initial Visit)    Room 2 w/ husband, Adriana Ramsey. Referred for memory concerns.     HISTORY OF PRESENT ILLNESS:  The patient presents for evaluation of short term memory loss which has been present for the past 6 months. She will repeat herself in conversations and repeat questions. Will ask her husband if he ate even though she just gave him food recently. Family began to get concerned recently because she had an episode where she got lost while driving. Missed an exit and had trouble finding her way back home. She did eventually get home on her own. She is still driving, but only short distances to familiar places. Drives to work and her daughter's house which is 10 minutes away.   Her mother was diagnosed with dementia.  TBI:  No past history of TBI Stroke:  no past history of stroke Seizures:  no past history of seizures Sleep:  Goes to bed at night around midnight and wakes up around 6-7 AM. She snores loudly at night. Occasionally feels sleepy during the day. Has not woken up gasping for air. Has never had a sleep study. Mood: She will get tearful when she forgets things. Gets frustrated when family checks up on here because they are worried about her.  Functional status:  Patient lives with husband Cooking: no issues Cleaning: no issues Shopping: does grocery shopping but has forgotten that she has already picked things up at the store Driving: got lost once while driving Bills: handles finances without issues Medications: has missed some doses of medications Ever left the stove on by accident?: Has left the oven on once Forgetting loved ones names?: no Word finding difficulty? yes  OTHER MEDICAL CONDITIONS:  HTN   REVIEW OF SYSTEMS: Full 14 system review of systems performed and negative with exception of: memory loss  ALLERGIES: Allergies  Allergen Reactions   Elemental Sulfur Swelling   Ibuprofen Itching   Penicillins Hives and Swelling    HOME MEDICATIONS: Outpatient Medications Prior to Visit  Medication Sig Dispense Refill   atorvastatin (LIPITOR) 10 MG tablet Take 10 mg by mouth daily.     cholecalciferol (VITAMIN D) 1000 units tablet Take 1 tablet (1,000 Units total) by mouth daily.     EPINEPHrine (EPI-PEN) 0.3 mg/0.3 mL DEVI Inject 0.3 mLs (0.3 mg total) into the muscle once. 2 Device 1   hydrochlorothiazide (HYDRODIURIL) 25 MG tablet Take 1 tablet (25 mg total) daily by mouth. 90 tablet 2   omeprazole (PRILOSEC) 40 MG capsule Take 40 mg by mouth daily.     No facility-administered medications prior to visit.    PAST MEDICAL HISTORY: Past Medical History:  Diagnosis Date   Essential hypertension    Fatty liver    Gallbladder polyp 2012   Last ultrasound 2015, no evidence of gallbladder polyp   GERD (gastroesophageal reflux disease)    Memory loss    PUD (peptic ulcer disease) 2011   Seasonal allergies     PAST SURGICAL HISTORY: Past Surgical History:  Procedure Laterality Date   BREAST REDUCTION SURGERY  2007   CESAREAN SECTION     2   COLONOSCOPY N/A 03/14/2016   Procedure: COLONOSCOPY;  Surgeon: West Bali, MD;  mild diverticulosis in the  sigmoid and descending colon, redundant rectosigmoid colon, external and internal hemorrhoids.  Due for repeat colonoscopy in 2028.    ESOPHAGOGASTRODUODENOSCOPY  11/23/2009   NUR: hypopharyngeal erosions of unclear sidnificance/mild changes of reflux /small sliding hiatal hernia and non Schatzki ring/3-mm gastric ulcer along the antrum with 2 scars and few erosions. h.pyori serologies negative   ESOPHAGOGASTRODUODENOSCOPY N/A 04/21/2019   Procedure: ESOPHAGOGASTRODUODENOSCOPY (EGD);  Surgeon: West BaliFields, Sandi L, MD;  Moderate  Schatzki's ring s/p dilation, small hiatal hernia, otherwise normal exam.   PARTIAL HYSTERECTOMY     REDUCTION MAMMAPLASTY Bilateral    SAVORY DILATION N/A 04/21/2019   Procedure: SAVORY DILATION;  Surgeon: West BaliFields, Sandi L, MD;  [facility]: [facility];  Service: Endoscopy;  Laterality: N/A;    FAMILY HISTORY: Family History  Problem Relation Age of Onset   Hyperlipidemia Mother    Seizures Mother    Hypertension Father    Heart attack Maternal Grandfather        6272   Colon cancer Neg Hx    Liver disease Neg Hx     SOCIAL HISTORY: Social History   Socioeconomic History   Marital status: Married    Spouse name: Not on file   Number of children: 2   Years of education: Not on file   Highest education level: Some college, no degree  Occupational History   Occupation: Research scientist (medical)billing specialist [employer]    Employer: [employer]  Tobacco Use   Smoking status: Never   Smokeless tobacco: Never  Vaping Use   Vaping Use: Never used  Substance and Sexual Activity   Alcohol use: Yes    Comment: wine 1 glass per week   Drug use: No   Sexual activity: Not on file  Other Topics Concern   Not on file  Social History Narrative   Lives w/ husband w/ one of their children.    Right-handed.   Caffeine use: one cup per day.    Social Determinants of Health   Financial Resource Strain: Not on file  Food Insecurity: Not on file  Transportation Needs: Not on file  Physical Activity: Not on file  Stress: Not on file  Social Connections: Not on file  Intimate Partner Violence: Not on file     PHYSICAL EXAM  GENERAL EXAM/CONSTITUTIONAL: Vitals:  Vitals:   06/15/21 0956  BP: (!) 142/96  Pulse: 80  Weight: 160 lb 3.2 oz (72.7 kg)  Height: 5' (1.524 m)   Body mass index is 31.29 kg/m. Wt Readings from Last 3 Encounters:  06/15/21 160 lb 3.2 oz (72.7 kg)  08/23/19 163 lb 12.8 oz (74.3 kg)  04/21/19 140 lb (63.5 kg)   NEUROLOGIC: MENTAL STATUS:    06/15/2021   10:08 AM   Montreal Cognitive Assessment   Visuospatial/ Executive (0/5) 2  Naming (0/3) 3  Attention: Read list of digits (0/2) 2  Attention: Read list of letters (0/1) 1  Attention: Serial 7 subtraction starting at 100 (0/3) 3  Language: Repeat phrase (0/2) 2  Language : Fluency (0/1) 1  Abstraction (0/2) 1  Delayed Recall (0/5) 0  Orientation (0/6) 5  Total 20  Adjusted Score (based on education) 20    CRANIAL NERVE:  2nd, 3rd, 4th, 6th - pupils equal and reactive to light, visual fields full to confrontation, extraocular muscles intact, no nystagmus 5th - facial sensation symmetric 7th - facial strength symmetric 8th - hearing intact 9th - palate elevates symmetrically, uvula midline 11th - shoulder shrug symmetric 12th - tongue  protrusion midline  MOTOR:  normal bulk and tone, no cogwheeling, full strength in the BUE, BLE  SENSORY:  normal and symmetric to light touch all 4 extremities  COORDINATION:  finger-nose-finger, fine finger movements normal, no tremor  REFLEXES:  deep tendon reflexes present and symmetric  GAIT/STATION:  normal    DIAGNOSTIC DATA (LABS, IMAGING, TESTING) - I reviewed patient records, labs, notes, testing and imaging myself where available.  Lab Results  Component Value Date   WBC 4.0 03/25/2017   HGB 13.8 03/25/2017   HCT 40.2 03/25/2017   MCV 80.2 03/25/2017   PLT 302 03/25/2017      Component Value Date/Time   NA 140 03/25/2017 0913   K 4.0 03/25/2017 0913   CL 102 03/25/2017 0913   CO2 31 03/25/2017 0913   GLUCOSE 103 (H) 03/25/2017 0913   BUN 15 03/25/2017 0913   CREATININE 0.85 03/25/2017 0913   CALCIUM 10.0 03/25/2017 0913   PROT 7.7 03/25/2017 0913   ALBUMIN 4.5 09/06/2016 0848   AST 30 03/25/2017 0913   ALT 32 (H) 03/25/2017 0913   ALKPHOS 59 09/06/2016 0848   BILITOT 0.7 03/25/2017 0913   GFRNONAA 78 09/06/2016 0848   GFRAA >89 09/06/2016 0848   Lab Results  Component Value Date   CHOL 189 03/25/2017   HDL 44 (L)  03/25/2017   LDLCALC 117 (H) 03/25/2017   TRIG 165 (H) 03/25/2017   CHOLHDL 4.3 03/25/2017   Lab Results  Component Value Date   HGBA1C 5.8 (H) 02/07/2016   No results found for: "VITAMINB12" Lab Results  Component Value Date   TSH 1.92 02/07/2016   12/11/20 CMP, Thyroid panel normal   ASSESSMENT AND PLAN  56 y.o. year old female with a history of HTN who presents for evaluation of memory loss over the past 6 months. MOCA score today is 20/30, which is consistent with mild cognitive impairment. Will check B12 level today and order MRI brain. Discussed general brain health measures including regular sleep schedule, aerobic exercise, and cognitive exercise. She and her husband will monitor for periods of apnea during sleep and daytime sleepiness. May consider Sleep evaluation in the future if she does notice symptoms to suggest OSA.   1. Memory loss       PLAN: - Labs: B12 - MRI brain  - Counseled to limit driving to short distances and familiar places - Follow up after testing is complete.   Orders Placed This Encounter  Procedures   MR BRAIN WO CONTRAST   Vitamin B12    No orders of the defined types were placed in this encounter.   Return in about 6 months (around 12/15/2021).  I spent an average of 31 minutes chart reviewing and counseling the patient, with at least 50% of the time face to face with the patient. General brain health measures discussed, including the importance of regular aerobic exercise. Reviewed safety measures including driving safety.   Ocie Doyne, MD 06/15/21 11:12 AM  [facility] [address], [address] [place], [place] [address] [phone]

## 2021-06-15 ENCOUNTER — Encounter: Payer: Self-pay | Admitting: Psychiatry

## 2021-06-15 ENCOUNTER — Ambulatory Visit (INDEPENDENT_AMBULATORY_CARE_PROVIDER_SITE_OTHER): Payer: BC Managed Care – PPO | Admitting: Psychiatry

## 2021-06-15 VITALS — BP 142/96 | HR 80 | Ht 60.0 in | Wt 160.2 lb

## 2021-06-15 DIAGNOSIS — R413 Other amnesia: Secondary | ICD-10-CM | POA: Diagnosis not present

## 2021-06-15 NOTE — Patient Instructions (Addendum)
Plan: -Brain MRI -Blood work to check vitamin B12 level -Let me know if you notice that you are waking up gasping for air or with your heart racing, or if you experience significant daytime sleepiness  A person with mild cognitive impairment (MCI) experiences memory problems greater than normally expected with aging, but not serious enough to interfere with daily activities.  The patient with MCI complains of difficulty with memory. Typically, the complaints include trouble remembering the names of people they met recently, trouble remembering the flow of a conversation, and an increased tendency to misplace things, or similar problems. In many cases, the individual will be aware of these difficulties and will compensate with increased reliance on notes and calendars.   Although there is an increased chances of going on to develop dementia, it is not possible currently to predict with certainty which patients with MCI will or will not go on to develop dementia. There is currently no specific treatment for MCI. People leading sedentary lifestyles are at greater risk for developing dementia. Increased physical activity and brain exercise can help with maintaining brain function.  Tasks to improve attention/working memory 1. Good sleep hygiene (7-8 hrs of sleep) 2. Learning a new skill (Painting, Carpentry, Pottery, new language, Knitting). 3.Cognitive exercises (keep a daily journal, Puzzles) 4. Physical exercise and training  (30 min/day X 4 days week) 5. Being on Antidepressant if needed 6.Yoga, Meditation, Tai Chi 7. Decrease alcohol intake 8.Have a clear schedule and structure in daily routine

## 2021-06-16 LAB — VITAMIN B12: Vitamin B-12: 472 pg/mL (ref 232–1245)

## 2021-06-21 ENCOUNTER — Telehealth: Payer: Self-pay | Admitting: Psychiatry

## 2021-06-21 NOTE — Telephone Encounter (Signed)
[employer]BS Auth: [id number] exp (06/21/21-07/20/21)- AB

## 2021-08-27 ENCOUNTER — Ambulatory Visit (INDEPENDENT_AMBULATORY_CARE_PROVIDER_SITE_OTHER): Payer: BC Managed Care – PPO | Admitting: Psychiatry

## 2021-08-27 VITALS — BP 152/93 | HR 85 | Ht <= 58 in | Wt 157.1 lb

## 2021-08-27 DIAGNOSIS — R413 Other amnesia: Secondary | ICD-10-CM | POA: Diagnosis not present

## 2021-08-27 DIAGNOSIS — R0683 Snoring: Secondary | ICD-10-CM | POA: Diagnosis not present

## 2021-08-27 DIAGNOSIS — R400 Somnolence: Secondary | ICD-10-CM | POA: Diagnosis not present

## 2021-08-27 DIAGNOSIS — G3184 Mild cognitive impairment, so stated: Secondary | ICD-10-CM | POA: Diagnosis not present

## 2021-08-27 MED ORDER — DONEPEZIL HCL 5 MG PO TABS: ORAL_TABLET | ORAL | 6 refills | Status: DC

## 2021-08-27 MED ORDER — DONEPEZIL HCL 5 MG PO TABS: 5.0000 mg | ORAL_TABLET | Freq: Every day | ORAL | 0 refills | Status: DC

## 2021-08-27 NOTE — Progress Notes (Signed)
   CC:  memory loss  Follow-up Visit  Last visit: 06/15/21  Brief HPI: 56 year old female with a history of HTN who follows in clinic for memory loss.  At her last visit B12 was normal. Brain MRI was ordered.  Interval History: Husband feels her memory has been worsening since June. She will forget things that she did 5 minutes prior, like eating or taking a bath. Family has had to repeat themselves frequently to her. She is driving to work without issues, however she got lost driving to the grocery store once. She has been taking Prevagen which has not made a difference.   She is not sleeping well. Continues to snore frequently at night and feel tired during the day.  Never had the MRI done.  Physical Exam:   Vital Signs: BP (!) 152/93   Pulse 85   Ht 4\' 9"  (1.448 m)   Wt 157 lb 2 oz (71.3 kg)   BMI 34.00 kg/m  GENERAL:  well appearing, in no acute distress, alert  SKIN:  Color, texture, turgor normal. No rashes or lesions HEAD:  Normocephalic/atraumatic. RESP: normal respiratory effort MSK:  No gross joint deformities.   NEUROLOGICAL: Mental Status:     08/27/2021   10:42 AM 06/15/2021   10:08 AM  Montreal Cognitive Assessment   Visuospatial/ Executive (0/5) 2 2  Naming (0/3) 3 3  Attention: Read list of digits (0/2) 2 2  Attention: Read list of letters (0/1) 1 1  Attention: Serial 7 subtraction starting at 100 (0/3) 3 3  Language: Repeat phrase (0/2) 2 2  Language : Fluency (0/1) 1 1  Abstraction (0/2) 2 1  Delayed Recall (0/5) 0 0  Orientation (0/6) 6 5  Total 22 20  Adjusted Score (based on education)  20   Cranial Nerves: PERRL, face symmetric, no dysarthria, hearing grossly intact Motor: moves all extremities equally Gait: normal-based.  IMPRESSION: 56 year old female with a history of HTN who presents for follow up of memory loss. MOCA today is 22/30 stable from her previous test in June. She has not noticed a difference in her memory though her family  members feel her memory has been progressively worsening. MRI brain is ordered. Will refer to neuropsychology for a better characterization of her deficits. Referral to sleep placed to assess for possible OSA given her history of snoring, daytime sleepiness, and HTN. Family would like her to try a memory medication. Will start donepezil for memory loss.  PLAN: -Start donepezil 5 mg daily x4 weeks, then increase to 10 mg daily -MRI brain -Referral for neuropsychological evaluation -Referral to sleep for OSA evaluation  Follow-up: December 2023  I spent a total of 21 minutes on the date of the service. Discussed medication side effects, adverse reactions and drug interactions. Written educational materials and patient instructions outlining all of the above were given.  January 2024, MD 08/27/21 11:19 AM

## 2021-08-27 NOTE — Patient Instructions (Addendum)
Start Donepezil: We recommended that you take Donepezil (Aricept) 5mg  tab once a day for the first 4 weeks, then increase the dose to 10mg  once per day. It is better to take Donepezil with breakfast or lunch. It is well tolerated, although some people may experience nausea, diarrhea, not feeling tired, or not wanting to eat. These side effects were usually mild and temporary. If symptoms continue or become severe, you should stop the medication and contact .          Brain MRI Referral to neuropsychology for more extensive memory testing Referral to Sleep team

## 2021-08-28 ENCOUNTER — Telehealth: Payer: Self-pay | Admitting: Psychiatry

## 2021-08-28 NOTE — Telephone Encounter (Signed)
Referral for memory loss sent to [facility] [phone] 

## 2021-09-06 ENCOUNTER — Telehealth: Payer: Self-pay | Admitting: Psychiatry

## 2021-09-06 NOTE — Telephone Encounter (Signed)
[employer]BS Berkley Harvey: [id number] exp. 09/06/2021 - 10/05/2021 sent to GI

## 2021-09-23 ENCOUNTER — Other Ambulatory Visit: Payer: Self-pay | Admitting: Psychiatry

## 2021-10-15 ENCOUNTER — Telehealth: Payer: Self-pay | Admitting: Psychiatry

## 2021-10-15 NOTE — Telephone Encounter (Signed)
Patient was fired from work and husband is looking for documentation regarding her condition so they can file for disability. Patients husband would like you to all him and he is on the [facility]. He is currently going to work with her primary care to file for disability. Thank you

## 2021-10-16 NOTE — Telephone Encounter (Signed)
LVM informing husband patient needs to sign a release to ger her records from our office. PCP to complete disability paperwork. Left # for questions.

## 2021-10-16 NOTE — Telephone Encounter (Signed)
Agreed, we can sent our notes if she consents. PCP will have to do the disability

## 2021-11-20 ENCOUNTER — Encounter: Payer: Self-pay | Admitting: Psychiatry

## 2021-11-20 ENCOUNTER — Ambulatory Visit (INDEPENDENT_AMBULATORY_CARE_PROVIDER_SITE_OTHER): Payer: PRIVATE HEALTH INSURANCE | Admitting: Psychiatry

## 2021-11-20 VITALS — BP 141/78 | HR 73 | Ht 62.0 in | Wt 151.5 lb

## 2021-11-20 DIAGNOSIS — R413 Other amnesia: Secondary | ICD-10-CM

## 2021-11-20 MED ORDER — MEMANTINE HCL 10 MG PO TABS: 10.0000 mg | ORAL_TABLET | Freq: Two times a day (BID) | ORAL | 5 refills | Status: DC

## 2021-11-20 MED ORDER — MEMANTINE HCL 5 MG PO TABS: ORAL_TABLET | ORAL | 0 refills | Status: DC

## 2021-11-20 NOTE — Telephone Encounter (Signed)
Can you check on these referrals please?

## 2021-11-20 NOTE — Telephone Encounter (Signed)
I've ordered an MRI twice, once in June and again in August. I'm not sure why it hasn't been done yet. Has she ever received a call to schedule this?   Can we also check to see if she ever got scheduled with neuropsych? I placed that referral back in August as well

## 2021-11-20 NOTE — Patient Instructions (Addendum)
Start Namenda (this is a medication for your memory)  week one:  take (5mg ) in the morning   week two:  take (5mg ) in the morning and take (5mg ) in the evening  week three: take (10mg ) in the morning and take (5mg ) in the evening  week four:  take (10mg ) in the morning and take (10mg ) in the evening   -this is the full dose  Continue donepezil  You should receive a call to schedule an MRI of the brain, neuropsychological testing (memory testing), and an appointment with our Sleep team. Please let me know if you don't receive these phone calls

## 2021-11-20 NOTE — Telephone Encounter (Signed)
FYI

## 2021-11-20 NOTE — Progress Notes (Signed)
   CC:  memory loss  Follow-up Visit  Last visit:  08/27/21  Brief HPI: 56 year old female with a history of HTN who follows in clinic for memory loss.   At her last visit, brain MRI was ordered. She was referred to neuropsychology. Referral to sleep was placed to assess for OSA. She was started on donepezil for memory loss.  Interval History: She has personally not noticed any difference in her memory, however her family states she has continued to decline. She has not noticed any improvement in her memory since starting donepezil. She repeats the same questions within minutes of each other. Will forget appointments. Has left the stove on multiple times. Anxiety seems worse as well, and she has been crying daily. She has been more irritable toward her family.   Physical Exam:   Vital Signs: BP (!) 141/78   Pulse 73   Ht 5\' 2"  (1.575 m)   Wt 151 lb 8 oz (68.7 kg)   BMI 27.71 kg/m  GENERAL:  well appearing, in no acute distress, alert  SKIN:  Color, texture, turgor normal. No rashes or lesions HEAD:  Normocephalic/atraumatic. RESP: normal respiratory effort MSK:  No gross joint deformities.   NEUROLOGICAL: Mental Status: Alert, oriented to person, place, and time. Repeat herself multiple times. Is slightly irritable, referring to her husband and daughter as her "parents" Follows commands, and Speech fluent and appropriate. Cranial Nerves: PERRL, face symmetric, no dysarthria, hearing grossly intact Motor: moves all extremities equally Gait: normal-based.  IMPRESSION: 56 year old female with a history of HTN who presents for follow up of memory loss. Her family feels she has continued to decline since her last visit 3 months ago. She has not noticed any difference with donepezil. Will add Namenda for her memory. Brain MRI, neuropsychological testing, and Sleep appointment were never scheduled. On discussion with family, it appears patient had silenced all calls and never called  back for her referrals or testing. Obtained daughter's phone number today and will work on getting these tests scheduled.  PLAN: -MRI brain -Neuropsychological testing -Referral to Sleep team to assess for OSA -Continue donepezil 10 mg daily -Start Namenda :  week one:  take (5mg ) in the morning   week two:  take (5mg ) in the morning and take (5mg ) in the evening  week three: take (10mg ) in the morning and take (5mg ) in the evening  week four:  take (10mg ) in the morning and take (10mg ) in the evening, continue on this dose -Next steps: consider SSRI for her anxiety and stress  Follow-up: 6 months  I spent a total of 43 minutes on the date of the service. Discussed medication side effects, adverse reactions and drug interactions. Written educational materials and patient instructions outlining all of the above were given.  57, MD 11/20/21 3:56 PM

## 2021-11-20 NOTE — Telephone Encounter (Signed)
FYI she sent two messages.

## 2021-12-06 ENCOUNTER — Encounter (INDEPENDENT_AMBULATORY_CARE_PROVIDER_SITE_OTHER): Payer: PRIVATE HEALTH INSURANCE | Admitting: Psychiatry

## 2021-12-06 DIAGNOSIS — F0390 Unspecified dementia without behavioral disturbance: Secondary | ICD-10-CM

## 2021-12-06 NOTE — Telephone Encounter (Signed)
What are your suggestions for Namenda and Donepezil ? He is giving her both in the morning. He should be on week four but I dont think he is actually giving it to her as prescribed. She is taking 5MG  of Donepezil in the morning and 10 MG of Namenda in the morning as well.  Directions for the donepezil are to be giving at night but you advised to take with breakfast at initial visit.

## 2021-12-06 NOTE — Telephone Encounter (Signed)
Please see the MyChart message reply(ies) for my assessment and plan.    This patient gave consent for this Medical Advice Message and is aware that it may result in a bill to [employer], as well as the possibility of receiving a bill for a co-payment or deductible. They are an established patient, but are not seeking medical advice exclusively about a problem treated during an in person or video visit in the last seven days. I did not recommend an in person or video visit within seven days of my reply.    I spent a total of 5 minutes cumulative time within 7 days through [employer].  Ocie Doyne, MD  12/06/21 1:25 PM

## 2021-12-17 ENCOUNTER — Encounter: Payer: Self-pay | Admitting: Gastroenterology

## 2021-12-17 ENCOUNTER — Ambulatory Visit (INDEPENDENT_AMBULATORY_CARE_PROVIDER_SITE_OTHER): Payer: Self-pay | Admitting: Gastroenterology

## 2021-12-17 VITALS — BP 131/78 | HR 60 | Temp 97.6°F | Ht 61.0 in | Wt 150.2 lb

## 2021-12-17 DIAGNOSIS — K219 Gastro-esophageal reflux disease without esophagitis: Secondary | ICD-10-CM

## 2021-12-17 MED ORDER — PANTOPRAZOLE SODIUM 40 MG PO TBEC: 40.0000 mg | DELAYED_RELEASE_TABLET | Freq: Every day | ORAL | 11 refills | Status: DC

## 2021-12-17 NOTE — Progress Notes (Signed)
GI Office Note    Referring Provider: Mirna Mires, MD Primary Care Physician:  Mirna Mires, MD  Primary Gastroenterologist: Hennie Duos. Marletta Lor, DO   Chief Complaint   Chief Complaint  Patient presents with   Gastroesophageal Reflux    Has issues with burning in her throat all the time.    History of Present Illness   Adriana Ramsey is a 56 y.o. female presenting today for further evaluation of GERD. Last seen in 2021. She has h/o fatty liver, elevated LFTs previously but normalized in 2020(previous Hep B and C negative), GERD, remote PUD, IBS-C.   Patient states she has had burning in the throat area for months. Feels like she is on fire. Spitting up phlegm. No vomiting. Appetite ok. BM regular. No melena, brbpr. No dysphagia. Has been off omeprazole for many months.   Colonoscopy 03/2016 with mild diverticulosis, external/internal hemorrhoids. Due for repeat colonoscopy in 2028.   EGD 04/2019 with moderate Schatzki ring s/p dilation.   Medications   Current Outpatient Medications  Medication Sig Dispense Refill   amLODipine (NORVASC) 5 MG tablet Take 5 mg by mouth daily.     atorvastatin (LIPITOR) 10 MG tablet Take 10 mg by mouth daily.     donepezil (ARICEPT) 5 MG tablet TAKE 1 TABLET BY MOUTH EVERYDAY AT BEDTIME 30 tablet 3   EPINEPHrine (EPI-PEN) 0.3 mg/0.3 mL DEVI Inject 0.3 mLs (0.3 mg total) into the muscle once. 2 Device 1   hydrochlorothiazide (HYDRODIURIL) 25 MG tablet Take 1 tablet (25 mg total) daily by mouth. 90 tablet 2   [START ON 12/20/2021] memantine (NAMENDA) 10 MG tablet Take 1 tablet (10 mg total) by mouth 2 (two) times daily. 60 tablet 5   omeprazole (PRILOSEC) 40 MG capsule Take 40 mg by mouth daily.     No current facility-administered medications for this visit.    Allergies   Allergies as of 12/17/2021 - Review Complete 12/17/2021  Allergen Reaction Noted   Elemental sulfur Swelling 07/17/2011   Ibuprofen Itching 09/22/2013    Penicillins Hives and Swelling 05/01/2012       Review of Systems   General: Negative for anorexia, weight loss, fever, chills, fatigue, weakness. ENT: Negative for hoarseness, difficulty swallowing , nasal congestion. CV: Negative for chest pain, angina, palpitations, dyspnea on exertion, peripheral edema.  Respiratory: Negative for dyspnea at rest, dyspnea on exertion, cough, sputum, wheezing.  GI: See history of present illness. GU:  Negative for dysuria, hematuria, urinary incontinence, urinary frequency, nocturnal urination.  Endo: Negative for unusual weight change.     Physical Exam   BP 131/78 (BP Location: Right Arm, Patient Position: Sitting, Cuff Size: Normal)   Pulse 60   Temp 97.6 F (36.4 C) (Oral)   Ht 5\' 1"  (1.549 m)   Wt 150 lb 3.2 oz (68.1 kg)   SpO2 98%   BMI 28.38 kg/m    General: Well-nourished, well-developed in no acute distress.  Eyes: No icterus. Mouth: Oropharyngeal mucosa moist and pink , no lesions erythema or exudate. Lungs: Clear to auscultation bilaterally.  Heart: Regular rate and rhythm, no murmurs rubs or gallops.  Abdomen: Bowel sounds are normal, nontender, nondistended, no hepatosplenomegaly or masses,  no abdominal bruits or hernia , no rebound or guarding.  Rectal: not performed Extremities: No lower extremity edema. No clubbing or deformities. Neuro: Alert and oriented x 4   Skin: Warm and dry, no jaundice.   Psych: Alert and cooperative, normal mood and affect.  Labs   Lab Results  Component Value Date   VITAMINB12 472 06/15/2021    Imaging Studies   No results found.  Assessment   GERD: reflux symptoms off PPI. She is not sure when she came off omeprazole. Will restart PPI, switch as she feels omeprazole once not as effective as it used to be.   Fatty liver: Would recommend ongoing LFTs twice yearly by PCP. Continue to work on weight and exercise.    PLAN   Start pantoprazole 40mg  daily before breakfast.  Call if  symptoms not better in 2 weeks. Return ov in 4-6 weeks.    . Leanna Battles, MHS, PA-C [facility]

## 2021-12-17 NOTE — Patient Instructions (Signed)
Start pantoprazole 40mg  daily before breakfast.  Call if your symptoms have not improved in the next 2-3 weeks.  Otherwise we will see you back for follow up in 4-6 weeks.

## 2021-12-19 ENCOUNTER — Ambulatory Visit: Payer: BC Managed Care – PPO | Admitting: Psychiatry

## 2021-12-19 ENCOUNTER — Encounter: Payer: Self-pay | Admitting: Psychiatry

## 2021-12-19 NOTE — Telephone Encounter (Signed)
Can you assist with this ? 

## 2021-12-22 ENCOUNTER — Ambulatory Visit
Admission: RE | Admit: 2021-12-22 | Discharge: 2021-12-22 | Disposition: A | Discharge status: Home / Self Care | Payer: Medicaid - Out of State | Source: Ambulatory Visit | Attending: Psychiatry | Admitting: Psychiatry

## 2021-12-22 DIAGNOSIS — R413 Other amnesia: Secondary | ICD-10-CM

## 2021-12-22 MED ORDER — GADOPICLENOL 0.5 MMOL/ML IV SOLN
7.5000 mL | Freq: Once | INTRAVENOUS | Status: AC | PRN
  Administered 2021-12-22: 7.5 mL via INTRAVENOUS

## 2021-12-24 ENCOUNTER — Encounter: Payer: Self-pay | Admitting: Psychiatry

## 2021-12-25 ENCOUNTER — Telehealth: Payer: Self-pay | Admitting: Psychiatry

## 2021-12-25 NOTE — Telephone Encounter (Signed)
Referral for neuropsychology resent to [facility]. Phone: [phone], Fax: [phone]

## 2021-12-26 ENCOUNTER — Encounter: Payer: Self-pay | Admitting: Psychology

## 2021-12-26 NOTE — Telephone Encounter (Signed)
Received fax from [facility] that they are not in network with her insurance

## 2021-12-26 NOTE — Telephone Encounter (Signed)
Referral for Neuropsychology resent through [employer] to [facility] to see Dr. John Rodenbough. [phone] 

## 2022-01-22 ENCOUNTER — Encounter (HOSPITAL_COMMUNITY): Payer: Self-pay

## 2022-01-22 ENCOUNTER — Emergency Department (HOSPITAL_COMMUNITY): Payer: Medicaid - Out of State

## 2022-01-22 ENCOUNTER — Other Ambulatory Visit: Payer: Self-pay

## 2022-01-22 ENCOUNTER — Emergency Department (HOSPITAL_COMMUNITY)
Admission: EM | Admit: 2022-01-22 | Discharge: 2022-01-22 | Disposition: A | Discharge status: Home / Self Care | Payer: Medicaid - Out of State | Attending: Emergency Medicine | Admitting: Emergency Medicine

## 2022-01-22 DIAGNOSIS — Z79899 Other long term (current) drug therapy: Secondary | ICD-10-CM | POA: Insufficient documentation

## 2022-01-22 DIAGNOSIS — R079 Chest pain, unspecified: Secondary | ICD-10-CM

## 2022-01-22 DIAGNOSIS — E876 Hypokalemia: Secondary | ICD-10-CM | POA: Diagnosis not present

## 2022-01-22 DIAGNOSIS — I10 Essential (primary) hypertension: Secondary | ICD-10-CM | POA: Diagnosis not present

## 2022-01-22 LAB — CBC
HCT: 44.9 % (ref 36.0–46.0)
Hemoglobin: 14.6 g/dL (ref 12.0–15.0)
MCH: 27.6 pg (ref 26.0–34.0)
MCHC: 32.5 g/dL (ref 30.0–36.0)
MCV: 84.9 fL (ref 80.0–100.0)
Platelets: 324 10*3/uL (ref 150–400)
RBC: 5.29 MIL/uL — ABNORMAL HIGH (ref 3.87–5.11)
RDW: 12.1 % (ref 11.5–15.5)
WBC: 3.8 10*3/uL — ABNORMAL LOW (ref 4.0–10.5)
nRBC: 0 % (ref 0.0–0.2)

## 2022-01-22 LAB — BASIC METABOLIC PANEL WITH GFR
Anion gap: 9 (ref 5–15)
BUN: 11 mg/dL (ref 6–20)
CO2: 30 mmol/L (ref 22–32)
Calcium: 9.7 mg/dL (ref 8.9–10.3)
Chloride: 97 mmol/L — ABNORMAL LOW (ref 98–111)
Creatinine, Ser: 0.71 mg/dL (ref 0.44–1.00)
GFR, Estimated: >60 mL/min
Glucose, Bld: 93 mg/dL (ref 70–99)
Potassium: 2.8 mmol/L — ABNORMAL LOW (ref 3.5–5.1)
Sodium: 136 mmol/L (ref 135–145)

## 2022-01-22 LAB — TROPONIN I (HIGH SENSITIVITY)
Troponin I (High Sensitivity): 17 ng/L (ref <18)
Troponin I (High Sensitivity): 16 ng/L (ref <18)

## 2022-01-22 MED ORDER — POTASSIUM CHLORIDE CRYS ER 20 MEQ PO TBCR: 20.0000 meq | EXTENDED_RELEASE_TABLET | Freq: Two times a day (BID) | ORAL | 0 refills | Status: DC

## 2022-01-22 MED ORDER — POTASSIUM CHLORIDE CRYS ER 20 MEQ PO TBCR
40.0000 meq | EXTENDED_RELEASE_TABLET | Freq: Once | ORAL | Status: AC
  Administered 2022-01-22: 40 meq via ORAL
  Filled 2022-01-22: qty 2

## 2022-01-22 MED ORDER — POTASSIUM CHLORIDE 10 MEQ/100ML IV SOLN
10.0000 meq | INTRAVENOUS | Status: AC
  Administered 2022-01-22 (×2): 10 meq via INTRAVENOUS
  Filled 2022-01-22 (×2): qty 100

## 2022-01-22 MED ORDER — ALUM & MAG HYDROXIDE-SIMETH 200-200-20 MG/5ML PO SUSP
30.0000 mL | Freq: Once | ORAL | Status: AC
  Administered 2022-01-22: 30 mL via ORAL
  Filled 2022-01-22: qty 30

## 2022-01-22 NOTE — ED Provider Notes (Signed)
[facility] EMERGENCY DEPARTMENT Provider Note   CSN: [id number] Arrival date & time: 01/22/22  1430     History  Chief Complaint  Patient presents with   Chest Pain    Adriana Ramsey is a 57 y.o. female.   Chest Pain Associated symptoms: no abdominal pain, no back pain, no cough, no dizziness, no fever, no headache, no nausea, no shortness of breath, no vomiting and no weakness        Adriana Ramsey is a 57 y.o. female with past medical history of hypertension, GERD, peptic ulcer disease, who presents to the Emergency Department complaining of chest pain and burning sensation of her throat and chest.  Symptoms present for 2 days.  She describes mild pain to the mid upper chest, pain is nonradiating.  No associated shortness of breath arm neck or jaw pain.  She also endorses some spitting up of clear fluid that she states burns her throat.  She does not currently take any antacids or PPIs.  She denies any abdominal pain, nausea, or vomiting.  Denies any excessive NSAID use  Home Medications Prior to Admission medications   Medication Sig Start Date End Date Taking? Authorizing Provider  amLODipine (NORVASC) 5 MG tablet Take 5 mg by mouth daily. 11/05/21   [provider]  atorvastatin (LIPITOR) 10 MG tablet Take 10 mg by mouth daily.    [provider]  donepezil (ARICEPT) 5 MG tablet TAKE 1 TABLET BY MOUTH EVERYDAY AT BEDTIME 09/25/21   Ocie Doyne, MD  EPINEPHrine (EPI-PEN) 0.3 mg/0.3 mL DEVI Inject 0.3 mLs (0.3 mg total) into the muscle once. 10/11/11   Salley Scarlet, MD  hydrochlorothiazide (HYDRODIURIL) 25 MG tablet Take 1 tablet (25 mg total) daily by mouth. 11/19/16   [place], Velna Hatchet, MD  memantine (NAMENDA) 10 MG tablet Take 1 tablet (10 mg total) by mouth 2 (two) times daily. 12/20/21   Ocie Doyne, MD  pantoprazole (PROTONIX) 40 MG tablet Take 1 tablet (40 mg total) by mouth daily before breakfast. 12/17/21   Tiffany Kocher,  PA-C      Allergies    Elemental sulfur, Ibuprofen, and Penicillins    Review of Systems   Review of Systems  Constitutional:  Negative for appetite change, chills and fever.  HENT:  Negative for congestion and sore throat.   Respiratory:  Negative for cough and shortness of breath.   Cardiovascular:  Positive for chest pain.  Gastrointestinal:  Negative for abdominal pain, diarrhea, nausea and vomiting.  Genitourinary:  Negative for dysuria.  Musculoskeletal:  Negative for back pain and neck pain.  Neurological:  Negative for dizziness, weakness and headaches.  Psychiatric/Behavioral:  Negative for confusion.     Physical Exam Updated Vital Signs BP 133/72 (BP Location: Right Arm)   Pulse 77   Temp 97.9 F (36.6 C) (Oral)   Resp 18   Ht 5' (1.524 m)   Wt 65.6 kg   SpO2 100%   BMI 28.23 kg/m  Physical Exam Vitals and nursing note reviewed.  Constitutional:      General: She is not in acute distress.    Appearance: Normal appearance. She is not ill-appearing or toxic-appearing.  HENT:     Mouth/Throat:     Mouth: Mucous membranes are moist.     Pharynx: Oropharynx is clear. No posterior oropharyngeal erythema.  Cardiovascular:     Rate and Rhythm: Normal rate and regular rhythm.     Pulses: Normal pulses.  Pulmonary:     Effort: Pulmonary effort is normal. No respiratory distress.     Breath sounds: No wheezing.  Chest:     Chest wall: No tenderness.  Abdominal:     General: There is no distension.     Palpations: Abdomen is soft.     Tenderness: There is no abdominal tenderness.  Musculoskeletal:        General: Normal range of motion.  Skin:    General: Skin is warm.     Capillary Refill: Capillary refill takes less than 2 seconds.  Neurological:     General: No focal deficit present.     Mental Status: She is alert.     Sensory: No sensory deficit.     Motor: No weakness.     ED Results / Procedures / Treatments   Labs (all labs ordered are listed,  but only abnormal results are displayed) Labs Reviewed  BASIC METABOLIC PANEL - Abnormal; Notable for the following components:      Result Value   Potassium 2.8 (*)    Chloride 97 (*)    All other components within normal limits  CBC - Abnormal; Notable for the following components:   WBC 3.8 (*)    RBC 5.29 (*)    All other components within normal limits  TROPONIN I (HIGH SENSITIVITY)    EKG EKG Interpretation  Date/Time:  Tuesday January 22 2022 15:33:21 EST Ventricular Rate:  62 PR Interval:  158 QRS Duration: 78 QT Interval:  424 QTC Calculation: 430 R Axis:   33 Text Interpretation: Normal sinus rhythm Nonspecific T wave abnormality Abnormal ECG Confirmed by Godfrey Pick [phone]) on 01/22/2022 9:04:57 PM  Radiology DG Chest 2 View  Result Date: 01/22/2022 CLINICAL DATA:  Chest pain for 2 days. EXAM: CHEST - 2 VIEW COMPARISON:  11/22/2009 FINDINGS: The cardiac silhouette, mediastinal and hilar contours are within normal limits and stable. The lungs are clear. No pleural effusions. No pulmonary lesions. The bony thorax is intact. IMPRESSION: No acute cardiopulmonary findings. Electronically Signed   By: Marijo Sanes M.D.   On: 01/22/2022 15:56    Procedures Procedures    Medications Ordered in ED Medications  alum & mag hydroxide-simeth (MAALOX/MYLANTA) 200-200-20 MG/5ML suspension 30 mL (has no administration in time range)    ED Course/ Medical Decision Making/ A&P                             Medical Decision Making Patient here for evaluation of 2-day history of burning chest pain and "spitting up" clear fluid that burns her throat.  Does not currently take antacids or PPIs.  No prior history of cardiac issues.  On exam, patient well-appearing nontoxic.  Vital signs reassuring.  Abdomen soft nontender   Clinically, suspect this is related to acid reflux, ACS also considered but felt less likely given nature of her symptoms.  Will check labs, including troponin  EKG and chest x-ray.  Will also provide GI cocktail and reassess.  Amount and/or Complexity of Data Reviewed Labs: ordered.    Details: Labs interpreted by me, no evidence of leukocytosis, hemoglobin unremarkable.  Chemistries show significant hypokalemia with potassium of 2.8.  Patient is on a diuretic this is the likely cause.  Delta troponin remains flat Radiology: ordered.    Details: Chest x-ray without acute cardiopulmonary process ECG/medicine tests: ordered.    Details: EKG shows sinus rhythm with nonspecific T wave abnormality. Discussion of  management or test interpretation with external provider(s): Patient's workup today without evidence of ACS.  Chest pain likely secondary to GERD.  She takes pantoprazole once daily will have her increase to twice daily for 1 week.  Also, found to be hypokalemic today she does take diuretic which is likely source of her hypokalemia.  She was given oral and IV potassium along with prescription for potassium x 5 days.  She is agreeable to close outpatient follow-up with PCP for recheck of her potassium in 1 week.  Also provided ambulatory referral for cardiology.  Risk OTC drugs. Prescription drug management.   HEART score low=  2        Final Clinical Impression(s) / ED Diagnoses Final diagnoses:  Nonspecific chest pain  Hypokalemia    Rx / DC Orders ED Discharge Orders     None         Kem Parkinson, PA-C 01/25/22 1444    Godfrey Pick, MD 01/30/22 [phone]

## 2022-01-22 NOTE — Discharge Instructions (Signed)
Your workup today shows that your potassium level was very low.  You have been given potassium here in the ER and a prescription for potassium to take for the next 5 days.  You will need to have your potassium level rechecked in 1 week.  Please call your primary doctor to arrange follow-up of this as he can arrange to have your potassium level rechecked.  Also, I recommend that you take your pantoprazole twice daily for 7 days.  You have been given referral information to the local cardiology group, someone from their office will be contacting you to arrange follow-up appointment.  Return to the emergency department for any new or worsening symptoms.

## 2022-01-22 NOTE — ED Triage Notes (Signed)
Pt presents to ED with complaints of burning in her throat and pain in her chest x 2 days.

## 2022-01-29 ENCOUNTER — Ambulatory Visit: Payer: Self-pay | Admitting: Gastroenterology

## 2022-01-29 NOTE — Progress Notes (Deleted)
GI Office Note    Referring Provider: Iona Beard, MD Primary Care Physician:  Iona Beard, MD  Primary Gastroenterologist:  Chief Complaint   No chief complaint on file.   History of Present Illness   Adriana Ramsey is a 57 y.o. female presenting today          Medications   Current Outpatient Medications  Medication Sig Dispense Refill   amLODipine (NORVASC) 5 MG tablet Take 5 mg by mouth daily.     atorvastatin (LIPITOR) 10 MG tablet Take 10 mg by mouth daily.     donepezil (ARICEPT) 5 MG tablet TAKE 1 TABLET BY MOUTH EVERYDAY AT BEDTIME 30 tablet 3   EPINEPHrine (EPI-PEN) 0.3 mg/0.3 mL DEVI Inject 0.3 mLs (0.3 mg total) into the muscle once. 2 Device 1   hydrochlorothiazide (HYDRODIURIL) 25 MG tablet Take 1 tablet (25 mg total) daily by mouth. 90 tablet 2   memantine (NAMENDA) 10 MG tablet Take 1 tablet (10 mg total) by mouth 2 (two) times daily. 60 tablet 5   pantoprazole (PROTONIX) 40 MG tablet Take 1 tablet (40 mg total) by mouth daily before breakfast. 30 tablet 11   potassium chloride SA (KLOR-CON M) 20 MEQ tablet Take 1 tablet (20 mEq total) by mouth 2 (two) times daily. 10 tablet 0   No current facility-administered medications for this visit.    Allergies   Allergies as of 01/29/2022 - Review Complete 01/22/2022  Allergen Reaction Noted   Elemental sulfur Swelling 07/17/2011   Ibuprofen Itching 09/22/2013   Penicillins Hives and Swelling 05/01/2012     Past Medical History   Past Medical History:  Diagnosis Date   Essential hypertension    Fatty liver    Gallbladder polyp 2012   Last ultrasound 2015, no evidence of gallbladder polyp   GERD (gastroesophageal reflux disease)    Memory loss    PUD (peptic ulcer disease) 2011   Seasonal allergies     Past Surgical History   Past Surgical History:  Procedure Laterality Date   BREAST REDUCTION SURGERY  2007   CESAREAN SECTION     2   COLONOSCOPY N/A 03/14/2016   Procedure:  COLONOSCOPY;  Surgeon: Danie Binder, MD;  mild diverticulosis in the sigmoid and descending colon, redundant rectosigmoid colon, external and internal hemorrhoids.  Due for repeat colonoscopy in 2028.    ESOPHAGOGASTRODUODENOSCOPY  11/23/2009   NUR: hypopharyngeal erosions of unclear sidnificance/mild changes of reflux /small sliding hiatal hernia and non Schatzki ring/3-mm gastric ulcer along the antrum with 2 scars and few erosions. h.pyori serologies negative   ESOPHAGOGASTRODUODENOSCOPY N/A 04/21/2019   Procedure: ESOPHAGOGASTRODUODENOSCOPY (EGD);  Surgeon: Danie Binder, MD;  Moderate Schatzki's ring s/p dilation, small hiatal hernia, otherwise normal exam.   PARTIAL HYSTERECTOMY     REDUCTION MAMMAPLASTY Bilateral    SAVORY DILATION N/A 04/21/2019   Procedure: SAVORY DILATION;  Surgeon: Danie Binder, MD;  Location: [facility];  Service: Endoscopy;  Laterality: N/A;    Past Family History   Family History  Problem Relation Age of Onset   Hyperlipidemia Mother    Seizures Mother    Hypertension Father    Heart attack Maternal Grandfather        29   Colon cancer Neg Hx    Liver disease Neg Hx     Past Social History   Social History   Socioeconomic History   Marital status: Married    Spouse name: Not on  file   Number of children: 2   Years of education: Not on file   Highest education level: Some college, no degree  Occupational History   Occupation: Engineer, technical sales    Employer: [employer]  Tobacco Use   Smoking status: Never   Smokeless tobacco: Never  Vaping Use   Vaping Use: Never used  Substance and Sexual Activity   Alcohol use: Yes    Comment: wine 1 glass per week   Drug use: No   Sexual activity: Yes  Other Topics Concern   Not on file  Social History Narrative   Lives w/ husband w/ one of their children.    Right-handed.   Caffeine use: one cup per day.    Social Determinants of Health   Financial Resource Strain: Not on  file  Food Insecurity: Not on file  Transportation Needs: Not on file  Physical Activity: Not on file  Stress: Not on file  Social Connections: Not on file  Intimate Partner Violence: Not on file    Review of Systems   General: Negative for anorexia, weight loss, fever, chills, fatigue, weakness. ENT: Negative for hoarseness, difficulty swallowing , nasal congestion. CV: Negative for chest pain, angina, palpitations, dyspnea on exertion, peripheral edema.  Respiratory: Negative for dyspnea at rest, dyspnea on exertion, cough, sputum, wheezing.  GI: See history of present illness. GU:  Negative for dysuria, hematuria, urinary incontinence, urinary frequency, nocturnal urination.  Endo: Negative for unusual weight change.     Physical Exam   There were no vitals taken for this visit.   General: Well-nourished, well-developed in no acute distress.  Eyes: No icterus. Mouth: Oropharyngeal mucosa moist and pink , no lesions erythema or exudate. Lungs: Clear to auscultation bilaterally.  Heart: Regular rate and rhythm, no murmurs rubs or gallops.  Abdomen: Bowel sounds are normal, nontender, nondistended, no hepatosplenomegaly or masses,  no abdominal bruits or hernia , no rebound or guarding.  Rectal: ***  Extremities: No lower extremity edema. No clubbing or deformities. Neuro: Alert and oriented x 4   Skin: Warm and dry, no jaundice.   Psych: Alert and cooperative, normal mood and affect.  Labs   Lab Results  Component Value Date   WBC 3.8 (L) 01/22/2022   HGB 14.6 01/22/2022   HCT 44.9 01/22/2022   MCV 84.9 01/22/2022   PLT 324 01/22/2022   Lab Results  Component Value Date   CREATININE 0.71 01/22/2022   BUN 11 01/22/2022   NA 136 01/22/2022   K 2.8 (L) 01/22/2022   CL 97 (L) 01/22/2022   CO2 30 01/22/2022   Lab Results  Component Value Date   VITAMINB12 472 06/15/2021    Imaging Studies   DG Chest 2 View  Result Date: 01/22/2022 CLINICAL DATA:  Chest pain  for 2 days. EXAM: CHEST - 2 VIEW COMPARISON:  11/22/2009 FINDINGS: The cardiac silhouette, mediastinal and hilar contours are within normal limits and stable. The lungs are clear. No pleural effusions. No pulmonary lesions. The bony thorax is intact. IMPRESSION: No acute cardiopulmonary findings. Electronically Signed   By: Marijo Sanes M.D.   On: 01/22/2022 15:56    Assessment       PLAN   ***   Laureen Ochs. Bobby Rumpf, [place], [facility]

## 2022-02-02 ENCOUNTER — Other Ambulatory Visit: Payer: Self-pay | Admitting: Psychiatry

## 2022-04-22 ENCOUNTER — Other Ambulatory Visit: Payer: Self-pay | Admitting: Psychiatry

## 2022-04-22 ENCOUNTER — Encounter: Payer: Self-pay | Admitting: Psychiatry

## 2022-04-22 MED ORDER — CITALOPRAM HYDROBROMIDE 10 MG PO TABS: 10.0000 mg | ORAL_TABLET | Freq: Every day | ORAL | 6 refills | Status: DC

## 2022-04-22 NOTE — Telephone Encounter (Signed)
I sent a prescription for a low dose of citalopram to her pharmacy. She can start taking this now and then follow up at her appointment with Maralyn Sago next month.

## 2022-04-24 ENCOUNTER — Telehealth: Payer: Self-pay | Admitting: Psychiatry

## 2022-04-24 MED ORDER — DONEPEZIL HCL 5 MG PO TABS: ORAL_TABLET | ORAL | 3 refills | Status: DC

## 2022-04-24 NOTE — Telephone Encounter (Signed)
Called husband back. He wanted to clarify how wife should be taking medication.  He recently got a refill of memantine ,  TAKE 1 TABLET BY MOUTH EVERY MORNING FOR 1 WEEK, THEN 1 TAB TWICE A DAY FOR 1 WEEK, THEN 2 TABS IN THE MORNING AND 1 TAB IN THE EVENING FOR 1 WEEK, THEN 2 TABS TWICE DAILY .   However, she has been taking memantine  maintenance. Instructed she should continue on this dose.  rx called in by mistake. Did advised she could take 2-5mg  tablets po BID to equal dose so they do not have to waste those tablets.   Confirmed she should be on donepezil  po qd as well.   He asked if citalopram  po qd is new med called in by Dr. Delena Bali. Confirmed this is the new med called in.

## 2022-04-24 NOTE — Telephone Encounter (Signed)
Pt husband called. Stated he needs to talk to nurse about pt mental state.

## 2022-05-16 ENCOUNTER — Other Ambulatory Visit: Payer: Self-pay | Admitting: Psychiatry

## 2022-06-05 ENCOUNTER — Ambulatory Visit (INDEPENDENT_AMBULATORY_CARE_PROVIDER_SITE_OTHER): Payer: Medicaid - Out of State | Admitting: Neurology

## 2022-06-05 ENCOUNTER — Encounter: Payer: Self-pay | Admitting: Neurology

## 2022-06-05 VITALS — BP 123/75 | HR 67 | Ht 60.0 in | Wt 144.5 lb

## 2022-06-05 DIAGNOSIS — F03918 Unspecified dementia, unspecified severity, with other behavioral disturbance: Secondary | ICD-10-CM

## 2022-06-05 DIAGNOSIS — F39 Unspecified mood [affective] disorder: Secondary | ICD-10-CM

## 2022-06-05 DIAGNOSIS — R413 Other amnesia: Secondary | ICD-10-CM

## 2022-06-05 DIAGNOSIS — F0390 Unspecified dementia without behavioral disturbance: Secondary | ICD-10-CM | POA: Diagnosis not present

## 2022-06-05 MED ORDER — DONEPEZIL HCL 10 MG PO TABS: 10.0000 mg | ORAL_TABLET | Freq: Every day | ORAL | 1 refills | Status: DC

## 2022-06-05 MED ORDER — CITALOPRAM HYDROBROMIDE 20 MG PO TABS: 20.0000 mg | ORAL_TABLET | Freq: Every day | ORAL | 5 refills | Status: DC

## 2022-06-05 NOTE — Progress Notes (Signed)
Patient: Adriana Ramsey Want Date of Birth: October 17, 1965  Reason for Visit: Follow up History from: Patient, husband, [name].  Primary Neurologist: [facility]  ASSESSMENT AND PLAN 57 y.o. year old female   1.  Memory loss with mood/behavioral disturbance  -Some decline in memory since last seen, today MoCA 20/30, experiencing emotional upset, crying spells, husband has taken a leave of absence from work to help her -Scheduled for neuropsychological evaluation in August with Dr. Kieth Brightly -Increase Celexa 20 mg daily -Unclear why Aricept was reduced to 5 mg, will increase back up to 10 mg daily, continue Namenda 10 mg twice daily for the memory -Referral for sleep consult given memory issues, history of snoring -Referral to psychiatry for mood management -I have recommended against driving.  I have also provided information on drivers rehab for an evaluation -Apparently underwent neuropsychological evaluation through disability office, the daughter will look for this information and I can try to request the records -Follow-up in 6 months or sooner if needed  Orders Placed This Encounter  Procedures   Ambulatory referral to Psychiatry   Ambulatory referral to Sleep Studies   Meds ordered this encounter  Medications   citalopram (CELEXA) 20 MG tablet    Sig: Take 1 tablet (20 mg total) by mouth daily.    Dispense:  30 tablet    Refill:  5   donepezil (ARICEPT) 10 MG tablet    Sig: Take 1 tablet (10 mg total) by mouth at bedtime.    Dispense:  90 tablet    Refill:  1   HISTORY OF PRESENT ILLNESS: Today 06/05/22 MRI of the brain showed minor age-related changes.  Never underwent neuropsychological evaluation or sleep consult.  Had sent MyChart message in April 2024 daughter reporting extreme anxiety, crying.  She was started on Celexa. Apparently her daughter tried to set up her neuro psy testing and she didn't want to go. Didn't want to do sleep study, she snores. MOCA 20/30 today. "  Has her moments" with her memory, because she knows its not as good. Husband manages appointments/medications. She asks the same questions. Her husband has taken some time off due to patient's emotional issues, she was calling him all the time. She does her ADLs, she does laundry, cooking. She is driving, she gets lost. On Aricept 5 mg daily, Namenda 10 mg daily. We are not sure why the Aricept got lowered from 10 to 5. 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 We called her daughter on Face time, [name].   HISTORY  11/20/21 Dr. Delena Bali Brief HPI: 57 year old female with a history of HTN who follows in clinic for memory loss.    At her last visit, brain MRI was ordered. She was referred to neuropsychology. Referral to sleep was placed to assess for OSA. She was started on donepezil for memory loss.   Interval History: She has personally not noticed any difference in her memory, however her family states she has continued to decline. She has not noticed any improvement in her memory since starting donepezil. She repeats the same questions within minutes of each other. Will forget appointments. Has left the stove on multiple times. Anxiety seems worse as well, and she has been crying daily. She has been more irritable toward her family.  REVIEW OF SYSTEMS: Out of a complete 14 system review of symptoms, the patient complains only of the following symptoms, and all other reviewed systems are  negative.  See HPI  ALLERGIES: Allergies  Allergen Reactions   Elemental Sulfur Swelling   Ibuprofen Itching   Penicillins Hives and Swelling    HOME MEDICATIONS: Outpatient Medications Prior to Visit  Medication Sig Dispense Refill   amLODipine (NORVASC) 5 MG tablet Take 5 mg by mouth daily.     atorvastatin (LIPITOR) 10 MG tablet Take 10 mg by mouth daily.     citalopram (CELEXA) 10 MG tablet Take 1 tablet (10 mg total) by mouth daily. 30 tablet 6   donepezil  (ARICEPT) 5 MG tablet TAKE 1 TABLET BY MOUTH EVERYDAY AT BEDTIME 30 tablet 3   hydrochlorothiazide (HYDRODIURIL) 25 MG tablet Take 1 tablet (25 mg total) daily by mouth. 90 tablet 2   memantine (NAMENDA) 10 MG tablet TAKE 1 TABLET BY MOUTH TWICE A DAY (Patient taking differently: Take 10 mg by mouth daily.) 180 tablet 3   EPINEPHrine (EPI-PEN) 0.3 mg/0.3 mL DEVI Inject 0.3 mLs (0.3 mg total) into the muscle once. 2 Device 1   memantine (NAMENDA) 5 MG tablet TAKE 1 TABLET BY MOUTH EVERY MORNING FOR 1 WEEK, THEN 1 TAB TWICE A DAY FOR 1 WEEK, THEN 2 TABS IN THE MORNING AND 1 TAB IN THE EVENING FOR 1 WEEK, THEN 2 TABS TWICE DAILY 210 tablet 0   pantoprazole (PROTONIX) 40 MG tablet Take 1 tablet (40 mg total) by mouth daily before breakfast. 30 tablet 11   potassium chloride SA (KLOR-CON M) 20 MEQ tablet Take 1 tablet (20 mEq total) by mouth 2 (two) times daily. 10 tablet 0   No facility-administered medications prior to visit.    PAST MEDICAL HISTORY: Past Medical History:  Diagnosis Date   Essential hypertension    Fatty liver    Gallbladder polyp 2012   Last ultrasound 2015, no evidence of gallbladder polyp   GERD (gastroesophageal reflux disease)    Memory loss    PUD (peptic ulcer disease) 2011   Seasonal allergies     PAST SURGICAL HISTORY: Past Surgical History:  Procedure Laterality Date   BREAST REDUCTION SURGERY  2007   CESAREAN SECTION     2   COLONOSCOPY N/A 03/14/2016   Procedure: COLONOSCOPY;  Surgeon: West Bali, MD;  mild diverticulosis in the sigmoid and descending colon, redundant rectosigmoid colon, external and internal hemorrhoids.  Due for repeat colonoscopy in 2028.    ESOPHAGOGASTRODUODENOSCOPY  11/23/2009   NUR: hypopharyngeal erosions of unclear sidnificance/mild changes of reflux /small sliding hiatal hernia and non Schatzki ring/3-mm gastric ulcer along the antrum with 2 scars and few erosions. h.pyori serologies negative   ESOPHAGOGASTRODUODENOSCOPY N/A  04/21/2019   Procedure: ESOPHAGOGASTRODUODENOSCOPY (EGD);  Surgeon: West Bali, MD;  Moderate Schatzki's ring s/p dilation, small hiatal hernia, otherwise normal exam.   PARTIAL HYSTERECTOMY     REDUCTION MAMMAPLASTY Bilateral    SAVORY DILATION N/A 04/21/2019   Procedure: SAVORY DILATION;  Surgeon: West Bali, MD;  Location: [facility];  Service: Endoscopy;  Laterality: N/A;    FAMILY HISTORY: Family History  Problem Relation Age of Onset   Hyperlipidemia Mother    Seizures Mother    Hypertension Father    Heart attack Maternal Grandfather        56   Colon cancer Neg Hx    Liver disease Neg Hx     SOCIAL HISTORY: Social History   Socioeconomic History   Marital status: Married    Spouse name: Not on file   Number of children: 2  Years of education: Not on file   Highest education level: Some college, no degree  Occupational History   Occupation: Immunologist    Employer: [employer]  Tobacco Use   Smoking status: Never   Smokeless tobacco: Never  Vaping Use   Vaping Use: Never used  Substance and Sexual Activity   Alcohol use: Yes    Comment: wine 1 glass per week   Drug use: No   Sexual activity: Yes  Other Topics Concern   Not on file  Social History Narrative   Lives w/ husband w/ one of their children.    Right-handed.   Caffeine use: one cup per day.    Social Determinants of Health   Financial Resource Strain: Not on file  Food Insecurity: Not on file  Transportation Needs: Not on file  Physical Activity: Not on file  Stress: Not on file  Social Connections: Not on file  Intimate Partner Violence: Not on file   PHYSICAL EXAM  Vitals:   06/05/22 1402  BP: 123/75  Pulse: 67  Weight: 144 lb 8 oz (65.5 kg)  Height: 5' (1.524 m)   Body mass index is 28.22 kg/m.    06/05/2022    2:05 PM 08/27/2021   10:42 AM 06/15/2021   10:08 AM  Montreal Cognitive Assessment   Visuospatial/ Executive (0/5) 3 2 2   Naming (0/3) 3  3 3   Attention: Read list of digits (0/2) 1 2 2   Attention: Read list of letters (0/1) 1 1 1   Attention: Serial 7 subtraction starting at 100 (0/3) 3 3 3   Language: Repeat phrase (0/2) 2 2 2   Language : Fluency (0/1) 1 1 1   Abstraction (0/2) 1 2 1   Delayed Recall (0/5) 0 0 0  Orientation (0/6) 5 6 5   Total 20 22 20   Adjusted Score (based on education) 20  20   Generalized: Well developed, in no acute distress  Neurological examination  Mentation: Alert oriented to time, place, is a limited historian, provides some inaccurate information.  Seems disinterested, somewhat irritable, few spells of crying. Follows all commands speech and language fluent Cranial nerve II-XII: Pupils were equal round reactive to light. Extraocular movements were full, visual field were full on confrontational test. Facial sensation and strength were normal. Head turning and shoulder shrug  were normal and symmetric. Motor: The motor testing reveals 5 over 5 strength of all 4 extremities. Good symmetric motor tone is noted throughout.  Sensory: Sensory testing is intact to soft touch on all 4 extremities. No evidence of extinction is noted.  Coordination: Cerebellar testing reveals good finger-nose-finger and heel-to-shin bilaterally.  Gait and station: Gait is normal.  Reflexes: Deep tendon reflexes are symmetric and normal bilaterally.   DIAGNOSTIC DATA (LABS, IMAGING, TESTING) - I reviewed patient records, labs, notes, testing and imaging myself where available.  Lab Results  Component Value Date   WBC 3.8 (L) 01/22/2022   HGB 14.6 01/22/2022   HCT 44.9 01/22/2022   MCV 84.9 01/22/2022   PLT 324 01/22/2022      Component Value Date/Time   NA 136 01/22/2022 1559   K 2.8 (L) 01/22/2022 1559   CL 97 (L) 01/22/2022 1559   CO2 30 01/22/2022 1559   GLUCOSE 93 01/22/2022 1559   BUN 11 01/22/2022 1559   CREATININE 0.71 01/22/2022 1559   CREATININE 0.85 03/25/2017 0913   CALCIUM 9.7 01/22/2022 1559    PROT 7.7 03/25/2017 0913   ALBUMIN 4.5 09/06/2016 0848  AST 30 03/25/2017 0913   ALT 32 (H) 03/25/2017 0913   ALKPHOS 59 09/06/2016 0848   BILITOT 0.7 03/25/2017 0913   GFRNONAA >60 01/22/2022 1559   GFRNONAA 78 09/06/2016 0848   GFRAA >89 09/06/2016 0848   Lab Results  Component Value Date   CHOL 189 03/25/2017   HDL 44 (L) 03/25/2017   LDLCALC 117 (H) 03/25/2017   TRIG 165 (H) 03/25/2017   CHOLHDL 4.3 03/25/2017   Lab Results  Component Value Date   HGBA1C 5.8 (H) 02/07/2016   Lab Results  Component Value Date   VITAMINB12 472 06/15/2021   Lab Results  Component Value Date   TSH 1.92 02/07/2016    Margie Ege, AGNP-C, DNP 06/05/2022, 2:33 PM [facility] [address] [place], [place] [address] [phone]

## 2022-06-05 NOTE — Patient Instructions (Addendum)
Keep appointment with Dr. Kieth Brightly in August Increase in Celexa 20 mg daily for the mood Referral to psychiatry and sleep consult  Increase Aricept back up to 10 mg daily, continue Namenda 10 mg BID I do not recommend driving at this point, review paperwork for drivers rehab  See you back in 6 months

## 2022-06-28 ENCOUNTER — Other Ambulatory Visit: Payer: Self-pay | Admitting: Neurology

## 2022-07-10 ENCOUNTER — Ambulatory Visit (INDEPENDENT_AMBULATORY_CARE_PROVIDER_SITE_OTHER): Payer: Medicaid Other | Admitting: Neurology

## 2022-07-10 ENCOUNTER — Encounter: Payer: Self-pay | Admitting: Neurology

## 2022-07-10 VITALS — BP 124/83 | HR 76 | Ht 60.0 in | Wt 145.8 lb

## 2022-07-10 DIAGNOSIS — Z9189 Other specified personal risk factors, not elsewhere classified: Secondary | ICD-10-CM

## 2022-07-10 DIAGNOSIS — R0681 Apnea, not elsewhere classified: Secondary | ICD-10-CM

## 2022-07-10 DIAGNOSIS — G4719 Other hypersomnia: Secondary | ICD-10-CM | POA: Diagnosis not present

## 2022-07-10 DIAGNOSIS — R0683 Snoring: Secondary | ICD-10-CM

## 2022-07-10 DIAGNOSIS — R413 Other amnesia: Secondary | ICD-10-CM

## 2022-07-10 DIAGNOSIS — E663 Overweight: Secondary | ICD-10-CM

## 2022-07-10 DIAGNOSIS — R351 Nocturia: Secondary | ICD-10-CM

## 2022-07-10 NOTE — Patient Instructions (Signed)

## 2022-07-10 NOTE — Progress Notes (Signed)
Subjective:    Patient ID: Adriana Ramsey is a 57 y.o. female.  HPI    Huston Foley, MD, PhD [facility] [address] [address] [place], [place] [address]  Dear Maralyn Sago,  I saw your patient, Adriana Ramsey, upon your kind request in my sleep clinic today for initial consultation of her sleep disorder, in particular, concern for underlying obstructive sleep apnea.  The patient is accompanied by her husband today.  As you know, Adriana Ramsey is a 57 year old female with an underlying medical history of reflux disease, peptic ulcer disease, seasonal allergies, fatty liver, hypertension, memory loss, and overweight state, who reports snoring and some excessive daytime somnolence.  Her Epworth sleepiness score is 8 out of 24, fatigue severity score is 11 out of 63..  I reviewed your office note from 06/05/2022.  She is scheduled to see Dr. Kieth Brightly in August.  She usually takes a nap daily, up to 1 hour, usually in the late morning.  She lives with her husband and 64 year old son.  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  She is a non-smoker and drinks alcohol rarely, caffeine in the form of soda, 1 or 2 cans/day on average.  She has nocturia about once per average night, denies recurrent nocturnal or morning headaches.  Bedtime is generally between 9 and 9:30 PM and rise time around 6 AM.  Her husband wakes her up when he goes to work.  She may go back to sleep in the late morning for up to an hour.  They have no pets in the household.  She does have a TV in her bedroom and it tends to stay on all night, this is primarily her husband's preference or habit.  He has noticed breathing pauses and choking sounds while she is asleep.  Her snoring is variable.  She had a tonsillectomy as a child.  Her Past Medical History Is Significant For: Past Medical History:  Diagnosis Date   Essential hypertension    Fatty liver     Gallbladder polyp 2012   Last ultrasound 2015, no evidence of gallbladder polyp   GERD (gastroesophageal reflux disease)    Memory loss    PUD (peptic ulcer disease) 2011   Seasonal allergies     Her Past Surgical History Is Significant For: Past Surgical History:  Procedure Laterality Date   BREAST REDUCTION SURGERY  2007   CESAREAN SECTION     2   COLONOSCOPY N/A 03/14/2016   Procedure: COLONOSCOPY;  Surgeon: West Bali, MD;  mild diverticulosis in the sigmoid and descending colon, redundant rectosigmoid colon, external and internal hemorrhoids.  Due for repeat colonoscopy in 2028.    ESOPHAGOGASTRODUODENOSCOPY  11/23/2009   NUR: hypopharyngeal erosions of unclear sidnificance/mild changes of reflux /small sliding hiatal hernia and non Schatzki ring/3-mm gastric ulcer along the antrum with 2 scars and few erosions. h.pyori serologies negative   ESOPHAGOGASTRODUODENOSCOPY N/A 04/21/2019   Procedure: ESOPHAGOGASTRODUODENOSCOPY (EGD);  Surgeon: West Bali, MD;  Moderate Schatzki's ring s/p dilation, small hiatal hernia, otherwise normal exam.   PARTIAL HYSTERECTOMY     REDUCTION MAMMAPLASTY Bilateral    SAVORY DILATION N/A 04/21/2019   Procedure: SAVORY DILATION;  Surgeon: West Bali, MD;  Location: [facility];  Service: Endoscopy;  Laterality: N/A;    Her Family History Is Significant For: Family History  Problem Relation Age of Onset   Hyperlipidemia Mother  Seizures Mother    Hypertension Father    Heart attack Maternal Grandfather        67   Colon cancer Neg Hx    Liver disease Neg Hx     Her Social History Is Significant For: Social History   Socioeconomic History   Marital status: Married    Spouse name: Not on file   Number of children: 2   Years of education: Not on file   Highest education level: Some college, no degree  Occupational History   Occupation: Research scientist (medical): [employer]  Tobacco Use   Smoking status:  Never   Smokeless tobacco: Never  Vaping Use   Vaping Use: Never used  Substance and Sexual Activity   Alcohol use: Yes    Comment: wine 1 glass per week   Drug use: No   Sexual activity: Yes  Other Topics Concern   Not on file  Social History Narrative   Lives w/ husband w/ one of their children.    Right-handed.   Caffeine use: one cup per day.    Social Determinants of Health   Financial Resource Strain: Not on file  Food Insecurity: Not on file  Transportation Needs: Not on file  Physical Activity: Not on file  Stress: Not on file  Social Connections: Not on file    Her Allergies Are:  Allergies  Allergen Reactions   Elemental Sulfur Swelling   Ibuprofen Itching   Penicillins Hives and Swelling  :   Her Current Medications Are:  Outpatient Encounter Medications as of 07/10/2022  Medication Sig   amLODipine (NORVASC) 5 MG tablet Take 5 mg by mouth daily.   atorvastatin (LIPITOR) 10 MG tablet Take 10 mg by mouth daily.   citalopram (CELEXA) 20 MG tablet TAKE 1 TABLET BY MOUTH EVERY DAY   donepezil (ARICEPT) 10 MG tablet Take 1 tablet (10 mg total) by mouth at bedtime.   hydrochlorothiazide (HYDRODIURIL) 25 MG tablet Take 1 tablet (25 mg total) daily by mouth.   memantine (NAMENDA) 10 MG tablet TAKE 1 TABLET BY MOUTH TWICE A DAY (Patient taking differently: Take 10 mg by mouth daily.)   No facility-administered encounter medications on file as of 07/10/2022.  :   Review of Systems:  Out of a complete 14 point review of systems, all are reviewed and negative with the exception of these symptoms as listed below:   Review of Systems  Neurological:        Patient here in room 9, husband in room. Here for sleep consult. Pt reports she snores, no fatigue, no daytime sleepiness. memory concerns, short term memory loss. No recent falls. ESS:8 FSS:11     Objective:  Neurological Exam  Physical Exam Physical Examination:   Vitals:   07/10/22 1111  BP: 124/83   Pulse: 76    General Examination: The patient is a very pleasant 57 y.o. female in no acute distress. She appears well-developed and well-nourished and well groomed.   HEENT: Normocephalic, atraumatic, pupils are equal, round and reactive to light, extraocular tracking is good without limitation to gaze excursion or nystagmus noted. Hearing is grossly intact. Face is symmetric with normal facial animation. Speech is clear with no dysarthria noted. There is no hypophonia. There is no lip, neck/head, jaw or voice tremor. Neck is supple with full range of passive and active motion. There are no carotid bruits on auscultation. Oropharynx exam reveals: mild mouth dryness, adequate dental hygiene and mild  airway crowding, due to small airway entry.  Uvula small and thin.  Tonsils absent, Mallampati class II, no significant overbite, no full occlusion.  Tonsils absent.  Neck circumference 15 inches.  Chest: Clear to auscultation without wheezing, rhonchi or crackles noted.  Heart: S1+S2+0, regular and normal without murmurs, rubs or gallops noted.   Abdomen: Soft, non-tender and non-distended.  Extremities: There is no pitting edema in the distal lower extremities bilaterally.   Skin: Warm and dry without trophic changes noted.   Musculoskeletal: exam reveals no obvious joint deformities.   Neurologically:  Mental status: The patient is awake, alert and able to answer questions but details of her history are provided by her husband.   Speech is clear with normal prosody and enunciation. Thought process is linear. Mood is normal and affect is normal.  Cranial nerves II - XII are as described above under HEENT exam.  Motor exam: Normal bulk, strength and tone is noted. There is no obvious action or resting tremor.  Fine motor skills and coordination: grossly intact.  Cerebellar testing: No dysmetria or intention tremor. There is no truncal or gait ataxia.  Sensory exam: intact to light touch in  the upper and lower extremities.  Gait, station and balance: She stands easily. No veering to one side is noted. No leaning to one side is noted. Posture is age-appropriate and stance is narrow based. Gait shows normal stride length and normal pace. No problems turning are noted.   Assessment and plan:  In summary, Adriana Ramsey is a very pleasant 57 y.o.-year old female with an underlying medical history of reflux disease, peptic ulcer disease, seasonal allergies, fatty liver, hypertension, memory loss, and overweight state, whose history and physical exam are concerning for sleep disordered breathing, particularly obstructive sleep apnea (OSA). A laboratory attended sleep study is typically considered "gold standard" for evaluation of sleep disordered breathing.   I had a long chat with the patient and her husband about my findings and the diagnosis of sleep apnea, particularly OSA, its prognosis and treatment options. We talked about medical/conservative treatments, surgical interventions and non-pharmacological approaches for symptom control. I explained, in particular, the risks and ramifications of untreated moderate to severe OSA, especially with respect to developing cardiovascular disease down the road, including congestive heart failure (CHF), difficult to treat hypertension, cardiac arrhythmias (particularly A-fib), neurovascular complications including TIA, stroke and dementia. Even type 2 diabetes has, in part, been linked to untreated OSA. Symptoms of untreated OSA may include (but may not be limited to) daytime sleepiness, nocturia (i.e. frequent nighttime urination), memory problems, mood irritability and suboptimally controlled or worsening mood disorder such as depression and/or anxiety, lack of energy, lack of motivation, physical discomfort, as well as recurrent headaches, especially morning or nocturnal headaches. We talked about the importance of maintaining a healthy lifestyle and  striving for healthy weight. In addition, we talked about the importance of striving for and maintaining good sleep hygiene. I recommended a sleep study at this time. I outlined the differences between a laboratory attended sleep study which is considered more comprehensive and accurate over the option of a home sleep test (HST); the latter may lead to underestimation of sleep disordered breathing in some instances and does not help with diagnosing upper airway resistance syndrome and is not accurate enough to diagnose primary central sleep apnea typically. I outlined possible surgical and non-surgical treatment options of OSA, including the use of a positive airway pressure (PAP) device (i.e. CPAP, AutoPAP/APAP or  BiPAP in certain circumstances), a custom-made dental device (aka oral appliance, which would require a referral to a specialist dentist or orthodontist typically, and is generally speaking not considered for patients with full dentures or edentulous state), upper airway surgical options, such as traditional UPPP (which is not considered a first-line treatment) or the Inspire device (hypoglossal nerve stimulator, which would involve a referral for consultation with an ENT surgeon, after careful selection, following inclusion criteria - also not first-line treatment). I explained the PAP treatment option to the patient in detail, as this is generally considered first-line treatment.  The patient indicated that she would be willing to try PAP therapy, if the need arises. I explained the importance of being compliant with PAP treatment, not only for insurance purposes but primarily to improve patient's symptoms symptoms, and for the patient's long term health benefit, including to reduce Her cardiovascular risks longer-term.    We will pick up our discussion about the next steps and treatment options after testing.  We will keep them posted as to the test results by phone call and/or MyChart messaging  where possible.  We will plan to follow-up in sleep clinic accordingly as well.  I answered all their questions today and the patient and her husband were in agreement.   I encouraged them to call with any interim questions, concerns, problems or updates or email [place] through MyChart.  Generally speaking, sleep test authorizations may take up to 2 weeks, sometimes less, sometimes longer, the patient is encouraged to get in touch with [place] if they do not hear back from the sleep lab staff directly within the next 2 weeks.  Thank you very much for allowing me to participate in the care of this nice patient. If I can be of any further assistance to you please do not hesitate to talk to me.  Sincerely,   Huston Foley, MD, PhD

## 2022-08-08 ENCOUNTER — Telehealth: Payer: Self-pay | Admitting: Neurology

## 2022-08-08 NOTE — Telephone Encounter (Signed)
7/25:LVM-MLA 07/23/22 self pay? EE

## 2022-08-20 ENCOUNTER — Encounter: Payer: Medicaid Other | Attending: Psychology | Admitting: Psychology

## 2022-08-20 ENCOUNTER — Encounter: Payer: Self-pay | Admitting: Psychology

## 2022-08-20 DIAGNOSIS — F5101 Primary insomnia: Secondary | ICD-10-CM | POA: Insufficient documentation

## 2022-08-20 DIAGNOSIS — F39 Unspecified mood [affective] disorder: Secondary | ICD-10-CM | POA: Insufficient documentation

## 2022-08-20 DIAGNOSIS — R413 Other amnesia: Secondary | ICD-10-CM | POA: Insufficient documentation

## 2022-08-20 NOTE — Progress Notes (Signed)
Neuropsychological Consultation   Patient:   [facility]   DOB:   [birth year]  MR Number:  [id number]  Location:  [facility]ICINE & REHABILITATION [address] [place], [place] [address] [place] [address] Dept: [phone]           Date of Service:   08/19/2021  Location of Service and Individuals present: Today's visit was conducted in my outpatient clinic office with the patient, her daughter and myself present.  Start Time:   9 AM End Time:   11 AM  1 hour and 15 minutes was spent in face-to-face clinical interview and the other 45 minutes was spent with records review, report writing and setting up testing protocols.  Patient Consent and Confidentiality: Limits of confidentiality were reviewed particular related to the referral for neuropsychological evaluation which was explained and described with report being provided to her referring neurologist and being made available in the patient's electronic medical records for appropriate medical professionals to have access to.  Patient and daughter both agreed to proceed with this evaluation.  Consent for Evaluation and Treatment:  Signed:  Yes Explanation of Privacy Policies:  Signed:  Yes Discussion of Confidentiality Limits:  Yes  Provider/Observer:  Arley Phenix, Psy.D.       Clinical Neuropsychologist       Billing Code/Service: 96116/96121  Chief Complaint:     Chief Complaint  Patient presents with   Memory Loss   Anxiety   Depression    Reason for Service:    Adriana Ramsey [facility] is a 57 year old female referred for neuropsychological evaluation by her treating neurologist Ocie Doyne, MD due to descriptions of ongoing and somewhat worsening memory loss, forgetfulness, changes in geographic orientation and getting lost, episodic confusion and difficulties in completing work duties which resulted in her stopping working.  Patient and family  report that these difficulties and symptoms began to develop in 2021 first noticed by increased forgetfulness.  Family has noted some decline over time but reports today that there has not been a lot of changes in the past 6 months.  Patient has had previous MoCA testing with most recent MoCA testing being 20/30.  The patient family have noted some changes and worsening and emotional status with the patient having times of emotional distress, crying spells anxiety/depression.  Patient's husband had taken a leave of absence from work to help with the patient.  Patient was started on Aricept and Namenda and report that this does appear to have been somewhat helpful in her status.  Patient has also been started on Celexa which has also been helpful.  Patient is now following up with previous recommendations of formal sleep study to assess for possible obstructive sleep apnea with the patient displaying symptoms consistent with that possibility.  Patient has been referred for previous neuropsychological evaluation but it had not been set up.  The patient's past medical history includes a history of hypertension, fatty liver, GERD and peptic ulcer disease.  The patient has been diagnosed with mood disorder including depression and anxiety.  A full list of past medical history and active problem list available below.  During today's clinical interview, both the patient and her daughter were present.  They both report that symptoms for started being noted around 2021.  The patient reports that she for started forgetting where she is put things and had trouble finding them.  The patient reports that cueing would help sometimes  and then when she would find what she had misplaced that she would usually remember putting it there with this cueing.  Patient and daughter both deny any changes in expressive language or word finding difficulties.  The patient had followed recommendations and has significantly reduced driving  activities and only driving short distance to very familiar places.  She has had no accidents or difficulties with this.  Patient and daughter both report times when the patient was doing more driving that she had become turned around and disoriented when she was driving to familiar place.  They gave an example of a time where the patient was driving to exchange the grandkids and going to a place that the family had used to make this exchange many times over the years and became disoriented and confused.  The patient is no longer picking up the grandkids.  The patient acknowledges that she had started getting lost in familiar places and asking questions repeatedly and not remembering that she had done so.  Patient and daughter report that there have not been any significant changes over the past 6 to 9 months and things have not particularly gotten worse.  Medications have been added during this time as well.  The daughter reports that while the patient has not made any progressive changes over this time that there continue to be some bad days particularly with significant emotional distress.  The patient has times where her anxiety and depression will worsen that include crying spells and other acute emotional distress.  This is improved somewhat more recently.  The patient and daughter both report that there is a family history of cognitive/memory change and loss in family members.  The patient's aunt developed increasing memory difficulties that started in her 19s.  The patient's grandmother is also noted to have had progressive memory changes that started somewhere between 71 and 30 years old but this grandmother lived into her 62s.  The patient denies any tremors, changes in gait or motor functions, denies any visual or auditory hallucinations.  Significant sleep difficulties have persisted for some time.  There has been concerns about possible obstructive sleep apnea for some time as well.  The patient  notes that she has difficulty sleeping or falling asleep and will stay up until 1 or 2 AM each night.  Sometimes she sleeps until 10 to 10:30 in the morning but other times she is up by 730.  The patient denies any naps during the day.  Family members note significant snoring.  The patient has been scheduled or referred for a formal sleep study to be conducted but this has not been set up yet.  I encouraged the patient and her family to follow-up with this to make sure that it happens as this is a important piece of information and if obstructive sleep apnea is present it will be important to address.  The patient reports that her diet and range of foods have changed and she is not eating like she used to.  I encouraged a good dietary pattern as well during today's visit including lots of vegetables, whole grains nuts and seeds and other complex foods and limiting processed foods and sugar/refined carbohydrates.   Medical History:   Past Medical History:  Diagnosis Date   Essential hypertension    Fatty liver    Gallbladder polyp 2012   Last ultrasound 2015, no evidence of gallbladder polyp   GERD (gastroesophageal reflux disease)    Memory loss    PUD (  peptic ulcer disease) 2011   Seasonal allergies          Patient Active Problem List   Diagnosis Date Noted   Memory loss 06/05/2022   Mood disorder (HCC) 06/05/2022   Schatzki's ring of distal esophagus    Chest heaviness 11/23/2018   Degenerative TFCC tear, right, differential diagnosis 04/29/2018   Lung nodules 11/19/2016   Encounter for screening colonoscopy 02/29/2016   Vitamin D deficiency 09/20/2015   IBS (irritable bowel syndrome) 06/21/2014   Hypertriglyceridemia 03/23/2014   GERD (gastroesophageal reflux disease) 03/23/2014   De Quervain's syndrome (tenosynovitis) 11/02/2013   Fatty liver 11/29/2011   Gallbladder polyp 11/29/2011   Dysphagia 11/29/2011   Elevated LFTs 10/11/2011   Essential hypertension, benign  07/17/2011   Obesity (BMI 30-39.9) 07/17/2011   Hot flashes 07/17/2011   Anemia 07/17/2011    Onset and Duration of Symptoms: Symptoms are reported to have been developing around 2021 and the patient has stopped working because of her memory issues.  Progression of Symptoms: The patient and family have described progressing in her cognitive difficulties and memory loss but reports that over the past 6 months or so that there has not been appreciable change.  Additional Tests and Measures from other records:  Neuroimaging Results: The patient had an MRI of her brain conducted on 12/22/2021 interpreted by Delia Heady, MD.  The impressions from this MRI were of an unremarkable MRI scan of the brain with and without contrast showing only minor changes of chronic small vessel disease.  There were mild periventricular and subcortical nonspecific T2/FLAIR white matter hyperintensities which may have been seen due to a variety of potential conditions including small vessel disease, migraine, remote trauma, demyelinating process etc.  There was no indication of acute process.  These findings were indicative of only minor changes of chronic small vessel disease type issues.  Laboratory Tests: The patient has had blood work that have been relatively unremarkable although she has had some readings with low potassium and some mildly elevated blood glucose readings in the past but they have been highest of 121 with most recent at 93.  Vitamin B12 was measured at 2023 and it was within normal limits.  Vitamin D has also been followed through the years and it has been low at times over the past 8 years.  Other Relevant Assessments: The patient did have some type of psychological/neuropsychological evaluation as part of her Social Security disability workup but we do not have access to those findings.  Sleep: The patient has had difficulties with onset of sleep and usually does not go to sleep until 1 to 2 AM in  the morning and can get up either around 730 or sleep until 10-10 30.  There have been concerns around obstructive sleep apnea and the patient is being set up for formal sleep study.  Behavioral Observation/Mental Status:   Zimal Biedron Demetro  presents as a 57 y.o.-year-old Right handed African American Female who appeared her stated age. her dress was Appropriate and she was Well Groomed and her manners were Appropriate to the situation.  her participation was indicative of Appropriate and Attentive behaviors.  There were not physical disabilities noted.  she displayed an appropriate level of cooperation and motivation.    Interactions:    Active Appropriate  Attention:   within normal limits and attention span and concentration were age appropriate  Memory:   abnormal; remote memory intact, recent memory impaired  Visuo-spatial:   not examined  Speech (Volume):  normal  Speech:   normal; normal  Thought Process:  Coherent and Relevant  Coherent, Directed, Logical, and Organized  Though Content:  WNL; not suicidal and not homicidal  Orientation:   person, place, time/date, and situation  Judgment:   Good  Planning:   Fair  Affect:    Appropriate  Mood:    Anxious  Insight:   Good  Intelligence:   normal  Marital Status/Living:  The lives with her husband of 33 years and her adult son.  She has 2 adult children.  No significant medical illnesses were noted with her children.  Educational and Occupational History:     Highest Level of Education:   Patient graduated from high school and also graduated from [employer].  Current Occupation:    Patient is not working and stopped working due to [employer] difficulties and inability to complete job task.  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   Psychiatric History: Patient does have a  significant history of anxiety and depression with sudden onset crying spells and emotional distress that are also associated with episodes of acute confusion. Current psychotropic medications include citalopram/Celexa which she is taken without difficulties.  Patient is also taking Aricept and Namenda both prescribed by her neurology practice.  Patient denies any side effects and reports that the Celexa has apparently helped with some of her mood status.  History of Substance Use or Abuse:  No concerns of substance abuse are reported.    Family Med/Psych History:  Family History  Problem Relation Age of Onset   Hyperlipidemia Mother    Seizures Mother    Hypertension Father    Heart attack Maternal Grandfather        67   Colon cancer Neg Hx    Liver disease Neg Hx      Impression/DX:   Adriana Ramsey is a 57 year old female referred for neuropsychological evaluation by her treating neurologist Ocie Doyne, MD due to descriptions of ongoing and somewhat worsening memory loss, forgetfulness, changes in geographic orientation and getting lost, episodic confusion and difficulties in completing work duties which resulted in her stopping working.  Patient and family report that these difficulties and symptoms began to develop in 2021 first noticed by increased forgetfulness.  Family has noted some decline over time but reports today that there has not been a lot of changes in the past 6 months.  Patient has had previous MoCA testing with most recent MoCA testing being 20/30.  The patient family have noted some changes and worsening and emotional status with the patient having times of emotional distress, crying spells anxiety/depression.  Patient's husband had taken a leave of absence from work to help with the patient.  Patient was started on Aricept and Namenda and report that this does appear to have been somewhat helpful in her status.  Patient has also been started on Celexa which has  also been helpful.  Patient is now following up with previous recommendations of formal sleep study to assess for possible obstructive sleep apnea with the patient displaying symptoms consistent with that possibility.  Patient has been referred for previous neuropsychological evaluation but it had not been set up.  The patient's past medical history includes a history of hypertension, fatty liver, GERD and peptic ulcer disease.  The patient has been diagnosed with mood  disorder including depression and anxiety.  A full list of past medical history and active problem list available below.  Disposition/Plan:  We have set the patient up for formal neuropsychological evaluation and once his is completed and neuropsychological report will be completed and provided to her referring neurologist as well as made available in the patient's EMR.  I will also schedule a time to sit down with the patient and any family members she wants to include to go over the results and provide specific recommendations for her and her family that may be warranted or generated out of this evaluation.  Diagnosis:    Memory loss  Mood disorder (HCC)  Primary insomnia        Note: This document was prepared using Dragon voice recognition software and may include unintentional dictation errors.   Electronically Signed   _______________________ Arley Phenix, Psy.D. Clinical Neuropsychologist

## 2022-09-03 ENCOUNTER — Encounter (HOSPITAL_BASED_OUTPATIENT_CLINIC_OR_DEPARTMENT_OTHER): Payer: Self-pay

## 2022-09-03 DIAGNOSIS — F5101 Primary insomnia: Secondary | ICD-10-CM

## 2022-09-03 DIAGNOSIS — F39 Unspecified mood [affective] disorder: Secondary | ICD-10-CM

## 2022-09-03 DIAGNOSIS — R413 Other amnesia: Secondary | ICD-10-CM

## 2022-09-03 NOTE — Progress Notes (Signed)
Behavioral Observations:  The patient appeared well-groomed and appropriately dressed. Her manners were polite and appropriate to the situation. The patient's attitude towards testing was positive and her effort was good. The patient became frustrated during the WMS-IV and therefore did not complete the Verbal Paired Associates subtest or Designs II recall. The patient also reported feeling anxious during testing and struggled to remember instructions at times.  Neuropsychology Note  Adriana Ramsey completed 135 minutes of neuropsychological testing with technician, Staci Acosta, BA, under the supervision of Arley Phenix, PsyD., Clinical Neuropsychologist. The patient did not appear overtly distressed by the testing session, per behavioral observation or via self-report to the technician. Rest breaks were offered.   Clinical Decision Making: In considering the patient's current level of functioning, level of presumed impairment, nature of symptoms, emotional and behavioral responses during clinical interview, level of literacy, and observed level of motivation/effort, a battery of tests was selected by Dr. Kieth Brightly during initial consultation on 08/20/2022. This was communicated to the technician. Communication between the neuropsychologist and technician was ongoing throughout the testing session and changes were made as deemed necessary based on patient performance on testing, technician observations and additional pertinent factors such as those listed above.  Tests Administered: Controlled Oral Word Association Test (COWAT; FAS & Animals)  Grooved Pegboard Wechsler Adult Intelligence Scale, 4th Edition (WAIS-IV) Wechsler Memory Scale, 4th Edition (WMS-IV); Adult Battery  Results:  COWAT:  FAS total= 41 Z= -0.10 Animals total= 12 Z= -1.65  Grooved Pegboard:  R (DH) time= 104s  Percentile Rank= 26th L (NDH) time= 133s Percentile Rank=25th  WAIS-IV:  Composite Score  Summary  Scale Sum of Scaled Scores Composite Score Percentile Rank 95% Conf. Interval Qualitative Description  Verbal Comprehension 17 VCI 76 5 71-83 Borderline  Perceptual Reasoning 14 PRI 69 2 64-77 Extremely Low  Working Memory 12 WMI 77 6 72-85 Borderline  Processing Speed 13 PSI 81 10 75-91 Low Average  Full Scale 56 FSIQ 71 3 68-76 Borderline  General Ability 31 GAI 70 2 66-76 Borderline   Verbal Comprehension Subtests Summary  Subtest Raw Score Scaled Score Percentile Rank Reference Group Scaled Score SEM  Similarities 22 8 25 9  1.08  Vocabulary 19 5 5 6  0.73  Information 4 4 2 5  0.67  The scaled scores in the Reference Group Scaled Score column are based on the performance of examinees aged 20:0-34:11 (i.e., the reference group). See Chapter 6 of the WAIS-IV Technical and Interpretive Manual for more information.  Perceptual Reasoning Subtests Summary  Subtest Raw Score Scaled Score Percentile Rank Reference Group Scaled Score SEM  Block Design 6 2 0.4 2 1.04  Matrix Reasoning 10 7 16 5  0.95  Visual Puzzles 6 5 5 4  0.99   Working Librarian, academic Raw Score Scaled Score Percentile Rank Reference Group Scaled Score SEM  Digit Span 23 8 25 7  0.85  Arithmetic 7 4 2 5  1.04   Processing Speed Subtests Summary  Subtest Raw Score Scaled Score Percentile Rank Reference Group Scaled Score SEM  Symbol Search 9 3 1 2  1.31  Coding 58 10 50 7 0.99    WMS-IV:  Index Score Summary  Index Sum of Scaled Scores Index Score Percentile Rank 95% Confidence Interval Qualitative Descriptor  Auditory Memory (AMI) 4 40 <0.1 37-49 Extremely Low  Visual Memory (VMI) 9 50 <0.1 46-58 Extremely Low  Visual Working Memory (VWMI) 11 73 4 68-82 Borderline  Immediate Memory (IMI) 9 47 <0.1 43-56  Extremely Low  Delayed Memory (DMI) 4 40 <0.1 37-50 Extremely Low   Primary Subtest Scaled Score Summary  Subtest Domain Raw Score Scaled Score Percentile Rank  Logical Memory I AM 7 1  0.1  Logical Memory II AM 0 1 0.1  Verbal Paired Associates I AM 0 1 0.1  Verbal Paired Associates II AM 0 1 0.1  Designs I VM 40 4 2  Designs II VM 0 1 0.1  Visual Reproduction I VM 19 3 1   Visual Reproduction II VM 0 1 0.1    Auditory Memory Process Score Summary  Process Score Raw Score Scaled Score Percentile Rank Cumulative Percentage (Base Rate)  LM II Recognition 14 - - <=2%  VPA II Recognition 0 - - <=2%   Visual Memory Process Score Summary  Process Score Raw Score Scaled Score Percentile Rank Cumulative Percentage (Base Rate)  DE I Content 25 5 5  -  DE I Spatial 11 6 9  -  DE II Content 0 1 0.1 -  DE II Spatial 0 1 0.1 -  DE II Recognition 9 - - 3-9%  VR II Recognition 1 - - <=2%   ABILITY-MEMORY ANALYSIS  Ability Score:  VCI: 76 Date of Testing:  WAIS-IV; WMS-IV 2022/09/03  Predicted Difference Method   Index Predicted WMS-IV Index Score Actual WMS-IV Index Score Difference Critical Value  Significant Difference Y/N Base Rate  Auditory Memory 88 40 48 9.00 Y <1%  Visual Memory 89 50 39 8.38 Y <1%  Visual Working Memory 87 73 14 10.86 Y 10-15%  Immediate Memory 86 47 39 10.12 Y <1%  Delayed Memory 88 40 48 9.95 Y <1%  Statistical significance (critical value) at the .01 level.   Feedback to Patient: Adriana Ramsey will return on 06/04/2023 for an interactive feedback session with Dr. Kieth Brightly at which time her test performances, clinical impressions and treatment recommendations will be reviewed in detail. The patient understands she can contact our office should she require our assistance before this time.  135 minutes spent face-to-face with patient administering standardized tests, 30 minutes spent scoring Radiographer, therapeutic). [CPT [id number], [id number]]  Full report to follow.

## 2022-11-04 ENCOUNTER — Ambulatory Visit (INDEPENDENT_AMBULATORY_CARE_PROVIDER_SITE_OTHER): Payer: Medicaid Other | Admitting: Neurology

## 2022-11-04 DIAGNOSIS — R0683 Snoring: Secondary | ICD-10-CM | POA: Diagnosis not present

## 2022-11-04 DIAGNOSIS — G4719 Other hypersomnia: Secondary | ICD-10-CM

## 2022-11-04 DIAGNOSIS — Z9189 Other specified personal risk factors, not elsewhere classified: Secondary | ICD-10-CM

## 2022-11-04 DIAGNOSIS — R351 Nocturia: Secondary | ICD-10-CM

## 2022-11-04 DIAGNOSIS — E663 Overweight: Secondary | ICD-10-CM

## 2022-11-04 DIAGNOSIS — R0681 Apnea, not elsewhere classified: Secondary | ICD-10-CM

## 2022-11-04 DIAGNOSIS — G479 Sleep disorder, unspecified: Secondary | ICD-10-CM

## 2022-11-04 DIAGNOSIS — R413 Other amnesia: Secondary | ICD-10-CM

## 2022-11-05 NOTE — Addendum Note (Signed)
Addended by: Huston Foley on: 11/05/2022 07:53 PM   Modules accepted: Orders

## 2022-11-05 NOTE — Procedures (Signed)
Physician Interpretation:     [facility] POLYSOMNOGRAPHY  INTERPRETATION REPORT   STUDY DATE:  11/04/2022     PATIENT NAME:  Adriana Ramsey         DATE OF BIRTH:  1965/02/26  PATIENT ID:  [id number]    TYPE OF STUDY:  PSG  READING PHYSICIAN: Huston Foley, MD, PhD   SCORING TECHNICIAN: Domingo Cocking, RPSGT     Referred by: Margie Ege, NP ? History and Indication for Testing: 57 year old female with an underlying medical history of reflux disease, peptic ulcer disease, seasonal allergies, fatty liver, hypertension, memory loss, and overweight state, who reports snoring and some excessive daytime somnolence. Her Epworth sleepiness score is 8 out of 24, fatigue severity score is 11 out of 63.  Height: 60 in Weight: 145 lb (BMI 28) Neck Size: 15 in    MEDICATIONS: Norvasc, Lipitor, Celexa, Aricept, Hydrodiuril, Namenda   TECHNICAL DESCRIPTION: A registered sleep technologist  was in attendance for the duration of the recording.  Data collection, scoring, video monitoring, and reporting were performed in compliance with the AASM Manual for the Scoring of Sleep and Associated Events; (Hypopnea is scored based on the criteria listed in Section VIII D. 1b in the AASM Manual V2.6 using a 4% oxygen desaturation rule or Hypopnea is scored based on the criteria listed in Section VIII D. 1a in the AASM Manual V2.6 using 3% oxygen desaturation and /or arousal rule).   SLEEP CONTINUITY AND SLEEP ARCHITECTURE:  Lights-out was at 20:59: and lights-on at  03:06:, with a total recording time of 6 hours, 6.5 min. Total sleep time ( TST) was 0.0 minutes.   BODY POSITION:  TST was divided  between the following sleep positions: 0.0% supine;  0.0% lateral;  0% prone.  RESPIRATORY MONITORING:   Based on CMS criteria (using a 4% oxygen desaturation rule for scoring hypopneas), there were 0 apneas (0 obstructive; 0 central; 0 mixed), and 0 hypopneas.  Apnea index was 0.0.  Hypopnea index was 0.0. The apnea-hypopnea index was N/A.    OXIMETRY: Oxyhemoglobin Saturation was on average 95%.    LIMB MOVEMENTS: There were 0 periodic limb movements of sleep (N/A), of which 0 (0.0/hr) were associated with an arousal.   AROUSAL: There were N/A arousals in total. EEG: Review of the EEG showed no abnormal electrical discharges and symmetrical bihemispheric findings.    EKG: The EKG revealed normal sinus rhythm (NSR).   AUDIO/VIDEO REVIEW: The audio and video review showed that patient was tearful at times, she appeared confused at times (per acquisition technologist), she sat up in bed for the majority of the study.  The patient took 3 restroom breaks.  A POST-STUDY QUESTIONNAIRE was not completed.  IMPRESSION:  1. Inconclusive Test, sleep difficulty  RECOMMENDATIONS:   1. This sleep study is inconclusive as the patient did not achieve any sleep.  She will be offered a home sleep test.    I certify that I have reviewed the entire raw data recording prior to the issuance of this report in accordance with the Standards of Accreditation of the American Academy of Sleep Medicine (AASM).  Huston Foley, MD, PhD Medical Director, [facility]) Diplomat, ABPN (Neurology and Sleep)              Technical Report:   General Information  Name: Adriana Ramsey BMI: 28.32 Physician: Huston Foley, MD  ID: [id number] Height: 60.0 in Technician: Domingo Cocking, RPSGT  Sex: Female Weight: 145.0 lb Record: [id number]  Age: 57 [May 16, 1965] Date: 11/04/2022    Medical & Medication History    [name] is a 57 year old female with an underlying medical history of reflux disease, peptic ulcer disease, seasonal allergies, fatty liver, hypertension, memory loss, and overweight state, who reports snoring and some excessive daytime somnolence. Her Epworth sleepiness score is 8 out of 24, fatigue severity score is 11 out of 63.  She usually takes a nap daily, up to 1 hour, usually in the late morning. She lives with her husband and 76 year old son. 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 She is a non-smoker and drinks alcohol rarely, caffeine in the form of soda, 1 or 2 cans/day on average. She has nocturia about once per average night, denies recurrent nocturnal or morning headaches. Bedtime is generally between 9 and 9:30 PM and rise time around 6 AM. Her husband wakes her up when he goes to work. She may go back to sleep in the late morning for up to an hour. They have no pets in the household. She does have a TV in her bedroom and it tends to stay on all night, this is primarily her husband's preference or habit. He has noticed breathing pauses and choking sounds while she is asleep. Her snoring is variable. She had a tonsillectomy as a child.  Norvasc, Lipitor, Celexa, Aricept, Hydrodiuril, Namenda   Sleep Disorder      Comments   Patient arrived for a diagnostic polysomnogram. Procedure explained and all questions answered. Standard paste setup without complications. Patient sat up for majority of this study. Patient appeared confused at times as to where she was, where her husband was, and she had some crying spells. Patient attempted slept supine, right, and left. No quality sleep was noted. No snoring heard. No significant cardiac arrhythmias observed. No significant PLMS observed. Three restroom visits.    Lights out: 08:59:58 PM Lights on: 03:06:20 AM   Time Total Supine Side Prone Upright  Recording (TRT) 6h 6.25m 2h 32.22m 3h 34.29m 0h 0.110m 0h 0.65m  Sleep (TST) 0h 0.107m 0h 0.60m 0h 0.23m 0h 0.90m 0h 0.96m   Latency N1 N2 N3 REM Onset Per. Slp. Eff.  Actual 0h 0.60m 0h 0.28m 0h 0.64m 0h 0.99m 0h 0.47m 0h 0.49m 0.00%   Stg Dur Wake N1 N2 N3 REM  Total 366.5 0.0 0.0 0.0 0.0  Supine 152.0 0.0 0.0 0.0 0.0  Side 214.5 0.0 0.0 0.0 0.0  Prone 0.0 0.0 0.0 0.0 0.0  Upright 0.0 0.0 0.0 0.0  0.0   Stg % Wake N1 N2 N3 REM  Total 100.0 0.0 0.0 0.0 0.0  Supine 41.5 0.0 0.0 0.0 0.0  Side 58.5 0.0 0.0 0.0 0.0  Prone 0.0 0.0 0.0 0.0 0.0  Upright 0.0 0.0 0.0 0.0 0.0     Apnea Summary Sub Supine Side Prone Upright  Total 0 Total 0 0 0 0 0    REM 0 0 0 0 0    NREM 0 0 0 0 0  Obs 0 REM 0 0 0 0 0    NREM 0 0 0 0 0  Mix 0 REM 0 0 0 0 0    NREM 0 0 0 0 0  Cen 0 REM 0 0 0 0 0    NREM 0 0 0 0 0   Rera Summary Sub Supine Side Prone Upright  Total 0 Total 0  0 0 0 0    REM 0 0 0 0 0    NREM 0 0 0 0 0   Hypopnea Summary Sub Supine Side Prone Upright  Total 0 Total 0 0 0 0 0    REM 0 0 0 0 0    NREM 0 0 0 0 0   4% Hypopnea Summary Sub Supine Side Prone Upright  Total (4%) 0 Total 0 0 0 0 0    REM 0 0 0 0 0    NREM 0 0 0 0 0     AHI Total Obs Mix Cen  0.00 Apnea 0.00 0.00 0.00 0.00   Hypopnea 0.00 -- -- --  0.00 Hypopnea (4%) 0.00 -- -- --    Total Supine Side Prone Upright  Position AHI 0.00 0.00 0.00 0.00 0.00  REM AHI 0.00   NREM AHI 0.00   Position RDI 0.00 0.00 0.00 0.00 0.00  REM RDI 0.00   NREM RDI 0.00    4% Hypopnea Total Supine Side Prone Upright  Position AHI (4%) 0.00 0.00 0.00 0.00 0.00  REM AHI (4%) 0.00   NREM AHI (4%) 0.00   Position RDI (4%) 0.00 0.00 0.00 0.00 0.00  REM RDI (4%) 0.00   NREM RDI (4%) 0.00    Desaturation Information Threshold: 2% <100% <90% <80% <70% <60% <50% <40%  Supine 135.0 13.0 1.0 0.0 0.0 0.0 0.0  Side 201.0 14.0 0.0 0.0 0.0 0.0 0.0  Prone 0.0 0.0 0.0 0.0 0.0 0.0 0.0  Upright 0.0 0.0 0.0 0.0 0.0 0.0 0.0  Total 336.0 27.0 1.0 0.0 0.0 0.0 0.0  Index 0.0 0.0 0.0 0.0 0.0 0.0 0.0   Threshold: 3% <100% <90% <80% <70% <60% <50% <40%  Supine 116.0 13.0 1.0 0.0 0.0 0.0 0.0  Side 167.0 14.0 0.0 0.0 0.0 0.0 0.0  Prone 0.0 0.0 0.0 0.0 0.0 0.0 0.0  Upright 0.0 0.0 0.0 0.0 0.0 0.0 0.0  Total 283.0 27.0 1.0 0.0 0.0 0.0 0.0  Index 0.0 0.0 0.0 0.0 0.0 0.0 0.0   Threshold: 4% <100% <90% <80% <70% <60% <50% <40%  Supine 94.0 13.0  1.0 0.0 0.0 0.0 0.0  Side 129.0 14.0 0.0 0.0 0.0 0.0 0.0  Prone 0.0 0.0 0.0 0.0 0.0 0.0 0.0  Upright 0.0 0.0 0.0 0.0 0.0 0.0 0.0  Total 223.0 27.0 1.0 0.0 0.0 0.0 0.0  Index 0.0 0.0 0.0 0.0 0.0 0.0 0.0   Threshold: 3% <100% <90% <80% <70% <60% <50% <40%  Supine 116 13 1 0 0 0 0  Side 167 14 0 0 0 0 0  Prone 0 0 0 0 0 0 0  Upright 0 0 0 0 0 0 0  Total 283 27 1 0 0 0 0   Awakening/Arousal Information # of Awakenings 1  Wake after sleep onset 366.86m  Wake after persistent sleep 0.105m   Arousal Assoc. Arousals Index  Apneas 0 0.0  Hypopneas 0 0.0  Leg Movements 0 0.0  Snore 0 0.0  PTT Arousals 0 0.0  Spontaneous 2 0.0  Total 2 0.0  Leg Movement Information PLMS LMs Index  Total LMs during PLMS 0 0.0  LMs w/ Microarousals 0 0.0   LM LMs Index  w/ Microarousal 0 0.0  w/ Awakening 0 0.0  w/ Resp Event 0 0.0  Spontaneous 0 0.0  Total 0 0.0     Desaturation threshold setting: 3% Minimum desaturation setting: 10 seconds SaO2 nadir: 53% The longest event  was a 0 sec N/A Apnea with a minimum SaO2 of --%. The lowest SaO2 was --% associated with a 00 sec N/A Apnea. EKG Rates EKG Avg Max Min  Awake 62 99 35  Asleep 0 0 0  EKG Events: N/A

## 2022-11-06 ENCOUNTER — Telehealth: Payer: Self-pay | Admitting: *Deleted

## 2022-11-06 NOTE — Telephone Encounter (Signed)
-----   Message from Adriana Ramsey sent at 11/05/2022  7:53 PM EDT ----- Patient had an attempted sleep study last night. She did not achieve any sleep. She was referred by Donetta Potts. Please discuss with patient or family member on DPR if she would like to go ahead with a home sleep test? I would be happy to order an HST. Copying Margie Ege. Pls let me know.

## 2022-11-06 NOTE — Telephone Encounter (Signed)
Called pt & LVM with office number asking for call back.  

## 2022-11-07 NOTE — Telephone Encounter (Signed)
Called pt & husband and LVM on both phones asking for call back.

## 2022-11-09 ENCOUNTER — Other Ambulatory Visit: Payer: Self-pay | Admitting: Neurology

## 2022-11-11 NOTE — Telephone Encounter (Addendum)
Spoke to daughter (checked DPR) Daughter ok for pt to continue with HST . Daughter is requesting that you call her with any results for pt . Per Daughter pt husband is out of state Daughter is on [place]

## 2022-11-11 NOTE — Telephone Encounter (Signed)
HST order already on file. Please proceed w auth.

## 2022-11-28 ENCOUNTER — Other Ambulatory Visit: Payer: Self-pay | Admitting: Neurology

## 2022-12-12 ENCOUNTER — Ambulatory Visit: Payer: Medicaid - Out of State | Admitting: Neurology

## 2022-12-17 ENCOUNTER — Ambulatory Visit: Payer: Medicaid Other | Admitting: Neurology

## 2022-12-17 DIAGNOSIS — G479 Sleep disorder, unspecified: Secondary | ICD-10-CM

## 2022-12-17 DIAGNOSIS — G4733 Obstructive sleep apnea (adult) (pediatric): Secondary | ICD-10-CM | POA: Diagnosis not present

## 2022-12-17 DIAGNOSIS — R0681 Apnea, not elsewhere classified: Secondary | ICD-10-CM

## 2022-12-17 DIAGNOSIS — G4719 Other hypersomnia: Secondary | ICD-10-CM

## 2022-12-17 DIAGNOSIS — R351 Nocturia: Secondary | ICD-10-CM

## 2022-12-17 DIAGNOSIS — Z9189 Other specified personal risk factors, not elsewhere classified: Secondary | ICD-10-CM

## 2022-12-17 DIAGNOSIS — E663 Overweight: Secondary | ICD-10-CM

## 2022-12-17 DIAGNOSIS — R413 Other amnesia: Secondary | ICD-10-CM

## 2022-12-17 DIAGNOSIS — R0683 Snoring: Secondary | ICD-10-CM

## 2022-12-18 NOTE — Progress Notes (Signed)
See procedure note.

## 2022-12-18 NOTE — Procedures (Signed)
[facility]  HOME SLEEP TEST (Watch PAT) REPORT  STUDY DATE: 12/17/2022  DOB: [birth year]  MRN: [id number]  ORDERING CLINICIAN: Huston Foley, MD, PhD   REFERRING CLINICIAN:  Margie Ege, NP  CLINICAL INFORMATION/HISTORY: 57 year old female with an underlying medical history of reflux disease, peptic ulcer disease, seasonal allergies, fatty liver, hypertension, memory loss, and overweight state, who reports snoring and some excessive daytime somnolence.  She had an attempted laboratory attended sleep study on 11/04/2022 but did not achieve any sleep.  She presents for a home sleep test.  Epworth sleepiness score: 8/24.  BMI: 28.5 kg/m  FINDINGS:   Sleep Summary:   Total Recording Time (hours, min): 3 hours, 35 min  Total Sleep Time (hours, min):  3 hours, 12 min  Percent REM (%):    28.6%   Respiratory Indices:   Calculated pAHI (per hour):  30.6/hour         REM pAHI:    58/hour       NREM pAHI: 19.7/hour  Central pAHI: 0.6/hour  Oxygen Saturation Statistics:    Oxygen Saturation (%) Mean: 92%   Minimum oxygen saturation (%):                 77%   O2 Saturation Range (%): 77 - 96%    O2 Saturation (minutes) <=88%: 4.5 min  Pulse Rate Statistics:   Pulse Mean (bpm):    64/min    Pulse Range (54 - 94/min)   IMPRESSION: OSA (obstructive sleep apnea), severe  RECOMMENDATION:  This home sleep test demonstrates severe obstructive sleep apnea with a total AHI of 30.6/hour and O2 nadir of 77% with variable snoring in the mild to moderate range, at times louder.  Limited sleep of slightly over 3 hours was achieved. Treatment with positive airway pressure is highly recommended. The patient will be advised to proceed with an autoPAP titration/trial at home. A laboratory attended titration study can be considered in the future for optimization of treatment settings and to improve tolerance and compliance, if needed, down the road. Alternative  treatment options are limited secondary to the severity of the patient's sleep disordered breathing, but may include surgical treatment with an implantable hypoglossal nerve stimulator (in carefully selected candidates, meeting criteria).  Concomitant weight loss is recommended (where clinically appropriate). Please note, that untreated obstructive sleep apnea may carry additional perioperative morbidity. Patients with significant obstructive sleep apnea should receive perioperative PAP therapy and the surgeons and particularly the anesthesiologist should be informed of the diagnosis and the severity of the sleep disordered breathing. The patient should be cautioned not to drive, work at heights, or operate dangerous or heavy equipment when tired or sleepy. Review and reiteration of good sleep hygiene measures should be pursued with any patient. Other causes of the patient's symptoms, including circadian rhythm disturbances, an underlying mood disorder, medication effect and/or an underlying medical problem cannot be ruled out based on this test. Clinical correlation is recommended.  The patient and her referring provider will be notified of the test results. The patient will be seen in follow up in sleep clinic at [facility].  I certify that I have reviewed the raw data recording prior to the issuance of this report in accordance with the standards of the American Academy of Sleep Medicine (AASM).  INTERPRETING PHYSICIAN:   Huston Foley, MD, PhD Medical Director, [facility]) Diplomat, ABPN (Neurology and Sleep)   [facility] [address] [place],  [place] [address] [phone]

## 2022-12-18 NOTE — Addendum Note (Signed)
Addended by: Huston Foley on: 12/18/2022 06:53 PM   Modules accepted: Orders

## 2022-12-23 ENCOUNTER — Telehealth: Payer: Self-pay | Admitting: *Deleted

## 2022-12-23 NOTE — Telephone Encounter (Signed)
-----   Message from Huston Foley sent at 12/18/2022  6:53 PM EST ----- Patient was referred by Margie Ege, NP.  I had seen her on 07/10/2022 and she had an attempted laboratory study on 11/04/2022 but did not achieve any sleep.  She had a home sleep test on 12/17/2022 which showed evidence of severe obstructive sleep apnea.  Please notify patient's husband that I would highly recommend she start home AutoPap therapy.  We will set her up with a DME company and they will teach her how to use the machine and fit her with a mask and they will provide the machine and supplies.  She can keep her follow-up appointment with Maralyn Sago for memory loss as scheduled but she will need a follow-up in sleep clinic in about 2 to 3 months after set up as well.

## 2022-12-23 NOTE — Telephone Encounter (Signed)
I called the patient's husband and left voicemail asking for call back. I then called the main line on chart which is the daughter however she is not on pt's [facility] DPR.  I explained to her that unfortunately I am unable to discuss pt's results without her being on the [facility]. She is going to try to get the patient's husband to call us back and then next time pt is here we would need a new DPR form completed giving [place] permission to speak with the daughter.

## 2022-12-24 NOTE — Telephone Encounter (Signed)
Pt's daughter and husband returning call. Requesting call back

## 2022-12-24 NOTE — Telephone Encounter (Signed)
I spoke with the patient's husband [name] (on [place]) and he had [place] listen in. I discussed the sleep study results and he agreed to setup urgently on autopap as recommended by Dr. Frances Furbish to treat the patient's severe sleep apnea.  They live near [place]. Will request [facility] location there for setup.  We discussed the insurance compliance requirements which includes using the machine at least 4 hours at night and also being seen by our office between 30 and 90 days after set up.  At this time they will keep the January 8 appointment for follow-up since it has been since May the patient was seen.  The new appointment for AutoPap compliance visit has been scheduled for 03/25/2023 at 12:45 PM arrival 1215.  His questions were answered and he verbalized appreciation for the call.  He is aware a new DPR would need to be completed by the patient next time she is here if they wish for daughter to be the person we speak with.  Referral sent to [facility].

## 2022-12-25 NOTE — Telephone Encounter (Signed)
[facility] confirmed receipt of order.  

## 2023-01-14 NOTE — Progress Notes (Signed)
 Patient: Adriana Ramsey  Date of Birth: 30-Aug-1965  Reason for Visit: Follow up History from: Patient, husband, Adriana Ramsey.  Primary Neurologist: Chima/Athar   ASSESSMENT AND PLAN 58 y.o. year old female   1.  Memory loss with mood/behavioral disturbance  -MoCA stable 20/30, continues with memory difficulty, feeling very emotional, crying -Saw Dr. Corina in August 2024, results for testing are pended, I will reach out to Dr. Corina to see if she can have sooner follow-up to review feedback -We will restart Celexa  10 mg daily, can go back up to 20 mg if needed -Continue Aricept  10 mg at bedtime, will restart Namenda  10 mg twice daily with slow titration -Have recommended against driving -I gave them information to contact [facility] about starting CPAP, HST showed severe sleep apnea -Have recommended brain stimulating activity, exercise, healthy eating, management of vascular risk factors -Follow-up with me in 6 months or sooner if needed  HISTORY OF PRESENT ILLNESS: Today 01/15/23 Saw Dr. Corina in August 2024 for neuropsych consultation.  Had testing a few days later, results will be shared May 2025?  Had HST 12/17/2022 that showed severe OSA AHI 30.6/hour and O2 nadir of 77%.  Has not started CPAP yet.. Today MOCA 20/30. On bad days, emotional, easily irritable, poor memory. Is tearful today. Today is emotional, crying about her memory. Her husband has taken permanent leave of absence. Feels a weight on her family. Does her own ADLs. She helps with household tasks. Doesn't drive often, drives to her moms house down the street. Her son lives with her. Has been out of Celexa  for several months, unclear about Aricept  and Namenda .   Update 06/05/22 SS: MRI of the brain showed minor age-related changes.  Never underwent neuropsychological evaluation or sleep consult.  Had sent MyChart message in April 2024 daughter reporting extreme anxiety, crying.  She was started on Celexa .  Apparently her daughter tried to set up her neuro psy testing and she didn't want to go. Didn't want to do sleep study, she snores. MOCA 20/30 today.  Has her moments with her memory, because she knows its not as good. Husband manages appointments/medications. She asks the same questions. Her husband has taken some time off due to patient's emotional issues, she was calling him all the time. She does her ADLs, she does laundry, cooking. She is driving, she gets lost. On Aricept  5 mg daily, Namenda  10 mg daily. We are not sure why the Aricept  got lowered from 10 to 5. 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 We called her daughter on Face time, Adriana Ramsey.   HISTORY  11/20/21 Dr. Rush Brief HPI: 58 year old female with a history of HTN who follows in clinic for memory loss.    At her last visit, brain MRI was ordered. She was referred to neuropsychology. Referral to sleep was placed to assess for OSA. She was started on donepezil  for memory loss.   Interval History: She has personally not noticed any difference in her memory, however her family states she has continued to decline. She has not noticed any improvement in her memory since starting donepezil . She repeats the same questions within minutes of each other. Will forget appointments. Has left the stove on multiple times. Anxiety seems worse as well, and she has been crying daily. She has been more irritable toward her family.  REVIEW OF SYSTEMS: Out of a complete 14 system review of symptoms, the patient complains  only of the following symptoms, and all other reviewed systems are negative.  See HPI  ALLERGIES: Allergies  Allergen Reactions   Elemental Sulfur Swelling   Ibuprofen Itching   Penicillins Hives and Swelling    HOME MEDICATIONS: Outpatient Medications Prior to Visit  Medication Sig Dispense Refill   amLODipine (NORVASC) 5 MG tablet Take 5 mg by mouth daily.     citalopram  (CELEXA ) 20  MG tablet TAKE 1 TABLET BY MOUTH EVERY DAY 90 tablet 3   donepezil  (ARICEPT ) 10 MG tablet TAKE 1 TABLET BY MOUTH EVERYDAY AT BEDTIME 90 tablet 1   hydrochlorothiazide  (HYDRODIURIL ) 25 MG tablet Take 1 tablet (25 mg total) daily by mouth. 90 tablet 2   memantine  (NAMENDA ) 10 MG tablet TAKE 1 TABLET BY MOUTH TWICE A DAY (Patient taking differently: Take 10 mg by mouth daily.) 180 tablet 3   atorvastatin (LIPITOR) 10 MG tablet Take 10 mg by mouth daily.     No facility-administered medications prior to visit.    PAST MEDICAL HISTORY: Past Medical History:  Diagnosis Date   Essential hypertension    Fatty liver    Gallbladder polyp 2012   Last ultrasound 2015, no evidence of gallbladder polyp   GERD (gastroesophageal reflux disease)    Memory loss    PUD (peptic ulcer disease) 2011   Seasonal allergies     PAST SURGICAL HISTORY: Past Surgical History:  Procedure Laterality Date   BREAST REDUCTION SURGERY  2007   CESAREAN SECTION     2   COLONOSCOPY N/A 03/14/2016   Procedure: COLONOSCOPY;  Surgeon: [name], MD;  mild diverticulosis in the sigmoid and descending colon, redundant rectosigmoid colon, external and internal hemorrhoids.  Due for repeat colonoscopy in 2028.    ESOPHAGOGASTRODUODENOSCOPY  11/23/2009   NUR: hypopharyngeal erosions of unclear sidnificance/mild changes of reflux /small sliding hiatal hernia and non Schatzki ring/3-mm gastric ulcer along the antrum with 2 scars and few erosions. h.pyori serologies negative   ESOPHAGOGASTRODUODENOSCOPY N/A 04/21/2019   Procedure: ESOPHAGOGASTRODUODENOSCOPY (EGD);  Surgeon: Haddock Margo LITTIE, MD;  Moderate Schatzki's ring s/p dilation, small hiatal hernia, otherwise normal exam.   PARTIAL HYSTERECTOMY     REDUCTION MAMMAPLASTY Bilateral    SAVORY DILATION N/A 04/21/2019   Procedure: SAVORY DILATION;  Surgeon: Haddock Margo LITTIE, MD;  [facility];  Service: Endoscopy;  Laterality: N/A;    FAMILY HISTORY: Family  History  Problem Relation Age of Onset   Hyperlipidemia Mother    Seizures Mother    Hypertension Father    Heart attack Maternal Grandfather        40   Colon cancer Neg Hx    Liver disease Neg Hx     SOCIAL HISTORY: Social History   Socioeconomic History   Marital status: Married    Spouse name: Not on file   Number of children: 2   Years of education: Not on file   Highest education level: Some college, no degree  Occupational History   Occupation: Research Scientist (medical): [employer]  Tobacco Use   Smoking status: Never   Smokeless tobacco: Never  Vaping Use   Vaping status: Never Used  Substance and Sexual Activity   Alcohol use: Yes    Comment: wine 1 glass per week   Drug use: No   Sexual activity: Yes  Other Topics Concern   Not on file  Social History Narrative   Lives w/ husband w/ one of their children.  Right-handed.   Caffeine use: one cup per day.    Social Drivers of Corporate Investment Banker Strain: Not on file  Food Insecurity: Not on file  Transportation Needs: Not on file  Physical Activity: Not on file  Stress: Not on file  Social Connections: Not on file  Intimate Partner Violence: Not on file   PHYSICAL EXAM  Vitals:   01/15/23 1000  BP: 123/81  Pulse: 67  Weight: 151 lb 6.4 oz (68.7 kg)  Height: 5' (1.524 m)    Body mass index is 29.57 kg/m.    01/15/2023   10:04 AM 06/05/2022    2:05 PM 08/27/2021   10:42 AM 06/15/2021   10:08 AM  Montreal Cognitive Assessment   Visuospatial/ Executive (0/5) 3 3 2 2   Naming (0/3) 3 3 3 3   Attention: Read list of digits (0/2) 2 1 2 2   Attention: Read list of letters (0/1) 1 1 1 1   Attention: Serial 7 subtraction starting at 100 (0/3) 1 3 3 3   Language: Repeat phrase (0/2) 2 2 2 2   Language : Fluency (0/1) 1 1 1 1   Abstraction (0/2) 1 1 2 1   Delayed Recall (0/5) 0 0 0 0  Orientation (0/6) 6 5 6 5   Total 20 20 22 20   Adjusted Score (based on education)  20  20    Generalized: Well developed, in no acute distress  Neurological examination  Mentation: Alert oriented to time, place, is a limited historian doesn't go into detail, husband provides most information.  Tearful today.  Follows all commands speech and language fluent Cranial nerve II-XII: Pupils were equal round reactive to light. Extraocular movements were full, visual field were full on confrontational test. Facial sensation and strength were normal. Head turning and shoulder shrug  were normal and symmetric. Motor: The motor testing reveals 5 over 5 strength of all 4 extremities. Good symmetric motor tone is noted throughout.  Sensory: Sensory testing is intact to soft touch on all 4 extremities. No evidence of extinction is noted.  Coordination: Cerebellar testing reveals good finger-nose-finger and heel-to-shin bilaterally.  Gait and station: Gait is normal.  Reflexes: Deep tendon reflexes are symmetric and normal bilaterally.   DIAGNOSTIC DATA (LABS, IMAGING, TESTING) - I reviewed patient records, labs, notes, testing and imaging myself where available.  Lab Results  Component Value Date   WBC 3.8 (L) 01/22/2022   HGB 14.6 01/22/2022   HCT 44.9 01/22/2022   MCV 84.9 01/22/2022   PLT 324 01/22/2022      Component Value Date/Time   NA 136 01/22/2022 1559   K 2.8 (L) 01/22/2022 1559   CL 97 (L) 01/22/2022 1559   CO2 30 01/22/2022 1559   GLUCOSE 93 01/22/2022 1559   BUN 11 01/22/2022 1559   CREATININE 0.71 01/22/2022 1559   CREATININE 0.85 03/25/2017 0913   CALCIUM 9.7 01/22/2022 1559   PROT 7.7 03/25/2017 0913   ALBUMIN 4.5 09/06/2016 0848   AST 30 03/25/2017 0913   ALT 32 (H) 03/25/2017 0913   ALKPHOS 59 09/06/2016 0848   BILITOT 0.7 03/25/2017 0913   GFRNONAA >60 01/22/2022 1559   GFRNONAA 78 09/06/2016 0848   GFRAA >89 09/06/2016 0848   Lab Results  Component Value Date   CHOL 189 03/25/2017   HDL 44 (L) 03/25/2017   LDLCALC 117 (H) 03/25/2017   TRIG 165 (H)  03/25/2017   CHOLHDL 4.3 03/25/2017   Lab Results  Component Value Date   HGBA1C 5.8 (  H) 02/07/2016   Lab Results  Component Value Date   VITAMINB12 472 06/15/2021   Lab Results  Component Value Date   TSH 1.92 02/07/2016    Lauraine Born, AGNP-C, DNP 01/15/2023, 10:32 AM [facility] [address], [address] [place], [place] [address] [phone]

## 2023-01-15 ENCOUNTER — Encounter: Payer: Self-pay | Admitting: Neurology

## 2023-01-15 ENCOUNTER — Ambulatory Visit: Payer: Medicaid Other | Admitting: Neurology

## 2023-01-15 VITALS — BP 123/81 | HR 67 | Ht 60.0 in | Wt 151.4 lb

## 2023-01-15 DIAGNOSIS — F39 Unspecified mood [affective] disorder: Secondary | ICD-10-CM

## 2023-01-15 DIAGNOSIS — R413 Other amnesia: Secondary | ICD-10-CM | POA: Diagnosis not present

## 2023-01-15 MED ORDER — CITALOPRAM HYDROBROMIDE 10 MG PO TABS: 10.0000 mg | ORAL_TABLET | Freq: Every day | ORAL | 5 refills | Status: DC

## 2023-01-15 MED ORDER — MEMANTINE HCL 10 MG PO TABS: 10.0000 mg | ORAL_TABLET | Freq: Two times a day (BID) | ORAL | 1 refills | Status: DC

## 2023-01-15 MED ORDER — DONEPEZIL HCL 10 MG PO TABS: 10.0000 mg | ORAL_TABLET | Freq: Every day | ORAL | 1 refills | Status: DC

## 2023-01-15 NOTE — Patient Instructions (Addendum)
 Restart Celexa  at 10 mg daily, we can increase if needed for the mood. Let me know. Restart Aricept  10 mg at bedtime for memory. Restart Namenda  10 mg twice daily (restart 5 mg twice daily for 2 weeks, then go to full dose). Reintroduce 1 medication weekly.  I will check on neuropsych report from Dr. Corina   I recommend against driving  Call about CPAP, needs to get started  DME: [facility]  Phone: [phone], press option 1 Fax: [phone]  Follow up with me 6 months

## 2023-02-06 ENCOUNTER — Other Ambulatory Visit: Payer: Self-pay | Admitting: Neurology

## 2023-03-04 ENCOUNTER — Encounter: Payer: Self-pay | Admitting: Neurology

## 2023-03-06 ENCOUNTER — Encounter: Payer: Medicaid Other | Admitting: Psychology

## 2023-03-25 ENCOUNTER — Encounter: Payer: Self-pay | Admitting: Neurology

## 2023-03-25 ENCOUNTER — Ambulatory Visit: Payer: Medicaid Other | Admitting: Neurology

## 2023-03-25 VITALS — BP 118/73 | HR 60 | Ht 62.0 in | Wt 152.0 lb

## 2023-03-25 DIAGNOSIS — R413 Other amnesia: Secondary | ICD-10-CM

## 2023-03-25 DIAGNOSIS — F39 Unspecified mood [affective] disorder: Secondary | ICD-10-CM

## 2023-03-25 DIAGNOSIS — F03918 Unspecified dementia, unspecified severity, with other behavioral disturbance: Secondary | ICD-10-CM

## 2023-03-25 DIAGNOSIS — G4733 Obstructive sleep apnea (adult) (pediatric): Secondary | ICD-10-CM | POA: Diagnosis not present

## 2023-03-25 MED ORDER — CITALOPRAM HYDROBROMIDE 20 MG PO TABS: 20.0000 mg | ORAL_TABLET | Freq: Every day | ORAL | 5 refills | Status: DC

## 2023-03-25 NOTE — Patient Instructions (Addendum)
 Increase Celexa to 20 mg for the mood Screen for AD with ATN profile Recommend using CPAP nightly for minimum 4 hours  I will pull a CPAP download  Referral to psychiatry  Follow in July

## 2023-03-25 NOTE — Progress Notes (Signed)
 Patient: Adriana Ramsey Date of Birth: April 15, 1965  Reason for Visit: Follow up History from: Patient, husband, Adriana Ramsey.  Primary Neurologist: Adriana Ramsey   ASSESSMENT AND PLAN 57 y.o. year old female   1.  Memory loss with mood/behavioral disturbance  -MoCA was 20/30 in January 2025 -Continues with memory difficulty, very emotional -Screen with ATN profile -I will increase Celexa back to 20 mg daily as Dr. Delena Ramsey had ordered (currently on 10 mg daily).  Referral to psychiatry for mood management. -Scheduled to see Dr. Kieth Ramsey in May 2025 for neuropsychological test to review with feedback, initial appointment in August 2024 (family mentions appointment delayed due to insurance issue -Continue Aricept 10 mg at bedtime, Namenda 10 mg twice a day -Have recommended against driving -Have recommended brain stimulating activity, exercise, healthy eating, management of vascular risk factors  2.  OSA on CPAP (HST 12/17/2022 that showed severe OSA AHI 30.6/hour and O2 nadir of 77%. Setup 01/27/23) -Suboptimal compliance -Have discussed the importance of nightly usage for minimum 4 hours -Will pull a download in 1 month and contact the patient to review -Keep follow-up appointment meet with me in July  Orders Placed This Encounter  Procedures   ATN PROFILE   Ambulatory referral to Psychiatry   HISTORY OF PRESENT ILLNESS: Today 03/25/23 Had HST 12/17/2022 that showed severe OSA AHI 30.6/hour and O2 nadir of 77%. Setup 01/27/23. Struggling to adjust. Compliance is not great, greater than 4-hour usage is 20%, AHI 6.5, leak 34. Doesn't sleep well with it on when she uses it all night, the next day she is calmer, not as irritable. Memory issues are about the same. Using nasal pillow mask. Husband helps her put it on at night, husband will hear it at night blowing. Continues on Celexa 10 mg daily, continues to be emotional, crying. On Aricept and Namenda. Mentions some issues with out of  state Medicaid.   Update 01/15/23 SS: Saw Dr. Kieth Ramsey in August 2024 for neuropsych consultation.  Had testing a few days later, results will be shared May 2025?  Had HST 12/17/2022 that showed severe OSA AHI 30.6/hour and O2 nadir of 77%.  Has not started CPAP yet.. Today MOCA 20/30. On bad days, emotional, easily irritable, poor memory. Is tearful today. Today is emotional, crying about her memory. Her husband has taken permanent leave of absence. Feels a weight on her family. Does her own ADLs. She helps with household tasks. Doesn't drive often, drives to her moms house down the street. Her son lives with her. Has been out of Celexa for several months, unclear about Aricept and Namenda.   Update 06/05/22 SS: MRI of the brain showed minor age-related changes.  Never underwent neuropsychological evaluation or sleep consult.  Had sent MyChart message in April 2024 daughter reporting extreme anxiety, crying.  She was started on Celexa. Apparently her daughter tried to set up her neuro psy testing and she didn't want to go. Didn't want to do sleep study, she snores. MOCA 20/30 today. " Has her moments" with her memory, because she knows its not as good. Husband manages appointments/medications. She asks the same questions. Her husband has taken some time off due to patient's emotional issues, she was calling him all the time. She does her ADLs, she does laundry, cooking. She is driving, she gets lost. On Aricept 5 mg daily, Namenda 10 mg daily. We are not sure why the Aricept got lowered from 10 to 5. 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 We called her daughter on Face time, Adriana Ramsey.   HISTORY  11/20/21 Dr. Delena Ramsey Brief HPI: 58 year old female with a history of HTN who follows in clinic for memory loss.    At her last visit, brain MRI was ordered. She was referred to neuropsychology. Referral to sleep was placed to assess for OSA. She was started on donepezil for  memory loss.   Interval History: She has personally not noticed any difference in her memory, however her family states she has continued to decline. She has not noticed any improvement in her memory since starting donepezil. She repeats the same questions within minutes of each other. Will forget appointments. Has left the stove on multiple times. Anxiety seems worse as well, and she has been crying daily. She has been more irritable toward her family.  REVIEW OF SYSTEMS: Out of a complete 14 system review of symptoms, the patient complains only of the following symptoms, and all other reviewed systems are negative.  See HPI  ALLERGIES: Allergies  Allergen Reactions   Elemental Sulfur Swelling   Ibuprofen Itching   Penicillins Hives and Swelling    HOME MEDICATIONS: Outpatient Medications Prior to Visit  Medication Sig Dispense Refill   citalopram (CELEXA) 10 MG tablet Take 1 tablet (10 mg total) by mouth daily. 30 tablet 5   donepezil (ARICEPT) 10 MG tablet Take 1 tablet (10 mg total) by mouth at bedtime. 90 tablet 1   hydrochlorothiazide (HYDRODIURIL) 25 MG tablet Take 1 tablet (25 mg total) daily by mouth. 90 tablet 2   memantine (NAMENDA) 10 MG tablet Take 1 tablet (10 mg total) by mouth 2 (two) times daily. 180 tablet 1   amLODipine (NORVASC) 5 MG tablet Take 5 mg by mouth daily. (Patient not taking: Reported on 03/25/2023)     No facility-administered medications prior to visit.    PAST MEDICAL HISTORY: Past Medical History:  Diagnosis Date   Essential hypertension    Fatty liver    Gallbladder polyp 2012   Last ultrasound 2015, no evidence of gallbladder polyp   GERD (gastroesophageal reflux disease)    Memory loss    PUD (peptic ulcer disease) 2011   Seasonal allergies     PAST SURGICAL HISTORY: Past Surgical History:  Procedure Laterality Date   BREAST REDUCTION SURGERY  2007   CESAREAN SECTION     2   COLONOSCOPY N/A 03/14/2016   Procedure: COLONOSCOPY;   Surgeon: Adriana Bali, MD;  mild diverticulosis in the sigmoid and descending colon, redundant rectosigmoid colon, external and internal hemorrhoids.  Due for repeat colonoscopy in 2028.    ESOPHAGOGASTRODUODENOSCOPY  11/23/2009   NUR: hypopharyngeal erosions of unclear sidnificance/mild changes of reflux /small sliding hiatal hernia and non Schatzki ring/3-mm gastric ulcer along the antrum with 2 scars and few erosions. h.pyori serologies negative   ESOPHAGOGASTRODUODENOSCOPY N/A 04/21/2019   Procedure: ESOPHAGOGASTRODUODENOSCOPY (EGD);  Surgeon: Adriana Bali, MD;  Moderate Schatzki's ring s/p dilation, small hiatal hernia, otherwise normal exam.   PARTIAL HYSTERECTOMY     REDUCTION MAMMAPLASTY Bilateral    SAVORY DILATION N/A 04/21/2019   Procedure: SAVORY DILATION;  Surgeon: Adriana Bali, MD;  [facility];  Service: Endoscopy;  Laterality: N/A;    FAMILY HISTORY: Family History  Problem Relation Age of Onset   Hyperlipidemia Mother    Seizures Mother    Hypertension Father    Heart attack Maternal Grandfather  72   Colon cancer Neg Hx    Liver disease Neg Hx     SOCIAL HISTORY: Social History   Socioeconomic History   Marital status: Married    Spouse name: Not on file   Number of children: 2   Years of education: Not on file   Highest education level: Some college, no degree  Occupational History   Occupation: Research scientist (medical): [employer]  Tobacco Use   Smoking status: Never   Smokeless tobacco: Never  Vaping Use   Vaping status: Never Used  Substance and Sexual Activity   Alcohol use: Yes    Comment: wine 1 glass per week   Drug use: No   Sexual activity: Yes  Other Topics Concern   Not on file  Social History Narrative   Lives w/ husband w/ one of their children.    Right-handed.   Caffeine use: one cup per day.    Social Drivers of Corporate investment banker Strain: Not on file  Food Insecurity: Not on  file  Transportation Needs: Not on file  Physical Activity: Not on file  Stress: Not on file  Social Connections: Not on file  Intimate Partner Violence: Not on file   PHYSICAL EXAM  Vitals:   03/25/23 1241  BP: 118/73  Pulse: 60  Weight: 152 lb (68.9 kg)  Height: 5\' 2"  (1.575 m)     Body mass index is 27.8 kg/m.    01/15/2023   10:04 AM 06/05/2022    2:05 PM 08/27/2021   10:42 AM 06/15/2021   10:08 AM  Montreal Cognitive Assessment   Visuospatial/ Executive (0/5) 3 3 2 2   Naming (0/3) 3 3 3 3   Attention: Read list of digits (0/2) 2 1 2 2   Attention: Read list of letters (0/1) 1 1 1 1   Attention: Serial 7 subtraction starting at 100 (0/3) 1 3 3 3   Language: Repeat phrase (0/2) 2 2 2 2   Language : Fluency (0/1) 1 1 1 1   Abstraction (0/2) 1 1 2 1   Delayed Recall (0/5) 0 0 0 0  Orientation (0/6) 6 5 6 5   Total 20 20 22 20   Adjusted Score (based on education)  20  20   Generalized: Well developed, in no acute distress  Neurological examination  Mentation: Alert oriented to time, place, is a limited historian doesn't go into detail, husband provides most information. Looks irritated.  Follows all commands speech and language fluent Cranial nerve II-XII: Pupils were equal round reactive to light. Extraocular movements were full, visual field were full on confrontational test. Facial sensation and strength were normal. Head turning and shoulder shrug  were normal and symmetric. Motor: The motor testing reveals 5 over 5 strength of all 4 extremities. Good symmetric motor tone is noted throughout.  Sensory: Sensory testing is intact to soft touch on all 4 extremities. No evidence of extinction is noted.  Coordination: Cerebellar testing reveals good finger-nose-finger and heel-to-shin bilaterally.  Gait and station: Gait is normal.  Reflexes: Deep tendon reflexes are symmetric and normal bilaterally.   DIAGNOSTIC DATA (LABS, IMAGING, TESTING) - I reviewed patient records, labs,  notes, testing and imaging myself where available.  Lab Results  Component Value Date   WBC 3.8 (L) 01/22/2022   HGB 14.6 01/22/2022   HCT 44.9 01/22/2022   MCV 84.9 01/22/2022   PLT 324 01/22/2022      Component Value Date/Time   NA 136 01/22/2022 1559  K 2.8 (L) 01/22/2022 1559   CL 97 (L) 01/22/2022 1559   CO2 30 01/22/2022 1559   GLUCOSE 93 01/22/2022 1559   BUN 11 01/22/2022 1559   CREATININE 0.71 01/22/2022 1559   CREATININE 0.85 03/25/2017 0913   CALCIUM 9.7 01/22/2022 1559   PROT 7.7 03/25/2017 0913   ALBUMIN 4.5 09/06/2016 0848   AST 30 03/25/2017 0913   ALT 32 (H) 03/25/2017 0913   ALKPHOS 59 09/06/2016 0848   BILITOT 0.7 03/25/2017 0913   GFRNONAA >60 01/22/2022 1559   GFRNONAA 78 09/06/2016 0848   GFRAA >89 09/06/2016 0848   Lab Results  Component Value Date   CHOL 189 03/25/2017   HDL 44 (L) 03/25/2017   LDLCALC 117 (H) 03/25/2017   TRIG 165 (H) 03/25/2017   CHOLHDL 4.3 03/25/2017   Lab Results  Component Value Date   HGBA1C 5.8 (H) 02/07/2016   Lab Results  Component Value Date   VITAMINB12 472 06/15/2021   Lab Results  Component Value Date   TSH 1.92 02/07/2016    Margie Ege, AGNP-C, DNP 03/25/2023, 12:48 PM [facility] [address] [place], [place] [address] [phone]

## 2023-03-28 LAB — ATN PROFILE
A -- Beta-amyloid 42/40 Ratio: 0.099 — ABNORMAL LOW (ref >0.102)
Beta-amyloid 40: 161.6 pg/mL
Beta-amyloid 42: 16.02 pg/mL
N -- NfL, Plasma: 4.3 pg/mL — ABNORMAL HIGH (ref 0.00–3.78)
T -- p-tau181: 2.34 pg/mL — ABNORMAL HIGH (ref 0.00–0.97)

## 2023-04-01 ENCOUNTER — Telehealth: Payer: Self-pay | Admitting: Neurology

## 2023-04-01 DIAGNOSIS — F03918 Unspecified dementia, unspecified severity, with other behavioral disturbance: Secondary | ICD-10-CM

## 2023-04-01 NOTE — Telephone Encounter (Signed)
 I spoke with the daughter.  Will go ahead and order blood test along with amyloid PET scan.  Depending on results, she may be a candidate for [place].  Orders Placed This Encounter  Procedures   NM PET Brain Amyloid   APOE Alzheimer's Risk

## 2023-04-01 NOTE — Addendum Note (Signed)
 Addended by: Glean Salvo on: 04/01/2023 02:20 PM   Modules accepted: Orders

## 2023-04-01 NOTE — Telephone Encounter (Signed)
 Called and spoke to daughter and relayed Sarah's recommendations. Daughter is agreeable to pet scan and further lab work.   Routing to sarah slack

## 2023-04-01 NOTE — Telephone Encounter (Signed)
 I tried to contact the patient and then tried to call her husband.  No answer.  Was forwarded to voicemail. Left a message.  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  Would like to discuss obtaining amyloid PET scan as well as APOE blood work risk assessment when they call back.

## 2023-04-02 ENCOUNTER — Telehealth: Payer: Self-pay | Admitting: Neurology

## 2023-04-02 NOTE — Telephone Encounter (Signed)
 no auth required sent to [facility] [phone]

## 2023-04-10 NOTE — Telephone Encounter (Signed)
 I had to call [employer] to submit a PA and had to fax them the clinical notes case #[id number] fax # [phone] phone # [phone] [facility] [place] Pet scan tracer is [address] [employer] [phone]

## 2023-04-11 ENCOUNTER — Encounter (HOSPITAL_COMMUNITY)

## 2023-04-14 ENCOUNTER — Encounter: Payer: Self-pay | Admitting: Neurology

## 2023-04-15 NOTE — Telephone Encounter (Signed)
 Called and spoke to [employer] at [phone] case #[id number] asked for a verbal reconsideration from the Nurse reviewer who stated that they can do a peer to peer or a verbal consideration can do one or the other. They want pt on amyloid reducing medication (per the denia)l  I see that they take memantine and aricept but I wasn't completely sure if they were amyloid reducing medication such as lequembi. I spoke w/April rn who stated that she could do a peer to peer but to the insurance I clarified that a peer to peer is with their neurologist to our APP they scheduled the peer to peer at 7:30am for 04/16/23. Dr. Jake Shark will call and the call can be a few mins earlier or later than the 7:30 time slot. Will route this back to sarah slack NP

## 2023-04-16 NOTE — Telephone Encounter (Signed)
 I did a peer to peer. Amyloid PET scan was approved through 06/09/23. [id number]. I was able to get approval after sharing that the [facility] range has been extended 18-25 for [place] inclusion. Originally denied because MOCA was 20.

## 2023-04-18 ENCOUNTER — Encounter (HOSPITAL_COMMUNITY)
Admission: RE | Admit: 2023-04-18 | Discharge: 2023-04-18 | Disposition: A | Discharge status: Home / Self Care | Source: Ambulatory Visit | Attending: Neurology | Admitting: Neurology

## 2023-04-18 DIAGNOSIS — F03918 Unspecified dementia, unspecified severity, with other behavioral disturbance: Secondary | ICD-10-CM | POA: Diagnosis present

## 2023-04-18 MED ORDER — FLORBETAPIR F 18 500-1900 MBQ/ML IV SOLN
9.1820 | Freq: Once | INTRAVENOUS | Status: AC
  Administered 2023-04-18: 9.182 via INTRAVENOUS

## 2023-04-29 ENCOUNTER — Telehealth: Payer: Self-pay | Admitting: Neurology

## 2023-04-29 ENCOUNTER — Encounter: Payer: Self-pay | Admitting: Neurology

## 2023-04-29 DIAGNOSIS — G309 Alzheimer's disease, unspecified: Secondary | ICD-10-CM

## 2023-04-29 NOTE — Telephone Encounter (Signed)
 Amyloid PET scan was positive. I called her daughter. Needs to come for APOE Alzheimer's risk panel. She is interested in [place].   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

## 2023-04-29 NOTE — Telephone Encounter (Signed)
 Okay to proceed with a repeat brain MRI for comparison.  Keep in mind that contributors to memory loss and dementia progression may be untreated obstructive sleep apnea in her case.  We can certainly do APOE testing and MRI brain.

## 2023-04-30 NOTE — Addendum Note (Signed)
 Addended by: Genora Kidd on: 04/30/2023 07:16 AM   Modules accepted: Orders

## 2023-04-30 NOTE — Addendum Note (Signed)
 Addended by: Wess Hammed on: 04/30/2023 08:11 AM   Modules accepted: Orders

## 2023-04-30 NOTE — Telephone Encounter (Signed)
 Call to daughter, she is in agreement with MRI and APOE lab

## 2023-05-01 ENCOUNTER — Other Ambulatory Visit: Payer: Self-pay

## 2023-05-01 ENCOUNTER — Other Ambulatory Visit (INDEPENDENT_AMBULATORY_CARE_PROVIDER_SITE_OTHER): Payer: Self-pay

## 2023-05-01 ENCOUNTER — Ambulatory Visit (HOSPITAL_BASED_OUTPATIENT_CLINIC_OR_DEPARTMENT_OTHER): Payer: Self-pay | Admitting: Psychiatry

## 2023-05-01 ENCOUNTER — Encounter (HOSPITAL_COMMUNITY): Payer: Self-pay | Admitting: Psychiatry

## 2023-05-01 VITALS — BP 114/77 | HR 69 | Ht 60.0 in | Wt 152.0 lb

## 2023-05-01 DIAGNOSIS — R413 Other amnesia: Secondary | ICD-10-CM

## 2023-05-01 DIAGNOSIS — F064 Anxiety disorder due to known physiological condition: Secondary | ICD-10-CM

## 2023-05-01 DIAGNOSIS — F03918 Unspecified dementia, unspecified severity, with other behavioral disturbance: Secondary | ICD-10-CM

## 2023-05-01 DIAGNOSIS — Z0289 Encounter for other administrative examinations: Secondary | ICD-10-CM

## 2023-05-01 NOTE — Progress Notes (Signed)
 Psychiatric Initial Adult Assessment   Patient Location: Office Provider Location Office  Patient Identification: Adriana Ramsey  MRN:  [id number] Date of Evaluation:  05/01/2023 Referral Source: Jeanmarie Millet Chief Complaint:   Chief Complaint  Patient presents with   Establish Care   Anxiety   Visit Diagnosis:    ICD-10-CM   1. Memory loss  R41.3     2. 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  Patient denied any previous history of psychiatric illness.  Patient came today with her husband Nadean August who provided most of the information.  Patient admitted having memory issues for a while and sometimes she gets tearful and crying because of her general health.  She was not able to finish the paperwork but reported anxiety due to her not able to do too many things.  She reported very much dependent on her husband who does everything for her.  They been married for more than 36 years.  Patient is able to cook, clean, do ADLs very well.  She denies any irritability, anger, agitation, hallucination, paranoia, suicidal thoughts.  She denies any panic attack.  During the conversation patient appears to be tearful and when I ask why she admitted sometimes her memory issues make her more anxious.  She denies any delusions, aggressive behavior.  Since she was given the diagnosis of dementia she is trying to do puzzles to keep herself active.  She is no longer driving other than visiting her mother who lives just 3 houses down.  She has 2 grandkids who are 6-year-old and 31 year old and she enjoys their company.  Her son lives with them who works from home.  Patient husband told that she is struggling with forgetfulness for more than a 2-year and it was noticeable at her work.  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  Husband told that there are times she got frustrated when she does not remember and husband tried to remind her the same thing multiple times.  Patient husband told they have put a tracker on her phone since there is a incident last year when she was found to lost near her house.  Now they have decided not to let her go by herself.  Husband denies any previous history of psychiatric illness, inpatient, suicidal time.  Her provider started her on Celexa  10 mg and recently increase 20 mg.  Husband reported since the dose increase her crying spells are less and she is feeling less anxious.  Husband is very well aware that dementia process is irreversible but with the help of medication it can be delayed.  He is trying to be as supportive as much and monitor her activities.  Patient has been told that patient does not let him go for too long because patient get more nervous when he is not around.  Patient husband denies any aggression, paranoia or delusions and reported patient is sleeping better since started using CPAP which initially she refused.  No change in appetite.  Not taking any pain medication.  There is a strong family history of Alzheimer's.  Patient's mother who is 66 year old had dementia and 2 of her aunt has dementia.  So far husband reported no major concern from the Celexa  20 mg prescribed  by neurology.  Patient also scheduled to have neuropsych testing next month by Dr. Cheryll Corti.  Associated Signs/Symptoms: Depression Symptoms:  difficulty concentrating, impaired memory, anxiety, (Hypo) Manic Symptoms:  Distractibility, Anxiety Symptoms:  Excessive Worry, Psychotic Symptoms:   none reported PTSD Symptoms: Negative  Past Psychiatric History: No history of suicidal attempt, psychosis, mania, depression.  Never seen psychiatrist and never been in therapy.  Neurology started Celexa  10 mg and recently  increased 20 mg for anxiety.  No history of drug use, legal issues, traumatic brain injury, headaches.  Previous Psychotropic Medications: No   Substance Abuse History in the last 12 months:  No.  Consequences of Substance Abuse: NA  Past Medical History:  Past Medical History:  Diagnosis Date   Essential hypertension    Fatty liver    Gallbladder polyp 2012   Last ultrasound 2015, no evidence of gallbladder polyp   GERD (gastroesophageal reflux disease)    Memory loss    PUD (peptic ulcer disease) 2011   Seasonal allergies     Past Surgical History:  Procedure Laterality Date   BREAST REDUCTION SURGERY  2007   CESAREAN SECTION     2   COLONOSCOPY N/A 03/14/2016   Procedure: COLONOSCOPY;  Surgeon: Alyce Jubilee, MD;  mild diverticulosis in the sigmoid and descending colon, redundant rectosigmoid colon, external and internal hemorrhoids.  Due for repeat colonoscopy in 2028.    ESOPHAGOGASTRODUODENOSCOPY  11/23/2009   NUR: hypopharyngeal erosions of unclear sidnificance/mild changes of reflux /small sliding hiatal hernia and non Schatzki ring/3-mm gastric ulcer along the antrum with 2 scars and few erosions. h.pyori serologies negative   ESOPHAGOGASTRODUODENOSCOPY N/A 04/21/2019   Procedure: ESOPHAGOGASTRODUODENOSCOPY (EGD);  Surgeon: Alyce Jubilee, MD;  Moderate Schatzki's ring s/p dilation, small hiatal hernia, otherwise normal exam.   PARTIAL HYSTERECTOMY     REDUCTION MAMMAPLASTY Bilateral    SAVORY DILATION N/A 04/21/2019   Procedure: SAVORY DILATION;  Surgeon: Alyce Jubilee, MD;  Location: [facility];  Service: Endoscopy;  Laterality: N/A;    Family Psychiatric History: None reported.  Mother had seizure disorder.  Family History:  Family History  Problem Relation Age of Onset   Hyperlipidemia Mother    Seizures Mother    Hypertension Father    Heart attack Maternal Grandfather        79   Colon cancer Neg Hx    Liver disease Neg Hx     Social History:    Social History   Socioeconomic History   Marital status: Married    Spouse name: Not on file   Number of children: 2   Years of education: Not on file   Highest education level: Some college, no degree  Occupational History   Occupation: Research scientist (medical): [employer]  Tobacco Use   Smoking status: Never   Smokeless tobacco: Never  Vaping Use   Vaping status: Never Used  Substance and Sexual Activity   Alcohol use: Yes    Comment: very rare   Drug use: No   Sexual activity: Yes  Other Topics Concern   Not on file  Social History Narrative   Lives w/ husband w/ one of their children.    Right-handed.   Caffeine use: one cup per day.    Social Drivers of Corporate investment banker Strain: Not on file  Food Insecurity: Not on file  Transportation Needs: Not on file  Physical Activity: Not on file  Stress: Not on file  Social Connections: Not on file    Additional Social History: Patient born and raised in [place] .  She has been married with her husband for more than 36 years.  They have a daughter and son and 2 grandkids.  Patient finished high school and some college.  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  Patient lives with her husband and her son works from home and lives there.  Patient is very close to her grandkids.  Allergies:   Allergies  Allergen Reactions   Elemental Sulfur Swelling   Ibuprofen Itching   Penicillins Hives and Swelling    Metabolic Disorder Labs: Lab Results  Component Value Date   HGBA1C 5.8 (H) 02/07/2016   MPG 120 02/07/2016   MPG 128 (H) 06/21/2014   No results found for: "PROLACTIN" Lab Results  Component Value Date   CHOL 189 03/25/2017   TRIG 165 (H) 03/25/2017   HDL 44 (L) 03/25/2017   CHOLHDL 4.3 03/25/2017   VLDL 35 (H) 09/06/2016   LDLCALC 117 (H) 03/25/2017   LDLCALC 130 (H) 11/19/2016   Lab Results   Component Value Date   TSH 1.92 02/07/2016    Therapeutic Level Labs: No results found for: "LITHIUM" No results found for: "CBMZ" No results found for: "VALPROATE"  Current Medications: Current Outpatient Medications  Medication Sig Dispense Refill   amLODipine (NORVASC) 5 MG tablet Take 5 mg by mouth daily.     citalopram  (CELEXA ) 20 MG tablet Take 1 tablet (20 mg total) by mouth daily. 30 tablet 5   donepezil  (ARICEPT ) 10 MG tablet Take 1 tablet (10 mg total) by mouth at bedtime. 90 tablet 1   hydrochlorothiazide  (HYDRODIURIL ) 25 MG tablet Take 1 tablet (25 mg total) daily by mouth. 90 tablet 2   memantine  (NAMENDA ) 10 MG tablet Take 1 tablet (10 mg total) by mouth 2 (two) times daily. 180 tablet 1   No current facility-administered medications for this visit.    Musculoskeletal: Strength & Muscle Tone: within normal limits Gait & Station: normal Patient leans: N/A  Psychiatric Specialty Exam: Review of Systems  Neurological:        Memory problem    Blood pressure 114/77, pulse 69, height 5' (1.524 m), weight 152 lb (68.9 kg).Body mass index is 29.69 kg/m.  General Appearance: Casual and at times emotional  Eye Contact:  Fair  Speech:  Slow  Volume:  Decreased  Mood:  Anxious  Affect:  Appropriate  Thought Process:  Goal Directed  Orientation:  Full (Time, Place, and Person)  Thought Content:  WDL  Suicidal Thoughts:  No  Homicidal Thoughts:  No  Memory:  Immediate;   Fair Recent;   Fair Remote;   Fair  Judgement:  Intact  Insight:  Present  Psychomotor Activity:  Decreased  Concentration:  Concentration: Fair and Attention Span: Fair  Recall:  [employer] of Knowledge:Fair  Language: Good  Akathisia:  No  Handed:  Right  AIMS (if indicated):  not done  Assets:  Communication Skills Desire for Improvement Housing Social Support  ADL's:  Intact  Cognition: Impaired,  Mild  Sleep:   ok with CPAP   Screenings:    01/15/2023   10:04 AM 06/05/2022     2:05 PM 08/27/2021   10:42 AM 06/15/2021   10:08 AM  Montreal Cognitive Assessment   Visuospatial/ Executive (0/5) 3 3 2 2   Naming (0/3)  3 3 3 3   Attention: Read list of digits (0/2) 2 1 2 2   Attention: Read list of letters (0/1) 1 1 1 1   Attention: Serial 7 subtraction starting at 100 (0/3) 1 3 3 3   Language: Repeat phrase (0/2) 2 2 2 2   Language : Fluency (0/1) 1 1 1 1   Abstraction (0/2) 1 1 2 1   Delayed Recall (0/5) 0 0 0 0  Orientation (0/6) 6 5 6 5   Total 20 20 22 20   Adjusted Score (based on education)  20  20    GAD-7    Flowsheet Row [facility]/01/20[facility] in [facility]  Total GAD-7 Score 4      PHQ2-9    Flowsheet Row Office Visit from 05/01/2023 in [facility] Office Visit from 03/25/2017 in [facility] Office Visit from 01/22/2017 in [facility] Office Visit from 11/19/2016 in [facility] Office Visit from 02/07/2016 in [facility]  PHQ-2 Total Score 0 0 0 0 0  PHQ-9 Total Score -- -- -- 0 0      Flowsheet Row Office Visit from 05/01/2023 in [facility] ED from 01/22/2022 in [facility]  C-SSRS RISK CATEGORY No Risk No Risk       Assessment and Plan: Patient is 58 year old African-American female who is referred from neurology for anxiety and psychiatric evaluation.  She is currently on Celexa  20 mg prescribed by neurology and so far tolerating very well and no side effects.  I discussed in detail with her husband and patient about prognosis of dementia and psychotropic medication side effects.  I encouraged to keep appointment with Dr. Cheryll Corti for neuropsych testing.  Patient's husband is very well aware about dementia and trying to be as supportive as he can.  We discussed caretaker burden and patient has support from her son and  daughter.  At this time I will not change the medication since Celexa  20 mg working and helping.  I did recommend consider therapy if patient comfortable to see a counselor however husband endorse that patient sometimes gets overwhelmed with multiple appointments.  I encourage to keep an appointment with this writer in 6 months to see the progress however if any changes or symptoms get worse then he can call us  to get an earlier appointment.  I also discussed any thoughts of suicide, homicide's or aggression or violence then he need to call us  back or go to local emergency room.  Patient and husband agree with the plan.  Follow-up in 6 months.  I will also forward my note to her provider.  Collaboration of Care: Other provider involved in patient's care AEB notes are available in epic to review  Patient/Guardian was advised Release of Information must be obtained prior to any record release in order to collaborate their care with an outside provider. Patient/Guardian was advised if they have not already done so to contact the registration department to sign all necessary forms in order for us  to release information regarding their care.   Consent: Patient/Guardian gives verbal consent for treatment and assignment of benefits for services provided during this visit. Patient/Guardian expressed understanding and agreed to proceed.   I provided 59 minutes face to face time during this encounter.  Arturo Late, MD 4/24/20259:06 AM

## 2023-05-10 LAB — APOE ALZHEIMER'S RISK

## 2023-05-13 ENCOUNTER — Encounter: Payer: Self-pay | Admitting: Neurology

## 2023-05-31 ENCOUNTER — Other Ambulatory Visit

## 2023-06-03 ENCOUNTER — Encounter: Payer: Medicaid Other | Attending: Psychology | Admitting: Psychology

## 2023-06-03 ENCOUNTER — Encounter: Payer: Self-pay | Admitting: Psychology

## 2023-06-03 ENCOUNTER — Ambulatory Visit: Payer: Medicaid Other | Admitting: Psychology

## 2023-06-03 DIAGNOSIS — G309 Alzheimer's disease, unspecified: Secondary | ICD-10-CM

## 2023-06-03 DIAGNOSIS — F02B0 Dementia in other diseases classified elsewhere, moderate, without behavioral disturbance, psychotic disturbance, mood disturbance, and anxiety: Secondary | ICD-10-CM

## 2023-06-03 DIAGNOSIS — F39 Unspecified mood [affective] disorder: Secondary | ICD-10-CM | POA: Diagnosis not present

## 2023-06-03 DIAGNOSIS — G4733 Obstructive sleep apnea (adult) (pediatric): Secondary | ICD-10-CM

## 2023-06-03 NOTE — Progress Notes (Signed)
 Neuropsychological Evaluation   Patient:  Adriana Ramsey    DOB: 12/16/1965  MR Number: [id number]  Location: [facility]  PHYSICAL MEDICINE AND REHABILITATION [address] [place], [address] [place] [address] Dept: [phone]  Start: 10 AM End: 11 AM  Provider/Observer:     Marrion Sjogren PsyD  Chief Complaint:      Chief Complaint  Patient presents with   Memory Loss   Anxiety   Depression    Reason For Service:     Adriana Ramsey  is a 58 year old female referred for neuropsychological evaluation by her neurologist, Dala Dublin, MD, due to ongoing and worsening memory loss, forgetfulness, disorientation, episodic confusion, and difficulties completing work-related tasks, ultimately leading to her cessation of employment.    Patient and family report that symptoms began in 2021, initially presenting as increased forgetfulness. Over time, her family has observed a decline, although they report no significant changes in the last six months. Previous MoCA testing yielded a score of 20/30.    Medical and Emotional History   The patient and her family report increased emotional distress, including anxiety, depression, crying spells, and general emotional instability. Her husband has taken a leave of absence to provide support.    She is currently on Aricept  and Namenda , which have reportedly been somewhat helpful, as well as Celexa , which has improved her emotional status. Additionally, she is following recommendations to undergo a formal sleep study to assess for possible obstructive sleep apnea, as her symptoms suggest this may be an underlying issue.    Clinical Interview Findings    During today's interview, the patient and her daughter confirmed that symptoms first emerged in 2021. Initially, patient struggled to locate misplaced items, though cueing sometimes helped her recall where they were. However,  she has experienced increasing instances of disorientation and confusion.    The patient's driving activities have been significantly reduced, limited to short distances and familiar routes. Although she has had no accidents, she previously became lost while driving to a familiar location to exchange her grandchildren--something she had done numerous times in the past. Given these difficulties, she is no longer responsible for these pickups.    The patient acknowledges episodes of asking repetitive questions and forgetting prior conversations. Despite medications, the past six to nine months have been marked by continued episodes of emotional distress, including heightened anxiety and crying spells, though some improvement has been noted more recently.    Family History    There is a notable family history of cognitive decline. Her aunt experienced worsening memory problems beginning in her 31s, while her grandmother exhibited progressive memory loss starting between the ages of 60 and 9, eventually living into her 47s.    Additional Health Concerns    The patient denies tremors, changes in gait, motor difficulties, or visual/auditory hallucinations. However, she experiences persistent sleep difficulties, including trouble falling asleep and an irregular sleep schedule. She reports staying up until 1-2 AM and waking at varying times, sometimes as late as 10:30 AM. She does not nap during the day.    Family members have observed significant snoring, raising concerns about possible obstructive sleep apnea. Although a formal sleep study has been recommended, it has not yet been scheduled. Given its potential impact on cognitive functioning, the importance of follow-up was emphasized.  Note 12/17/2022:  Patient met classification for severe obstructive sleep apnea and advised treatment of positive airway pressure device with plan  to proceed with AutoPap titration/trial at home.  Additionally, the  patient has reported dietary changes and a reduced food intake. Recommendations were made to improve her diet with nutrient-rich foods such as vegetables, whole grains, nuts, and seeds while minimizing processed foods and refined carbohydrates.    Medical History:                         Past Medical History:  Diagnosis Date   Essential hypertension     Fatty liver     Gallbladder polyp 2012    Last ultrasound 2015, no evidence of gallbladder polyp   GERD (gastroesophageal reflux disease)     Memory loss     PUD (peptic ulcer disease) 2011   Seasonal allergies                                                                 Patient Active Problem List    Diagnosis Date Noted   Memory loss 06/05/2022   Mood disorder (HCC) 06/05/2022   Schatzki's ring of distal esophagus     Chest heaviness 11/23/2018   Degenerative TFCC tear, right, differential diagnosis 04/29/2018   Lung nodules 11/19/2016   Encounter for screening colonoscopy 02/29/2016   Vitamin D  deficiency 09/20/2015   IBS (irritable bowel syndrome) 06/21/2014   Hypertriglyceridemia 03/23/2014   GERD (gastroesophageal reflux disease) 03/23/2014   De Quervain's syndrome (tenosynovitis) 11/02/2013   Fatty liver 11/29/2011   Gallbladder polyp 11/29/2011   Dysphagia 11/29/2011   Elevated LFTs 10/11/2011   Essential hypertension, benign 07/17/2011   Obesity (BMI 30-39.9) 07/17/2011   Hot flashes 07/17/2011   Anemia 07/17/2011      Onset and Duration of Symptoms: Symptoms are reported to have been developing around 2021 and the patient has stopped working because of her memory issues.   Progression of Symptoms: The patient and family have described progressing in her cognitive difficulties and memory loss but reports that over the past 6 months or so that there has not been appreciable change.   Additional Tests and Measures from other records:   Neuroimaging Results: The patient had an MRI of her brain conducted on  12/22/2021 interpreted by Ardella Beaver, MD.  The impressions from this MRI were of an unremarkable MRI scan of the brain with and without contrast showing only minor changes of chronic small vessel disease.  There were mild periventricular and subcortical nonspecific T2/FLAIR white matter hyperintensities which may have been seen due to a variety of potential conditions including small vessel disease, migraine, remote trauma, demyelinating process etc.  There was no indication of acute process.  These findings were indicative of only minor changes of chronic small vessel disease type issues.   Laboratory Tests: The patient has had blood work that have been relatively unremarkable, although she has had some readings with low potassium and some mildly elevated blood glucose readings in the past but they have been highest of 121 with most recent at 93.  Vitamin B12 was measured at 2023 and it was within normal limits.  Vitamin D  has also been followed through the years and it has been low at times over the past 8 years.  After the neuropsychological assessment was administered but before this formal report  was written the patient had the ATN blood work profile conducted.  The patient had a low beta amyloid ratio and high P tau 181 noted.  These blood work findings are potentially associated with Alzheimer's type pathology but need to be correlated with clinical symptoms.  The patient also had the APO E genotyping results performed.  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   Other Relevant Assessments: The patient did have some type of psychological/neuropsychological evaluation as part of her Social Security disability workup but we do not have access to those findings.   Sleep: The patient has had difficulties with onset of sleep and  usually does not go to sleep until 1 to 2 AM in the morning and can get up either around 730 or sleep until 10-10 30.  There have been concerns around obstructive sleep apnea and the patient is being set up for formal sleep study.  Attempt was made for formal sleep study on 11/04/2022 but the patient did not achieve any sleep.  Home sleep study was ordered.  Patient had an Epworth Sleepiness Scale 8/24.  During home sleep study patient had total sleep time of 3 hours and 12 minutes with 28.6% and REM sleep.  Patient had a minimum oxygen saturation of 77% with O2 saturation range 77-96%.  Patient had 4.5 minutes below 88%.  Patient met classification for severe obstructive sleep apnea and advised treatment of positive airway pressure device with plan to proceed with AutoPap titration/trial at home.  Noted patient is now compliant with CPAP use after initially refusing and reports that she is sleeping better since starting CPAP.  Tests Administered: Controlled Oral Word Association Test (COWAT; FAS & Animals)  Grooved Pegboard Wechsler Adult Intelligence Scale, 4th Edition (WAIS-IV) Wechsler Memory Scale, 4th Edition (WMS-IV); Adult Battery  Participation Level:   Active  Participation Quality:  Appropriate and although the patient became frustrated during the second half of the Wechsler Memory Scale's and some of the items including the verbal paired Associates and design delayed recall were not performed.  The patient acknowledged feeling very anxious during the testing and struggled to remember instructions.      Behavioral Observation:  The patient appeared well-groomed and appropriately dressed. Her manners were polite and appropriate to the situation. The patient's attitude towards testing was positive and her effort was good. The patient became frustrated during the WMS-IV and therefore did not complete the Verbal Paired Associates subtest or Designs II recall. The patient also reported feeling  anxious during testing and struggled to remember instructions at times.   Well Groomed, Confused, and anxious.   Test Results:   Was administered objective neuropsychological testing on 09/03/2022 prior to initiation of use of CPAP.  Initially, an estimation was made as to the patient's premorbid intellectual and cognitive abilities to provide a comparison point to current objective neuropsychological test data.  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  It is estimated that the patient likely functioned premorbidly in the high average range relative to a normative population as far as a conservative estimation.  We will utilize an estimation of average to high average levels of premorbid functioning for  comparisons to current test data.  Secondly, an estimation was made as to the validity of the current assessment procedures.  While the patient ultimately became very anxious and frustrated and the second half of formal memory testing she was generally anxious and vigilant of test performance during various portions of formal testing.  She did appear to try her hardest but became increasingly anxious and lateral portions.  While the patient clearly struggled with test instructions objective scores on memory test are not particularly interpretable directly due to emotional response and effort level due to anxiety and ultimately these testing procedures were discontinued.  However, the patient has initial performances on the controlled oral Word Association test, grooved pegboard test and Wechsler Adult Intelligence Scale's do appear to be generally interpretable.  They will be interpreted with caution.  COWAT:  FAS total= 41 Z= -0.10 Animals total= 12 Z= -1.65 In order to objectively assess expressive language  capacities including lexical and semantic fluency the patient was administered the control oral Word Association test.  On the FAS test, which assesses lexical fluency, the patient performed in the average range when matched for gender, age and education level.  The patient had much more significant difficulty on the animal naming test which assesses semantic fluency.  There were not particular indications during the clinical interview of significant expressive language deficits and receptive language appeared to be intact.  Grooved Pegboard:  R (DH) time= 104s  Percentile Rank= 26th L (NDH) time= 133s Percentile Rank=25th   While the patient has had no indications of significant cerebrovascular accident or acute process that her MRI studies she was administered the grooved pegboard test to assess fine motor control and potential issues associated with lateralization of difficulties.  The patient showed consistent performance between her right dominant hand and her left nondominant hand on this measure following in the 26th and 25th percentile respectively.  While there was some weakness with regard to fine motor control it was still within the lower end of the average range without indications of significant impairments for fine motor control or indications of differences between right side versus left side motor control.  WAIS-IV:            Composite Score Summary          Scale Sum of Scaled Scores Composite Score Percentile Rank 95% Conf. Interval Qualitative Description  Verbal Comprehension 17 VCI 76 5 71-83 Borderline  Perceptual Reasoning 14 PRI 69 2 64-77 Extremely Low  Working Memory 12 WMI 77 6 72-85 Borderline  Processing Speed 13 PSI 81 10 75-91 Low Average  Full Scale 56 FSIQ 71 3 68-76 Borderline  General Ability 31 GAI 70 2 66-76 Borderline    The results of the patient's performance on the Wechsler Adult Intelligence Scale-IV are interpreted with caution.  The patient was  generally anxious but much better on this portion of the assessment.  Premorbid estimates placed the patient in the average to high average range relative to a normative population.  The current results should be seen as demonstrative of her current status rather than descriptive of her premorbid functioning levels.  2 Global composite scores were calculated.  The patient produced a full-scale IQ score of 71 which falls at the 3rd percentile and is in the borderline range relative to a normative population.  This is more than 2 standard deviations below predicted levels of premorbid functioning and suggest multiple cognitive domains are being impacted beyond just memory functions.  We also  calculated the patient's general abilities index score which was a 70 and at the 2nd percentile.  This composite score places less emphasis on measures more more vulnerable to acute impacts including attention and concentration issues and information processing speed.  The patient produced a general abilities index score of 70 would suggest that cognitive difficulties are well beyond those simply explained by attention and concentration and information processing speed variables.          Verbal Comprehension Subtests Summary        Subtest Raw Score Scaled Score Percentile Rank Reference Group Scaled Score SEM  Similarities 22 8 25 9  1.08  Vocabulary 19 5 5 6  0.73  Information 4 4 2 5  0.67   The patient produced a verbal comprehension index score of 76 which falls at the 5th percentile in the borderline range relative to a normative population.  There was some scatter noted in subtest performance.  The patient had her best performance on measures of verbal reasoning and problem-solving capacity but displayed very poor performance on measures of vocabulary knowledge and general fund of information.  These are typically not items that are particularly initially impacted to a severe degree with Alzheimer's dementia type  patterns which would suggest at least some other process playing a role in her obtained scores currently.  Perceptual Reasoning Subtests Summary        Subtest Raw Score Scaled Score Percentile Rank Reference Group Scaled Score SEM  Block Design 6 2 0.4 2 1.04  Matrix Reasoning 10 7 16 5  0.95  Visual Puzzles 6 5 5 4  0.99    The patient produced a perceptual reasoning index score of 69 which falls at the 2nd percentile and is in the extremely low range relative to normative population.  The patient had severe difficulties with visual analysis and organizational capacity, visual reasoning and problem-solving capacity, performing whole-part analysis and impaired fluid visual intellectual challenges.  These clearly are below the patient's premorbid functioning level suggesting significant visual-spatial and visual constructional deficits.  Working Comptroller Raw Score Scaled Score Percentile Rank Reference Group Scaled Score SEM  Digit Span 23 8 25 7  0.85  Arithmetic 7 4 2 5  1.04    The patient produced a working memory index score of 77 which falls at the 6 percentile and is borderline range relative to normative population.  There was considerable scatter noted in subtest performance.  For example, the patient had one of her best individual performing subtests related to primary auditory encoding capacity.  However, this was still below predicted levels of premorbid functioning capacity.  On the arithmetic subtest, which requires active processing of information in her auditory Register the patient showed significant deficits.  Processing Speed Subtests Summary        Subtest Raw Score Scaled Score Percentile Rank Reference Group Scaled Score SEM  Symbol Search 9 3 1 2  1.31  Coding 58 10 50 7 0.99    The patient produced a processing speed index score of 81, which falls at the 10th percentile and is in the low average range relative to normative population.  This is  below premorbid estimates by more than 1 standard deviation.  There was considerable variability in subtest performance.  On one of the individual subtest the patient actually performed at the 50th percentile.  I suspect that this is a reliable measure for the most part and fell at the 50th percentile.  The  patient had another similar task where she performed quite poorly falling at the 1st percentile.  This variability on similar types of cognitive measures highlight the need to interpret her actual obtained scores with caution as other variables including frustration, anxiety, hypervigilance etc. are likely playing a role in the complete data set.  An attempt was made to administer the Wechsler Memory Scale-IV to obtain an objective assessment of various attention and memory components in a systematic and structured way.  However, the patient became increasingly anxious and frustrated during these measures and they were discontinued.  While the scores obtained below are included interpretation is guarded and about the only objective thing you can say about the patient's memory performances that there are significant impairments noted in anxiety, frustration and concern of her poor performance is at least a contributing factor in having a significant deleterious impact on memory and cognition.  Even with this caution and these other contributing factors the patient is clearly describing real-life situations with significant memory deficits that go beyond what could be explained solely by anxiety and emotional dyscontrol.     WMS-IV:          Index Score Summary        Index Sum of Scaled Scores Index Score Percentile Rank 95% Confidence Interval Qualitative Descriptor  Auditory Memory (AMI) 4 40 <0.1 37-49 Extremely Low  Visual Memory (VMI) 9 50 <0.1 46-58 Extremely Low  Visual Working Memory (VWMI) 11 73 4 68-82 Borderline  Immediate Memory (IMI) 9 47 <0.1 43-56 Extremely Low  Delayed Memory (DMI) 4  40 <0.1 37-50 Extremely Low           Primary Subtest Scaled Score Summary       Subtest Domain Raw Score Scaled Score Percentile Rank  Logical Memory I AM 7 1 0.1  Logical Memory II AM 0 1 0.1  Verbal Paired Associates I AM 0 1 0.1  Verbal Paired Associates II AM 0 1 0.1  Designs I VM 40 4 2  Designs II VM 0 1 0.1  Visual Reproduction I VM 19 3 1   Visual Reproduction II VM 0 1 0.1            Auditory Memory Process Score Summary      Process Score Raw Score Scaled Score Percentile Rank Cumulative Percentage (Base Rate)  LM II Recognition 14 - - <=2%  VPA II Recognition 0 - - <=2%         Visual Memory Process Score Summary      Process Score Raw Score Scaled Score Percentile Rank Cumulative Percentage (Base Rate)  DE I Content 25 5 5  -  DE I Spatial 11 6 9  -  DE II Content 0 1 0.1 -  DE II Spatial 0 1 0.1 -  DE II Recognition 9 - - 3-9%  VR II Recognition 1 - - <=2%     Summary of Results:   The results of the current neuropsychological evaluation, with guarded interpretation of objective memory testing, are consistent with significant cognitive deficits including significant deficits in visual-spatial and visual constructional capacity, significant deficits in capacity to retrieve longstanding information, significant executive functioning deficits.  The patient was unable to complete formal memory testing with frustration and emotional response noted before its discontinuation.  Even with that clear memory difficulties were noted and remembering and understanding test instructions etc. and her memory deficits are greater than those that could be simply explained by anxiety and hypervigilance.  Impression/Diagnosis:  The results of the current neuropsychological evaluation are consistent with multiple cognitive domains being impacted.  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   However, interpretation is guarded given the significant amount of anxiety and emotional distress, frustration noted on objective neuropsychological test data.  There was no indication of lateralization of findings or suggestions of significant cerebrovascular disease including significant microvascular ischemic changes.  The patient has had findings on HTN blood work profile consistent with those seen with individuals with Alzheimer's type pathology.  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  As far as diagnostic considerations, the most likely cause for her progressive cognitive decline would be an Alzheimer's type pathology although the patient has now been diagnosed with severe obstructive sleep apnea.  She has begun using CPAP consistently after initial hesitancy.  The patient notes that she is sleeping better but continues to describe significant cognitive deficits and memory deficits.  It will be important to continue to monitor after several months of consistent CPAP use to assess how much her severe obstructive sleep apnea was playing a role in both her progressive cognitive decline and emotional dysregulation as untreated severe obstructive sleep apnea can have a significant deleterious impact on these domains.  I will sit down with the patient and her family and go over the results of the current neuropsychological evaluation.  The patient has been placed on citalopram  to aid with mood dysregulation and continues to take benazepril and memantine  without significant side effects.  I will go over more specific instructions for the patient and her family outside of ongoing neurologic care.  It is very important that the patient continue to actively use her CPAP device as untreated sleep apnea is associated with faster progression of Alzheimer's type pathology.  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 Clinical Neuropsychologist

## 2023-06-04 ENCOUNTER — Encounter: Payer: Medicaid Other | Admitting: Psychology

## 2023-07-09 ENCOUNTER — Other Ambulatory Visit: Payer: Self-pay | Admitting: Neurology

## 2023-07-14 ENCOUNTER — Other Ambulatory Visit: Payer: Self-pay

## 2023-07-14 MED ORDER — MEMANTINE HCL 10 MG PO TABS: 10.0000 mg | ORAL_TABLET | Freq: Two times a day (BID) | ORAL | 1 refills | Status: AC

## 2023-07-17 ENCOUNTER — Encounter: Payer: Self-pay | Attending: Psychology | Admitting: Psychology

## 2023-07-17 ENCOUNTER — Encounter: Payer: Self-pay | Admitting: Psychology

## 2023-07-17 DIAGNOSIS — G309 Alzheimer's disease, unspecified: Secondary | ICD-10-CM | POA: Diagnosis present

## 2023-07-17 DIAGNOSIS — F02B0 Dementia in other diseases classified elsewhere, moderate, without behavioral disturbance, psychotic disturbance, mood disturbance, and anxiety: Secondary | ICD-10-CM | POA: Insufficient documentation

## 2023-07-17 DIAGNOSIS — G4733 Obstructive sleep apnea (adult) (pediatric): Secondary | ICD-10-CM | POA: Diagnosis present

## 2023-07-17 DIAGNOSIS — F39 Unspecified mood [affective] disorder: Secondary | ICD-10-CM | POA: Diagnosis not present

## 2023-07-17 NOTE — Progress Notes (Signed)
 Neuropsychological Evaluation   Patient:  Adriana Ramsey    DOB: [birth year]  MR Number: [id number]  Location: [facility] [place] [facility] [address] [place], [address] [place] [address] Dept: [phone]  Start: 4 PM End: 5 PM  Today's visit was conducted in my outpatient clinic office with the patient along with her husband Adriana Ramsey.  1 hour was spent in duration with this visit providing feedback regarding the results of the recent neuropsychological evaluation.  Provider/Observer:     Adriana JONELLE Asa PsyD  Chief Complaint:      Chief Complaint  Patient presents with   Memory Loss   Anxiety   Depression   Sleeping Problem   07/17/2023: Today I provided feedback regarding the results of the current neuropsychological evaluation.  Below the summation of today's visit you will also find a copy of the reason for service for the neuropsychological evaluation itself and a copy of the summary of that evaluation below for convenience.  The patient's entire neuropsychological evaluation can be found in her EMR dated 06/03/2023 with full background clinical history dated 08/20/2022.  Today's visit  Patient presents with a history of progressive memory changes over the last year and a half, described as a steady decline, now stable. Husband reports short-term memory deficits and repetitive behaviors, such as re-folding clothes. Patient also reports mood fluctuations and a history of anxiety and depression. Significant emotional distress and frustration were noted during the testing period, warranting caution in interpreting the results.  The neuropsychological evaluation results are consistent with an impact on multiple cognitive domains, not limited to memory. 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 This is supported  by collateral information including: - ATN blood work showing elevated P-[id number] and an unfavorable tau 40/42 ratio. 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 - Subjective reports from the patient and her husband. - Medical records review. No evidence of lateralization or significant cerebrovascular disease was found.  Patient has a recent diagnosis of severe obstructive sleep apnea and has started using a CPAP, though with some hesitancy. She reports sleeping better but no significant improvement in cognitive symptoms yet. Emphasized the critical importance of consistent CPAP use to slow potential cognitive decline, improve mood, and mitigate other health risks associated with untreated sleep apnea (e.g., hypertension, cardiac issues). Discussed CPAP vs. BiPAP vs. Inspire implant options.  Patient reports a family history of Alzheimer's disease (grandmother, mother, and father).  Plan: 1. Continue consistent, nightly use of CPAP machine. 2. Follow-up with neurologist, Dr. Rush, to discuss findings and potential medication/intervention options. Appointment is scheduled. 3. Re-evaluation scheduled in approximately nine months to monitor progression. 4. Provided education on lifestyle factors that can slow cognitive decline, including diet, exercise, sleep, and social/cognitive engagement. 5. Patient was provided with a copy of the neuropsychological report.  Background information derived from the reason for service and summary of evaluative report found below.  Reason For Service:     Adriana Ramsey  is a 58 year old female referred for neuropsychological evaluation by her neurologist, Adriana Rush, MD, due to ongoing and worsening memory loss, forgetfulness, disorientation, episodic confusion, and difficulties completing work-related tasks, ultimately leading to her cessation of employment.    Patient and family report that symptoms began in 2021, initially presenting  as increased forgetfulness. Over time, her family has observed a decline, although  they report no significant changes in the last six months. Previous MoCA testing yielded a score of 20/30.    Medical and Emotional History   The patient and her family report increased emotional distress, including anxiety, depression, crying spells, and general emotional instability. Her husband has taken a leave of absence to provide support.    She is currently on Aricept  and Namenda , which have reportedly been somewhat helpful, as well as Celexa , which has improved her emotional status. Additionally, she is following recommendations to undergo a formal sleep study to assess for possible obstructive sleep apnea, as her symptoms suggest this may be an underlying issue.    Clinical Interview Findings    During today's interview, the patient and her daughter confirmed that symptoms first emerged in 2021. Initially, patient struggled to locate misplaced items, though cueing sometimes helped her recall where they were. However, she has experienced increasing instances of disorientation and confusion.    The patient's driving activities have been significantly reduced, limited to short distances and familiar routes. Although she has had no accidents, she previously became lost while driving to a familiar location to exchange her grandchildren--something she had done numerous times in the past. Given these difficulties, she is no longer responsible for these pickups.    The patient acknowledges episodes of asking repetitive questions and forgetting prior conversations. Despite medications, the past six to nine months have been marked by continued episodes of emotional distress, including heightened anxiety and crying spells, though some improvement has been noted more recently.    Additional Health Concerns    The patient denies tremors, changes in gait, motor difficulties, or visual/auditory hallucinations. However,  she experiences persistent sleep difficulties, including trouble falling asleep and an irregular sleep schedule. She reports staying up until 1-2 AM and waking at varying times, sometimes as late as 10:30 AM. She does not nap during the day.    Family members have observed significant snoring, raising concerns about possible obstructive sleep apnea. Although a formal sleep study has been recommended, it has not yet been scheduled. Given its potential impact on cognitive functioning, the importance of follow-up was emphasized.  Note 12/17/2022:  Patient met classification for severe obstructive sleep apnea and advised treatment of positive airway pressure device with plan to proceed with AutoPap titration/trial at home.  Additionally, the patient has reported dietary changes and a reduced food intake. Recommendations were made to improve her diet with nutrient-rich foods such as vegetables, whole grains, nuts, and seeds while minimizing processed foods and refined carbohydrates.    Impression/Diagnosis:   The results of the current neuropsychological evaluation are consistent with multiple cognitive domains being impacted.  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  However, interpretation is guarded given the significant amount of anxiety and emotional distress, frustration noted on objective neuropsychological test data.  There was no indication of lateralization of findings or suggestions of significant cerebrovascular disease including significant microvascular ischemic changes.  The patient has had findings on HTN blood work profile consistent with those seen with individuals with Alzheimer's type pathology.  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  As far as diagnostic considerations, the most likely cause for her progressive cognitive  decline would be an Alzheimer's type pathology although the patient has now been diagnosed with severe obstructive sleep apnea.  She has begun using CPAP consistently after initial  hesitancy.  The patient notes that she is sleeping better but continues to describe significant cognitive deficits and memory deficits.  It will be important to continue to monitor after several months of consistent CPAP use to assess how much her severe obstructive sleep apnea was playing a role in both her progressive cognitive decline and emotional dysregulation as untreated severe obstructive sleep apnea can have a significant deleterious impact on these domains.  I will sit down with the patient and her family and go over the results of the current neuropsychological evaluation.  The patient has been placed on citalopram  to aid with mood dysregulation and continues to take benazepril and memantine  without significant side effects.  I will go over more specific instructions for the patient and her family outside of ongoing neurologic care.  It is very important that the patient continue to actively use her CPAP device as untreated sleep apnea is associated with faster progression of Alzheimer's type pathology.  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 Clinical Neuropsychologist

## 2023-08-05 ENCOUNTER — Ambulatory Visit (INDEPENDENT_AMBULATORY_CARE_PROVIDER_SITE_OTHER): Payer: Medicaid Other | Admitting: Neurology

## 2023-08-05 ENCOUNTER — Encounter: Payer: Self-pay | Admitting: Neurology

## 2023-08-05 VITALS — BP 118/72 | Ht 63.0 in | Wt 153.0 lb

## 2023-08-05 DIAGNOSIS — F02B4 Dementia in other diseases classified elsewhere, moderate, with anxiety: Secondary | ICD-10-CM | POA: Diagnosis not present

## 2023-08-05 DIAGNOSIS — F0280 Dementia in other diseases classified elsewhere without behavioral disturbance: Secondary | ICD-10-CM | POA: Insufficient documentation

## 2023-08-05 DIAGNOSIS — G300 Alzheimer's disease with early onset: Secondary | ICD-10-CM

## 2023-08-05 DIAGNOSIS — G309 Alzheimer's disease, unspecified: Secondary | ICD-10-CM | POA: Diagnosis not present

## 2023-08-05 DIAGNOSIS — F03918 Unspecified dementia, unspecified severity, with other behavioral disturbance: Secondary | ICD-10-CM

## 2023-08-05 NOTE — Progress Notes (Signed)
 Patient: Adriana Ramsey  Date of Birth: [birth year]  Reason for Visit: Follow up History from: Patient, husband, [name].  Primary Neurologist: Chima/Athar   ASSESSMENT AND PLAN 58 y.o. year old female   1.  Alzheimer's Disease with mood/behavioral disturbance  -MOCA 18/30  -A+T+N+, E3/E4 -Amyloid PET scan positive  -Discussed Leqembi and Kisunla at length, information given to patient -Check MRI brain for baseline before proceeding with anti-amyloid therapy -Neuropsych with Dr. Corina was consistent with major neurocognitive impairment Alzheimer's Disease  -Continue Aricept , Namenda , Celexa  -Have recommended against driving -Have recommended brain stimulating activity, exercise, healthy eating, management of vascular risk factors  2.  OSA on CPAP (HST 12/17/2022 that showed severe OSA AHI 30.6/hour and O2 nadir of 77%. Setup 01/27/23) -Suboptimal compliance -Have discussed the importance of nightly usage for minimum 4 hours  Follow up in 6 months with Dr. Buck to rotate every few visits with me.  HISTORY OF PRESENT ILLNESS: Today 08/05/23 A+T+N+, E3/E4, Amyloid PET scan positive. Repeat MRI brain was ordered, has not been completed.  Neuropsych evaluation with Dr. Corina 07/17/23 was consistent with major neurocognitive disorder likely Alzheimer's pathology. Here with her husband. MOCA 18/30. She is aware of this, and has been praying about her condition. Does her own ADLs. No driving, doesn't wander. Her husband had to stop working to care of her. He has been out a year taking care of her. She gets disability. Does some laundry, housework. Mood is okay. Remains on Aricept , Namenda , Celexa . CPAP compliance is suboptimal. We called her daughter. Functional Activities Questionnaire 15.  03/25/23 SS: Had HST 12/17/2022 that showed severe OSA AHI 30.6/hour and O2 nadir of 77%. Setup 01/27/23. Struggling to adjust. Compliance is not great, greater than 4-hour usage is 20%, AHI  6.5, leak 34. Doesn't sleep well with it on when she uses it all night, the next day she is calmer, not as irritable. Memory issues are about the same. Using nasal pillow mask. Husband helps her put it on at night, husband will hear it at night blowing. Continues on Celexa  10 mg daily, continues to be emotional, crying. On Aricept  and Namenda . Mentions some issues with out of state Medicaid.   Update 01/15/23 SS: Saw Dr. Corina in August 2024 for neuropsych consultation.  Had testing a few days later, results will be shared May 2025?  Had HST 12/17/2022 that showed severe OSA AHI 30.6/hour and O2 nadir of 77%.  Has not started CPAP yet.. Today MOCA 20/30. On bad days, emotional, easily irritable, poor memory. Is tearful today. Today is emotional, crying about her memory. Her husband has taken permanent leave of absence. Feels a weight on her family. Does her own ADLs. She helps with household tasks. Doesn't drive often, drives to her moms house down the street. Her son lives with her. Has been out of Celexa  for several months, unclear about Aricept  and Namenda .   Update 06/05/22 SS: MRI of the brain showed minor age-related changes.  Never underwent neuropsychological evaluation or sleep consult.  Had sent MyChart message in April 2024 daughter reporting extreme anxiety, crying.  She was started on Celexa . Apparently her daughter tried to set up her neuro psy testing and she didn't want to go. Didn't want to do sleep study, she snores. MOCA 20/30 today.  Has her moments with her memory, because she knows its not as good. Husband manages appointments/medications. She asks the same questions. Her husband has taken some time off due to patient's emotional issues, she  was calling him all the time. She does her ADLs, she does laundry, cooking. She is driving, she gets lost. On Aricept  5 mg daily, Namenda  10 mg daily. We are not sure why the Aricept  got lowered from 10 to 5. 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 We called her daughter on Face time, [name].   HISTORY  11/20/21 Dr. Rush Brief HPI: 58 year old female with a history of HTN who follows in clinic for memory loss.    At her last visit, brain MRI was ordered. She was referred to neuropsychology. Referral to sleep was placed to assess for OSA. She was started on donepezil  for memory loss.   Interval History: She has personally not noticed any difference in her memory, however her family states she has continued to decline. She has not noticed any improvement in her memory since starting donepezil . She repeats the same questions within minutes of each other. Will forget appointments. Has left the stove on multiple times. Anxiety seems worse as well, and she has been crying daily. She has been more irritable toward her family.  REVIEW OF SYSTEMS: Out of a complete 14 system review of symptoms, the patient complains only of the following symptoms, and all other reviewed systems are negative.  See HPI  ALLERGIES: Allergies  Allergen Reactions   Elemental Sulfur Swelling   Ibuprofen Itching   Penicillins Hives and Swelling    HOME MEDICATIONS: Outpatient Medications Prior to Visit  Medication Sig Dispense Refill   amLODipine (NORVASC) 5 MG tablet Take 5 mg by mouth daily.     citalopram  (CELEXA ) 20 MG tablet Take 1 tablet (20 mg total) by mouth daily. 30 tablet 5   donepezil  (ARICEPT ) 10 MG tablet TAKE 1 TABLET BY MOUTH EVERYDAY AT BEDTIME 90 tablet 1   hydrochlorothiazide  (HYDRODIURIL ) 25 MG tablet Take 1 tablet (25 mg total) daily by mouth. 90 tablet 2   memantine  (NAMENDA ) 10 MG tablet Take 1 tablet (10 mg total) by mouth 2 (two) times daily. 180 tablet 1   No facility-administered medications prior to visit.    PAST MEDICAL HISTORY: Past Medical History:  Diagnosis Date   Essential hypertension    Fatty liver    Gallbladder polyp 2012   Last ultrasound 2015, no evidence of gallbladder  polyp   GERD (gastroesophageal reflux disease)    Memory loss    PUD (peptic ulcer disease) 2011   Seasonal allergies     PAST SURGICAL HISTORY: Past Surgical History:  Procedure Laterality Date   BREAST REDUCTION SURGERY  2007   CESAREAN SECTION     2   COLONOSCOPY N/A 03/14/2016   Procedure: COLONOSCOPY;  Surgeon: [name], MD;  mild diverticulosis in the sigmoid and descending colon, redundant rectosigmoid colon, external and internal hemorrhoids.  Due for repeat colonoscopy in 2028.    ESOPHAGOGASTRODUODENOSCOPY  11/23/2009   NUR: hypopharyngeal erosions of unclear sidnificance/mild changes of reflux /small sliding hiatal hernia and non Schatzki ring/3-mm gastric ulcer along the antrum with 2 scars and few erosions. h.pyori serologies negative   ESOPHAGOGASTRODUODENOSCOPY N/A 04/21/2019   Procedure: ESOPHAGOGASTRODUODENOSCOPY (EGD);  Surgeon: Haddock Margo LITTIE, MD;  Moderate Schatzki's ring s/p dilation, small hiatal hernia, otherwise normal exam.   PARTIAL HYSTERECTOMY     REDUCTION MAMMAPLASTY Bilateral    SAVORY DILATION N/A 04/21/2019   Procedure: SAVORY DILATION;  Surgeon: Haddock Margo LITTIE, MD;  Location: [facility];  Service: Endoscopy;  Laterality: N/A;    FAMILY HISTORY: Family History  Problem Relation Age of Onset   Hyperlipidemia Mother    Seizures Mother    Hypertension Father    Heart attack Maternal Grandfather        51   Colon cancer Neg Hx    Liver disease Neg Hx     SOCIAL HISTORY: Social History   Socioeconomic History   Marital status: Married    Spouse name: Not on file   Number of children: 2   Years of education: Not on file   Highest education level: Some college, no degree  Occupational History   Occupation: Research scientist (medical): [employer]  Tobacco Use   Smoking status: Never   Smokeless tobacco: Never  Vaping Use   Vaping status: Never Used  Substance and Sexual Activity   Alcohol use: Yes    Comment: very  rare   Drug use: No   Sexual activity: Yes  Other Topics Concern   Not on file  Social History Narrative   Lives w/ husband w/ one of their children.    Right-handed.   Caffeine use: one cup per day.    Social Drivers of Corporate investment banker Strain: Not on file  Food Insecurity: Not on file  Transportation Needs: Not on file  Physical Activity: Not on file  Stress: Not on file  Social Connections: Not on file  Intimate Partner Violence: Not on file   PHYSICAL EXAM  Vitals:   08/05/23 1112  BP: 118/72  Weight: 153 lb (69.4 kg)  Height: 5' 3 (1.6 m)   Body mass index is 27.1 kg/m.    08/05/2023   11:13 AM 01/15/2023   10:04 AM 06/05/2022    2:05 PM 08/27/2021   10:42 AM 06/15/2021   10:08 AM  Montreal Cognitive Assessment   Visuospatial/ Executive (0/5) 2 3 3 2 2   Naming (0/3) 3 3 3 3 3   Attention: Read list of digits (0/2) 2 2 1 2 2   Attention: Read list of letters (0/1) 1 1 1 1 1   Attention: Serial 7 subtraction starting at 100 (0/3) 1 1 3 3 3   Language: Repeat phrase (0/2) 2 2 2 2 2   Language : Fluency (0/1) 1 1 1 1 1   Abstraction (0/2) 2 1 1 2 1   Delayed Recall (0/5) 0 0 0 0 0  Orientation (0/6) 4 6 5 6 5   Total 18 20 20 22 20   Adjusted Score (based on education)   20  20   Generalized: Well developed, in no acute distress  Neurological examination  Mentation: Alert oriented to time, place, is a limited historian doesn't go into detail, husband provides most information. At times tearful. Follows all commands speech and language fluent Cranial nerve II-XII: Pupils were equal round reactive to light. Extraocular movements were full, visual field were full on confrontational test. Facial sensation and strength were normal. Head turning and shoulder shrug  were normal and symmetric. Motor: The motor testing reveals 5 over 5 strength of all 4 extremities. Good symmetric motor tone is noted throughout.  Sensory: Sensory testing is intact to soft touch on all 4  extremities. No evidence of extinction is noted.  Coordination: Cerebellar testing reveals good finger-nose-finger and heel-to-shin bilaterally.  Gait and station: Gait is normal.  Reflexes: Deep tendon reflexes are symmetric and normal bilaterally.   DIAGNOSTIC DATA (LABS, IMAGING, TESTING) - I reviewed patient records, labs, notes, testing  and imaging myself where available.  Lab Results  Component Value Date   WBC 3.8 (L) 01/22/2022   HGB 14.6 01/22/2022   HCT 44.9 01/22/2022   MCV 84.9 01/22/2022   PLT 324 01/22/2022      Component Value Date/Time   NA 136 01/22/2022 1559   K 2.8 (L) 01/22/2022 1559   CL 97 (L) 01/22/2022 1559   CO2 30 01/22/2022 1559   GLUCOSE 93 01/22/2022 1559   BUN 11 01/22/2022 1559   CREATININE 0.71 01/22/2022 1559   CREATININE 0.85 03/25/2017 0913   CALCIUM 9.7 01/22/2022 1559   PROT 7.7 03/25/2017 0913   ALBUMIN 4.5 09/06/2016 0848   AST 30 03/25/2017 0913   ALT 32 (H) 03/25/2017 0913   ALKPHOS 59 09/06/2016 0848   BILITOT 0.7 03/25/2017 0913   GFRNONAA >60 01/22/2022 1559   GFRNONAA 78 09/06/2016 0848   GFRAA >89 09/06/2016 0848   Lab Results  Component Value Date   CHOL 189 03/25/2017   HDL 44 (L) 03/25/2017   LDLCALC 117 (H) 03/25/2017   TRIG 165 (H) 03/25/2017   CHOLHDL 4.3 03/25/2017   Lab Results  Component Value Date   HGBA1C 5.8 (H) 02/07/2016   Lab Results  Component Value Date   VITAMINB12 472 06/15/2021   Lab Results  Component Value Date   TSH 1.92 02/07/2016   Lauraine Born, AGNP-C, DNP 08/05/2023, 11:22 AM [facility] [address] [place], [place] [address] [phone]

## 2023-08-05 NOTE — Progress Notes (Signed)
  I reviewed the above note and documentation by the Nurse Practitioner and agree with the history, exam, assessment and plan as outlined above. I was available for consultation.We will initiate further w/u and paperwork for possible Kisunla IV for dementia.   True Mar, MD, PhD [facility])

## 2023-08-05 NOTE — Patient Instructions (Addendum)
 Patient Information about Leqembi and Kisunla  Leqembi(Lecanemab) and Kisunla(Donanemab) are both indicated for the treatment of Alzheimer's disease and are monoclonal antibodies directed against amyloid beta plaques.  There are no head to head trials comparing both medications. The trials measured different endpoints.   Removal of plaque appears strong in both; while slightly different mechanisms of action both clear plaques and appear to have an effect on other biomarkers.  Ongoing data analysis supports that anti-amyloid therapies provide modest clinical benefit.  There is no head-to-head data to compare these two therapies any comparisons are not definitive especially with effectiveness given different trial designs, different populations.  Leqembi targets protofibrils (small chains of linked up beta-amyloid proteins), preventing them to form, thus preventing amyloid plaques from developing. Kisunla targets beta amyloid plaques.   Leqembi   Leqembi looked at CDR (Clinical Dementia Rating) which, is the gold standard for AD staging and progression. Leqembi slowed progression 0-18 months by 27%. 95% of patients chose to extend their treatment out to 3 years. Of patients taking Leqembi 16% were APOE E4 homozygotes (E4/E4), 53% were heterozygotes (E3/E4), 31% noncarriers.   With Leqembi, ARIA was higher in ApoE ?4 homozygotes (Leqembi: 45%; placebo: 22%) than in heterozygotes (LEQEMBI: 19%; placebo: 9%) and noncarriers (Leqembi: 13%; placebo: 4%). Symptomatic ARIA-E occurred in 9% of ApoE ?4 homozygotes vs 2% of heterozygotes and 1% of noncarriers. Serious ARIA events occurred in 3% of ApoE ?4 homozygotes and in ~1% of heterozygotes and noncarriers. The recommendations on management of ARIA do not differ between ApoE ?4 carriers and noncarriers.   The most common adverse reactions reported in >=5% with Leqembi and >=2% higher than placebo were IRRs (Leqembi: 26%; placebo: 7%), ARIA-H (Leqembi: 14%;  placebo: 8%), ARIA-E (Leqembi: 13%; placebo: 2%), headache (Leqembi: 11%; placebo: 8%), superficial siderosis of central nervous system (Leqembi: 6%; placebo: 3%), rash (Leqembi: 6%; placebo: 4%), and nausea/vomiting (Leqembi: 6%; placebo: 4%).  Kisunla  Kisunla measured iADRS. 37% reduced risk of progressing to the next stage of disease vs placebo through 76 weeks   The risk of ARIA, including symptomatic and serious ARIA, is increased in apolipoprotein E ?4 (ApoE ?4) homozygotes. 17% (143/850) of patients in the Kisunla arm were ApoE ?4 homozygotes, 53% (452/850) were heterozygotes, and 30% (255/850) were noncarriers. The incidence of ARIA was higher in ApoE ?4 homozygotes (Kisunla: 55%; placebo: 22%) than in heterozygotes (Kisunla: 36%; placebo: 13%) and noncarriers (Kisunla: 25%; placebo: 12%). Among patients treated with Kisunla, symptomatic ARIA-E occurred in 8% of ApoE ?4 homozygotes compared with 7% of heterozygotes and 4% of noncarriers. Serious events of ARIA occurred in 3% of ApoE ?4 homozygotes, 2% of heterozygotes, and 1% of noncarriers.    After 18 months in a clinical study, people treated with Celedonio showed a significant slowing of decline of 22%, on average, compared with placebo.  Adverse Reactions: The most common adverse reactions reported in >=5% of patients treated with Kisunla (n=853) and >=2% higher than placebo (n=874): ARIA-H microhemorrhage (25% vs 11%), ARIA-E (24% vs 2%), ARIA-H superficial siderosis (15% vs 3%), headache (13% vs 10%), IRRs (9% vs 0.5%).  Monitoring for ARIA Obtain a recent baseline brain magnetic resonance imaging (MRI) prior to initiating treatment with KISUNLA. Obtain an MRI prior to the 2nd, 3rd, 4th, and 7th infusions. If a patient experiences symptoms suggestive of ARIA, clinical evaluation should be performed, including an MRI if indicated. It's possible to stop taking Kisunla as early as 6 months Your doctor may consider stopping your Kisunla  treatment if your amyloid plaques are reduced to a minimal level In a clinical study, 17% of people were able to stop taking Kisunla at 6 months, 47% at 12 months, and 69% at 18 months if their amyloid plaques were reduced to a predefined level. PET scans were used to determine amyloid levels in the study. People who were able to stop treatment were switched to placebo     Key differences between the medications:  Leqembi: - Every 2 weeks, new maintenance dosing after 18 months monthly  - Is weight based  - Dosing is indefinite - Memory testing score 22-30 - The studies did not repeat CT amyloid PET - Primary endpoints were different, primary endpoint of this medication was the Clinical Dementia Rating Scale at 18 months which showed decrease of 27% progression compared to placebo - Different mechanism, stops plaques from forming, beginning to form and prevents further aggregation, may be more effective in earlier stages of disease - Increased risk of ARIA for homozygote E4   Kisunla:  - Monthly - Set dose regardless of weight - Memory score testing 18-28 (includes lower memory score than Leqembi) - Patients may be able to stop infusions as studies did show overall a high percentage met criteria to stop at 12 months visually by PET/CT.  However no guidelines on how soon toxic species that are unmeasured bounce back after cessation of treatment and what are the markers of disease/treatment monitoring  - Primary endpoint was Integrated Alzheimer's Disease rating scale at 18 months which is more comprehensive as it test both cognitive and functional aspects of Alzheimer's disease and showed approximately 35% decrease in cognitive decline as compared to placebo - Different mechanism interrupts plaques/aggregated more mature form of plaques so may be more beneficial in later stages of disease - Significantly increased risk of ARIA as compared to Leqembi especially for E4 homozygotes.   - Given  their increased risk of ARIA there is more intense MRI monitoring  Limited data are available to guide individual-level decisions regarding thrombolysis, but dictate vigilant scrutiny for ARIA as the potential cause of stroke-like symptoms. Risk of thrombolysis-related ICH might be stratified in the acute care setting by an individual's likelihood of ARIA as determined by factors such as recency of immunotherapy initiation, APOE genotype, and baseline CAA markers (Table 2). Review of recent monitoring MRIs and performance of immediate MRI are reasonable approaches to identifying the presence of ARIA and its possible presentation as an acute stroke mimic. Pending further data demonstrating the safety of thrombolysis in this setting, treating clinicians might reasonably consider a high level of caution or avoidance of thrombolytic use in patients receiving immunotherapy. Mechanical thrombectomy without thrombolytics for patients with established clinical indications is likely safe and should be performed. American [employer] Association Dec 2024

## 2023-08-11 ENCOUNTER — Telehealth: Payer: Self-pay | Admitting: Neurology

## 2023-08-11 NOTE — Telephone Encounter (Signed)
 Modesto barrows: [id number] exp. 08/11/23-10/10/23 sent to [facility] [phone]

## 2023-08-22 ENCOUNTER — Ambulatory Visit (HOSPITAL_COMMUNITY)
Admission: RE | Admit: 2023-08-22 | Discharge: 2023-08-22 | Disposition: A | Discharge status: Home / Self Care | Source: Ambulatory Visit | Attending: Neurology | Admitting: Neurology

## 2023-08-22 DIAGNOSIS — G309 Alzheimer's disease, unspecified: Secondary | ICD-10-CM | POA: Diagnosis present

## 2023-08-22 DIAGNOSIS — F02818 Dementia in other diseases classified elsewhere, unspecified severity, with other behavioral disturbance: Secondary | ICD-10-CM | POA: Diagnosis not present

## 2023-08-22 DIAGNOSIS — F03918 Unspecified dementia, unspecified severity, with other behavioral disturbance: Secondary | ICD-10-CM

## 2023-08-27 ENCOUNTER — Ambulatory Visit: Payer: Self-pay | Admitting: Neurology

## 2023-11-06 ENCOUNTER — Ambulatory Visit (HOSPITAL_COMMUNITY): Admitting: Psychiatry

## 2023-11-13 ENCOUNTER — Ambulatory Visit (HOSPITAL_COMMUNITY): Admitting: Psychiatry

## 2023-11-20 ENCOUNTER — Ambulatory Visit (HOSPITAL_COMMUNITY): Admitting: Psychiatry

## 2023-11-20 ENCOUNTER — Other Ambulatory Visit: Payer: Self-pay

## 2023-11-20 ENCOUNTER — Encounter (HOSPITAL_COMMUNITY): Payer: Self-pay | Admitting: Psychiatry

## 2023-11-20 VITALS — BP 136/86 | HR 64 | Ht 64.0 in | Wt 153.0 lb

## 2023-11-20 DIAGNOSIS — F330 Major depressive disorder, recurrent, mild: Secondary | ICD-10-CM

## 2023-11-20 DIAGNOSIS — F419 Anxiety disorder, unspecified: Secondary | ICD-10-CM

## 2023-11-20 MED ORDER — ESCITALOPRAM OXALATE 10 MG PO TABS: ORAL_TABLET | ORAL | 1 refills | Status: DC

## 2023-11-20 NOTE — Progress Notes (Signed)
 [facility] MD/PA/NP OP Progress Note  11/20/2023 2:22 PM Adriana Ramsey   MRN:  [id number]  Chief Complaint:  Chief Complaint  Patient presents with   Follow-up   HPI: Patient is a 58 year old African-American unemployed married female who was seen first time 6 months ago.  Patient accompanied with her husband.  Patient reported things are same and no changes.  She minimized her memory issues but husband is concerned as patient is not doing very well.  She get easily anxious, tearful, crying.  Most of her vision was obtained from her husband.  Husband reported that there was an episode in the grocery stores when patient was leaving the store without paying and believed she paid the merchandise.  Patient husband told it was embarrassing and he was able to take care of issue.  Patient is very sensitive describing about her symptoms.  She admitted memory problem but does not want to discuss more in detail.  She denies any suicidal thoughts, homicidal thoughts, paranoia or hallucinations but started to get tearful anxious and emotional when husband talking about patient's memory issues.  Patient is not driving and husband takes her to the doctor's appointment.  Patient feels she is a burden to her husband but husband do not concern taking care of the patient.  Patient did admitted having some argument when he asked her to use the CPAP which she does not use.  Patient does not leave the house without her husband. Her mother lives close by and she has 2 grandkids who are 61-year-old and 89 year old but they are busy in their life.  Her son who lives with them works from home.  Husband reported her appetite is okay and weight is unchanged from the past.  Recently had a visit with neurology and recent MoCA test shows mild deterioration of cognition.  Patient is taking Celexa  20 mg but husband believes it is not helping as patient continues to have this agreement, forgetfulness that results crying and  irritability.  Visit Diagnosis:    ICD-10-CM   1. MDD (major depressive disorder), recurrent episode, mild  F33.0 escitalopram (LEXAPRO) 10 MG tablet    2. Anxiety  F41.9 escitalopram (LEXAPRO) 10 MG tablet      Past Psychiatric History: Reviewed History of anxiety due to memory issues.  No history of suicidal attempt, inpatient, mania, psychosis or depression.  On Celexa  prescribed by neurology.  Past Medical History:  Past Medical History:  Diagnosis Date   Essential hypertension    Fatty liver    Gallbladder polyp 2012   Last ultrasound 2015, no evidence of gallbladder polyp   GERD (gastroesophageal reflux disease)    Memory loss    PUD (peptic ulcer disease) 2011   Seasonal allergies     Past Surgical History:  Procedure Laterality Date   BREAST REDUCTION SURGERY  2007   CESAREAN SECTION     2   COLONOSCOPY N/A 03/14/2016   Procedure: COLONOSCOPY;  Surgeon: [name], MD;  mild diverticulosis in the sigmoid and descending colon, redundant rectosigmoid colon, external and internal hemorrhoids.  Due for repeat colonoscopy in 2028.    ESOPHAGOGASTRODUODENOSCOPY  11/23/2009   NUR: hypopharyngeal erosions of unclear sidnificance/mild changes of reflux /small sliding hiatal hernia and non Schatzki ring/3-mm gastric ulcer along the antrum with 2 scars and few erosions. h.pyori serologies negative   ESOPHAGOGASTRODUODENOSCOPY N/A 04/21/2019   Procedure: ESOPHAGOGASTRODUODENOSCOPY (EGD);  Surgeon: Haddock Margo LITTIE, MD;  Moderate Schatzki's ring s/p dilation, small hiatal hernia, otherwise  normal exam.   PARTIAL HYSTERECTOMY     REDUCTION MAMMAPLASTY Bilateral    SAVORY DILATION N/A 04/21/2019   Procedure: SAVORY DILATION;  Surgeon: Harvey Margo CROME, MD;  Location: [facility];  Service: Endoscopy;  Laterality: N/A;    Family Psychiatric History: Reviewed  Family History:  Family History  Problem Relation Age of Onset   Hyperlipidemia Mother    Seizures Mother     Hypertension Father    Heart attack Maternal Grandfather        81   Colon cancer Neg Hx    Liver disease Neg Hx     Social History:  Social History   Socioeconomic History   Marital status: Married    Spouse name: Not on file   Number of children: 2   Years of education: Not on file   Highest education level: Some college, no degree  Occupational History   Occupation: Research Scientist (medical): software engineer  Tobacco Use   Smoking status: Never   Smokeless tobacco: Never  Vaping Use   Vaping status: Never Used  Substance and Sexual Activity   Alcohol use: Yes    Comment: very rare   Drug use: No   Sexual activity: Yes  Other Topics Concern   Not on file  Social History Narrative   Lives w/ husband w/ one of their children.    Right-handed.   Caffeine use: one cup per day.    Social Drivers of Corporate Investment Banker Strain: Not on file  Food Insecurity: Not on file  Transportation Needs: Not on file  Physical Activity: Not on file  Stress: Not on file  Social Connections: Not on file    Allergies:  Allergies  Allergen Reactions   Elemental Sulfur Swelling   Ibuprofen Itching   Penicillins Hives and Swelling    Metabolic Disorder Labs: Lab Results  Component Value Date   HGBA1C 5.8 (H) 02/07/2016   MPG 120 02/07/2016   MPG 128 (H) 06/21/2014   No results found for: PROLACTIN Lab Results  Component Value Date   CHOL 189 03/25/2017   TRIG 165 (H) 03/25/2017   HDL 44 (L) 03/25/2017   CHOLHDL 4.3 03/25/2017   VLDL 35 (H) 09/06/2016   LDLCALC 117 (H) 03/25/2017   LDLCALC 130 (H) 11/19/2016   Lab Results  Component Value Date   TSH 1.92 02/07/2016   TSH 3.86 09/20/2015    Therapeutic Level Labs: No results found for: LITHIUM No results found for: VALPROATE No results found for: CBMZ  Current Medications: Current Outpatient Medications  Medication Sig Dispense Refill   amLODipine (NORVASC) 5 MG tablet Take 5 mg  by mouth daily.     citalopram  (CELEXA ) 20 MG tablet Take 1 tablet (20 mg total) by mouth daily. 30 tablet 5   donepezil  (ARICEPT ) 10 MG tablet TAKE 1 TABLET BY MOUTH EVERYDAY AT BEDTIME 90 tablet 1   hydrochlorothiazide  (HYDRODIURIL ) 25 MG tablet Take 1 tablet (25 mg total) daily by mouth. 90 tablet 2   memantine  (NAMENDA ) 10 MG tablet Take 1 tablet (10 mg total) by mouth 2 (two) times daily. 180 tablet 1   No current facility-administered medications for this visit.     Musculoskeletal: Strength & Muscle Tone: within normal limits Gait & Station: normal Patient leans: N/A  Psychiatric Specialty Exam: Review of Systems  Blood pressure 136/86, pulse 64, height 5' 4 (1.626 m), weight 153 lb (69.4 kg).There is no height or  weight on file to calculate BMI.  General Appearance: Casual  Eye Contact:  Fair  Speech:  Slow  Volume:  Decreased  Mood:  Anxious, Dysphoric, and tearful  Affect:  Constricted and Depressed  Thought Process:  Descriptions of Associations: Intact  Orientation:  Full (Time, Place, and Person)  Thought Content: Rumination   Suicidal Thoughts:  No  Homicidal Thoughts:  No  Memory:  Immediate;   Fair Recent;   Fair Remote;   Fair  Judgement:  Fair  Insight:  Fair  Psychomotor Activity:  Decreased  Concentration:  Concentration: Fair and Attention Span: Fair  Recall:  [employer] of Knowledge: Fair  Language: Fair  Akathisia:  No  Handed:  Right  AIMS (if indicated): not done  Assets:  Communication Skills Desire for Improvement Housing Social Support  ADL's:  Intact  Cognition: Impaired,  Mild  Sleep:  fair, not using CPAP   Screenings: GAD-7    Flowsheet Row Office Visit [facility] 05/01/2023 in [facility]  Total GAD-7 Score 4   PHQ2-9    Flowsheet Row Office Visit from 05/01/2023 in [facility] Office Visit from 03/25/2017 in [facility] Office Visit from  01/22/2017 in [facility] Office Visit from 11/19/2016 in [facility] Office Visit from 02/07/2016 in [facility]  PHQ-2 Total Score 0 0 0 0 0  PHQ-9 Total Score -- -- -- 0 0   Flowsheet Row Office Visit from 05/01/2023 in [facility] ED from 01/22/2022 in [facility] Emergency Department at [facility]  C-SSRS RISK CATEGORY No Risk No Risk      08/05/2023   11:13 AM 01/15/2023   10:04 AM 06/05/2022    2:05 PM 08/27/2021   10:42 AM 06/15/2021   10:08 AM  Montreal Cognitive Assessment   Visuospatial/ Executive (0/5) 2 3 3 2 2   Naming (0/3) 3 3 3 3 3   Attention: Read list of digits (0/2) 2 2 1 2 2   Attention: Read list of letters (0/1) 1 1 1 1 1   Attention: Serial 7 subtraction starting at 100 (0/3) 1 1 3 3 3   Language: Repeat phrase (0/2) 2 2 2 2 2   Language : Fluency (0/1) 1 1 1 1 1   Abstraction (0/2) 2 1 1 2 1   Delayed Recall (0/5) 0 0 0 0 0  Orientation (0/6) 4 6 5 6 5   Total 18 20 20 22 20   Adjusted Score (based on education)   20  20     Assessment and Plan: Patient is 58 year old African-American female with history of Alzheimer's and gradually declining in memory.  She has seen by Dr. Corina and recently neurology.  Recent MoCA test show 18 which is slightly declined from 20.  Patient has no longer visit scheduled with Dr. Corina.  Husband concerned about anxiety depression and some time irritability.  Patient does not provide much information and most of the information is obtained from the husband.  Husband does not believe current dose of Celexa  working.  Will trial Lexapro 5 mg daily for 1 week and then 10 mg daily.  I explained cut down the Celexa  half tablet for 1 week and then discontinued.  Discussed medication side effects and benefits.  I also encouraged therapy but patient and her husband does not feel it is necessary.  I will follow-up in 8 weeks.  Encouraged to call back if  they  have any question, concern.  Collaboration of Care: Collaboration of Care: Other provider involved in patient's care AEB notes are available in epic to review  Patient/Guardian was advised Release of Information must be obtained prior to any record release in order to collaborate their care with an outside provider. Patient/Guardian was advised if they have not already done so to contact the registration department to sign all necessary forms in order for us  to release information regarding their care.   Consent: Patient/Guardian gives verbal consent for treatment and assignment of benefits for services provided during this visit. Patient/Guardian expressed understanding and agreed to proceed.    Leni ONEIDA Client, MD 11/20/2023, 2:22 PM

## 2024-01-22 ENCOUNTER — Ambulatory Visit (HOSPITAL_COMMUNITY): Admitting: Psychiatry

## 2024-01-22 ENCOUNTER — Other Ambulatory Visit (HOSPITAL_COMMUNITY): Payer: Self-pay | Admitting: *Deleted

## 2024-01-22 DIAGNOSIS — F419 Anxiety disorder, unspecified: Secondary | ICD-10-CM

## 2024-01-22 DIAGNOSIS — F330 Major depressive disorder, recurrent, mild: Secondary | ICD-10-CM

## 2024-01-22 MED ORDER — ESCITALOPRAM OXALATE 10 MG PO TABS: ORAL_TABLET | ORAL | 0 refills | Status: DC

## 2024-02-05 ENCOUNTER — Ambulatory Visit (HOSPITAL_COMMUNITY): Admitting: Psychiatry

## 2024-02-05 ENCOUNTER — Other Ambulatory Visit: Payer: Self-pay

## 2024-02-05 ENCOUNTER — Encounter (HOSPITAL_COMMUNITY): Payer: Self-pay | Admitting: Psychiatry

## 2024-02-05 VITALS — BP 132/79 | HR 74 | Ht 63.0 in | Wt 157.0 lb

## 2024-02-05 DIAGNOSIS — F419 Anxiety disorder, unspecified: Secondary | ICD-10-CM | POA: Diagnosis not present

## 2024-02-05 DIAGNOSIS — F330 Major depressive disorder, recurrent, mild: Secondary | ICD-10-CM

## 2024-02-05 MED ORDER — ESCITALOPRAM OXALATE 10 MG PO TABS: ORAL_TABLET | ORAL | 0 refills | Status: AC

## 2024-02-05 NOTE — Progress Notes (Signed)
 [facility] MD/PA/NP OP Progress Note  02/05/2024 1:32 PM Adriana Ramsey   MRN:  [id number]  Chief Complaint:  Chief Complaint  Patient presents with   Follow-up   HPI: Patient came today for her follow-up appointment with her husband.  We started her on Lexapro  and she is taking 10 mg.  She is no longer taking Celexa .  She reported holidays were good and she enjoyed the company of the 60-year-old and 108 year old grandkid.  Husband reported that she is not as anxious depressed and sad.  Less crying and less emotional.  So far tolerating medication and reported no side effects.  Patient reported husband is very caring and make sure she takes the medication.  She reported since taking the medication regularly her mood is better.  She still struggle some time with memory but no major issue.  She does go outside by herself sometimes but not too far.  Usually she enjoys the company of her husband who also takes her to the grocery stores.  Patient has appointment coming up to see neurology in few weeks.  Her appetite is okay.  Her sleep is better since using the CPAP more frequently.  She has no tremor or shakes or any EPS.  She denies any hallucination, paranoia or any suicidal thoughts.  She denies any panic attack.  She is not worried about her health as much and does not get upset irritable.  Husband reported not having any argument or irritability.  Her appetite is okay and her weight is stable.  Visit Diagnosis:    ICD-10-CM   1. MDD (major depressive disorder), recurrent episode, mild  F33.0 escitalopram  (LEXAPRO ) 10 MG tablet    2. Anxiety  F41.9 escitalopram  (LEXAPRO ) 10 MG tablet       Past Psychiatric History: Reviewed History of anxiety due to memory issues.  No history of suicidal attempt, inpatient, mania, psychosis or depression.  On Celexa  prescribed by neurology.  Past Medical History:  Past Medical History:  Diagnosis Date   Essential hypertension    Fatty liver    Gallbladder  polyp 2012   Last ultrasound 2015, no evidence of gallbladder polyp   GERD (gastroesophageal reflux disease)    Memory loss    PUD (peptic ulcer disease) 2011   Seasonal allergies     Past Surgical History:  Procedure Laterality Date   BREAST REDUCTION SURGERY  2007   CESAREAN SECTION     2   COLONOSCOPY N/A 03/14/2016   Procedure: COLONOSCOPY;  Surgeon: [name], MD;  mild diverticulosis in the sigmoid and descending colon, redundant rectosigmoid colon, external and internal hemorrhoids.  Due for repeat colonoscopy in 2028.    ESOPHAGOGASTRODUODENOSCOPY  11/23/2009   NUR: hypopharyngeal erosions of unclear sidnificance/mild changes of reflux /small sliding hiatal hernia and non Schatzki ring/3-mm gastric ulcer along the antrum with 2 scars and few erosions. h.pyori serologies negative   ESOPHAGOGASTRODUODENOSCOPY N/A 04/21/2019   Procedure: ESOPHAGOGASTRODUODENOSCOPY (EGD);  Surgeon: Haddock Margo LITTIE, MD;  Moderate Schatzki's ring s/p dilation, small hiatal hernia, otherwise normal exam.   PARTIAL HYSTERECTOMY     REDUCTION MAMMAPLASTY Bilateral    SAVORY DILATION N/A 04/21/2019   Procedure: SAVORY DILATION;  Surgeon: Haddock Margo LITTIE, MD;  Location: [facility];  Service: Endoscopy;  Laterality: N/A;    Family Psychiatric History: Reviewed  Family History:  Family History  Problem Relation Age of Onset   Hyperlipidemia Mother    Seizures Mother    Hypertension Father  Heart attack Maternal Grandfather        72   Colon cancer Neg Hx    Liver disease Neg Hx     Social History:  Social History   Socioeconomic History   Marital status: Married    Spouse name: Not on file   Number of children: 2   Years of education: Not on file   Highest education level: Some college, no degree  Occupational History   Occupation: Research Scientist (medical): [employer]  Tobacco Use   Smoking status: Never   Smokeless tobacco: Never  Vaping Use   Vaping status:  Never Used  Substance and Sexual Activity   Alcohol use: Yes    Comment: very rare   Drug use: No   Sexual activity: Yes  Other Topics Concern   Not on file  Social History Narrative   Lives w/ husband w/ one of their children.    Right-handed.   Caffeine use: one cup per day.    Social Drivers of Health   Tobacco Use: Low Risk (02/05/2024)   Patient History    Smoking Tobacco Use: Never    Smokeless Tobacco Use: Never    Passive Exposure: Not on file  Financial Resource Strain: Not on file  Food Insecurity: Not on file  Transportation Needs: Not on file  Physical Activity: Not on file  Stress: Not on file  Social Connections: Not on file  Depression (PHQ2-9): Low Risk (05/01/2023)   Depression (PHQ2-9)    PHQ-2 Score: 0  Alcohol Screen: Not on file  Housing: Not on file  Utilities: Not on file  Health Literacy: Not on file    Allergies:  Allergies  Allergen Reactions   Elemental Sulfur Swelling   Ibuprofen Itching   Penicillins Hives and Swelling    Metabolic Disorder Labs: Lab Results  Component Value Date   HGBA1C 5.8 (H) 02/07/2016   MPG 120 02/07/2016   MPG 128 (H) 06/21/2014   No results found for: PROLACTIN Lab Results  Component Value Date   CHOL 189 03/25/2017   TRIG 165 (H) 03/25/2017   HDL 44 (L) 03/25/2017   CHOLHDL 4.3 03/25/2017   VLDL 35 (H) 09/06/2016   LDLCALC 117 (H) 03/25/2017   LDLCALC 130 (H) 11/19/2016   Lab Results  Component Value Date   TSH 1.92 02/07/2016   TSH 3.86 09/20/2015    Therapeutic Level Labs: No results found for: LITHIUM No results found for: VALPROATE No results found for: CBMZ  Current Medications: Current Outpatient Medications  Medication Sig Dispense Refill   amLODipine (NORVASC) 5 MG tablet Take 5 mg by mouth daily.     donepezil  (ARICEPT ) 10 MG tablet TAKE 1 TABLET BY MOUTH EVERYDAY AT BEDTIME 90 tablet 1   escitalopram  (LEXAPRO ) 10 MG tablet Take 1 tablet (10 mg total dose) by mouth daily.  30 tablet 0   hydrochlorothiazide  (HYDRODIURIL ) 25 MG tablet Take 1 tablet (25 mg total) daily by mouth. 90 tablet 2   memantine  (NAMENDA ) 10 MG tablet Take 1 tablet (10 mg total) by mouth 2 (two) times daily. 180 tablet 1   No current facility-administered medications for this visit.     Musculoskeletal: Strength & Muscle Tone: within normal limits Gait & Station: normal Patient leans: N/A  Psychiatric Specialty Exam: Review of Systems  Blood pressure 132/79, pulse 74, height 5' 3 (1.6 m), weight 157 lb (71.2 kg).Body mass index is 27.81 kg/m.  General Appearance: Casual  Eye  Contact:  Fair  Speech:  Slow  Volume:  Decreased  Mood:  Euthymic  Affect:  Congruent  Thought Process:  Descriptions of Associations: Intact  Orientation:  Full (Time, Place, and Person)  Thought Content: WDL   Suicidal Thoughts:  No  Homicidal Thoughts:  No  Memory:  Immediate;   Fair Recent;   Fair Remote;   Fair  Judgement:  Fair  Insight:  Fair  Psychomotor Activity:  Decreased  Concentration:  Concentration: Fair and Attention Span: Fair  Recall:  [employer] of Knowledge: Fair  Language: Fair  Akathisia:  No  Handed:  Right  AIMS (if indicated): not done  Assets:  Communication Skills Desire for Improvement Housing Social Support  ADL's:  Intact  Cognition: Impaired,  Mild  Sleep:  better using CPAP   Screenings: GAD-7    Flowsheet Row Office Visit from 05/01/2023 in [facility]  Total GAD-7 Score 4   PHQ2-9    Flowsheet Row Office Visit from 05/01/2023 in [facility] Office Visit from 03/25/2017 in [facility] Office Visit from 01/22/2017 in [facility] Office Visit from 11/19/2016 in [facility] Office Visit from 02/07/2016 in [facility]  PHQ-2 Total Score 0 0 0 0 0  PHQ-9 Total Score -- -- -- 0 0   Flowsheet Row Office Visit from  05/01/2023 in [facility] ED from 01/22/2022 in [facility] Emergency Department at [facility]  C-SSRS RISK CATEGORY No Risk No Risk      08/05/2023   11:13 AM 01/15/2023   10:04 AM 06/05/2022    2:05 PM 08/27/2021   10:42 AM 06/15/2021   10:08 AM  Montreal Cognitive Assessment   Visuospatial/ Executive (0/5) 2 3 3 2 2   Naming (0/3) 3 3 3 3 3   Attention: Read list of digits (0/2) 2 2 1 2 2   Attention: Read list of letters (0/1) 1 1 1 1 1   Attention: Serial 7 subtraction starting at 100 (0/3) 1 1 3 3 3   Language: Repeat phrase (0/2) 2 2 2 2 2   Language : Fluency (0/1) 1 1 1 1 1   Abstraction (0/2) 2 1 1 2 1   Delayed Recall (0/5) 0 0 0 0 0  Orientation (0/6) 4 6 5 6 5   Total 18 20 20 22 20   Adjusted Score (based on education)   20  20     Assessment and Plan: Patient is 59 year old African-American female with Alzheimer's disease, major depressive disorder and anxiety.  Overall doing much better with Lexapro  10 mg.  She is no longer taking Celexa .  She has appointment coming up to see Dr. Corina.  Her last MoCA test was 18 which is slightly declined from 20.  Encouraged to keep appointment with neurologist.  Discussed medication side effects and benefits.  Recommend to call back if any question or any concern.  Follow-up in 3 months.   Collaboration of Care: Collaboration of Care: Other provider involved in patient's care AEB notes are available in epic to review  Patient/Guardian was advised Release of Information must be obtained prior to any record release in order to collaborate their care with an outside provider. Patient/Guardian was advised if they have not already done so to contact the registration department to sign all necessary forms in order for us  to release information regarding their care.   Consent: Patient/Guardian gives verbal consent for treatment and  assignment of benefits for services provided during this visit.  Patient/Guardian expressed understanding and agreed to proceed.    Leni ONEIDA Client, MD 02/05/2024, 1:32 PM

## 2024-03-01 ENCOUNTER — Ambulatory Visit: Admitting: Neurology

## 2024-03-04 ENCOUNTER — Ambulatory Visit: Admitting: Neurology

## 2024-05-06 ENCOUNTER — Ambulatory Visit (HOSPITAL_COMMUNITY): Admitting: Psychiatry
